# Patient Record
Sex: Male | Born: 1940
Health system: Southern US, Community
[De-identification: ages and names within clinical notes are randomized; demographics above are authoritative.]

## PROBLEM LIST (undated history)

## (undated) DIAGNOSIS — N400 Enlarged prostate without lower urinary tract symptoms: Secondary | ICD-10-CM

## (undated) DIAGNOSIS — I779 Disorder of arteries and arterioles, unspecified: Secondary | ICD-10-CM

## (undated) DIAGNOSIS — K635 Polyp of colon: Secondary | ICD-10-CM

## (undated) DIAGNOSIS — E782 Mixed hyperlipidemia: Secondary | ICD-10-CM

## (undated) DIAGNOSIS — I10 Essential (primary) hypertension: Secondary | ICD-10-CM

## (undated) DIAGNOSIS — I739 Peripheral vascular disease, unspecified: Secondary | ICD-10-CM

## (undated) DIAGNOSIS — K219 Gastro-esophageal reflux disease without esophagitis: Secondary | ICD-10-CM

## (undated) DIAGNOSIS — Z87442 Personal history of urinary calculi: Secondary | ICD-10-CM

## (undated) DIAGNOSIS — N402 Nodular prostate without lower urinary tract symptoms: Secondary | ICD-10-CM

## (undated) DIAGNOSIS — J449 Chronic obstructive pulmonary disease, unspecified: Secondary | ICD-10-CM

## (undated) DIAGNOSIS — E559 Vitamin D deficiency, unspecified: Secondary | ICD-10-CM

## (undated) DIAGNOSIS — M1990 Unspecified osteoarthritis, unspecified site: Secondary | ICD-10-CM

## (undated) DIAGNOSIS — I2510 Atherosclerotic heart disease of native coronary artery without angina pectoris: Secondary | ICD-10-CM

## (undated) DIAGNOSIS — G459 Transient cerebral ischemic attack, unspecified: Secondary | ICD-10-CM

## (undated) HISTORY — DX: Benign prostatic hyperplasia without lower urinary tract symptoms: N40.0

## (undated) HISTORY — DX: Essential (primary) hypertension: I10

## (undated) HISTORY — DX: Atherosclerotic heart disease of native coronary artery without angina pectoris: I25.10

## (undated) HISTORY — DX: Transient cerebral ischemic attack, unspecified: G45.9

## (undated) HISTORY — PX: EYE SURGERY: SHX253

## (undated) HISTORY — DX: Mixed hyperlipidemia: E78.2

## (undated) HISTORY — DX: Peripheral vascular disease, unspecified: I73.9

## (undated) HISTORY — DX: Nodular prostate without lower urinary tract symptoms: N40.2

## (undated) HISTORY — DX: Disorder of arteries and arterioles, unspecified: I77.9

## (undated) HISTORY — DX: Chronic obstructive pulmonary disease, unspecified: J44.9

## (undated) HISTORY — DX: Polyp of colon: K63.5

## (undated) HISTORY — DX: Vitamin D deficiency, unspecified: E55.9

## (undated) HISTORY — DX: Personal history of urinary calculi: Z87.442

---
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
## 1960-08-19 HISTORY — PX: OTHER SURGICAL HISTORY: SHX169

## 1966-08-19 HISTORY — PX: TONSILLECTOMY: SUR1361

## 1993-08-19 HISTORY — PX: ANGIOPLASTY: SHX39

## 2011-03-27 NOTE — Progress Notes (Signed)
[facility] [place]  New Carotid Patient  Referred by: Dr. Leodis Sias  Reason for referral: B carotid stenosis  History of Present Illness  Hector Torres is a 70 y.o. male who presents with chief complaint: B carotid stenosis.  Previous carotid studies demonstrated: RICA 50-69% stenosis, LICA 50-69% stenosis.  Patient has history of TIA 4-5 years ago notable for L hand weakness.  The patient has never  had amaurosis fugax or monocular blindness.  The patient has had transient right upper extremity plegia.  The patient has transient expressive aphasia.   The patient's previous neurologic deficits have resolved.  He denies any further episodes of this one event.  The patient denies any left arm complaints.  Past Medical History  Diagnosis Date  . Carotid stenosis, bilateral   . PVD (peripheral vascular disease)   . TIA (transient ischemic attack)   . Coronary artery disease   . Hypertension   . Hyperlipidemia   . Prostate nodule     Past Surgical History  Procedure Date  . Eye surgery     bilateral cataract  . Appendectomy 1947  . Tonsillectomy 1968  . Traumatic injury 1962    lost tip of index, middle, and ring finger/ right hand  . Balloon angioplasty, artery 1995    left circumflex artery     History   Social History  . Marital Status: Married    Spouse Name: N/A    Number of Children: N/A  . Years of Education: N/A   Occupational History  . Not on file.   Social History Main Topics  . Smoking status: Former Smoker -- 1.5 packs/day for 60 years    Types: Cigarettes    Quit date: 02/26/2011  . Smokeless tobacco: Not on file  . Alcohol Use: No  . Drug Use: Not on file  . Sexually Active: Not on file   Other Topics Concern  . Not on file   Social History Narrative  . No narrative on file    Family History  Problem Relation Age of Onset  . Cancer Mother     gastric  . Cancer Father     prostate  . Other Brother     carotid  artery stenosis  . Cancer Brother     leukemia  . Cancer Brother     hx of colon CA, and prostate Ca    Current outpatient prescriptions:acetaminophen (TYLENOL) 325 MG tablet, Take 650 mg by mouth every 6 (six) hours as needed.  , Disp: , Rfl: ;  aspirin 81 MG tablet, Take 81 mg by mouth daily.  , Disp: , Rfl: ;  carvedilol (COREG) 3.125 MG tablet, Take 3.125 mg by mouth 2 (two) times daily with a meal.  , Disp: , Rfl: ;  Cholecalciferol (VITAMIN D) 1000 UNITS capsule, Take 1,000 Units by mouth. Take 3 capsules daily , Disp: , Rfl:  Multiple Vitamin (MULTIVITAMIN PO), Take 1 tablet by mouth daily.  , Disp: , Rfl: ;  naproxen sodium (ANAPROX) 220 MG tablet, Take 220 mg by mouth. Take 2 tablets daily / as needed , Disp: , Rfl: ;  Omega-3 Fatty Acids (FISH OIL) 1000 MG CAPS, Take 1 capsule by mouth daily.  , Disp: , Rfl: ;  rosuvastatin (CRESTOR) 20 MG tablet, Take 20 mg by mouth daily.  , Disp: , Rfl: ;  vitamin E 400 UNIT capsule, Take 400 Units by mouth daily.  , Disp: , Rfl:  CRESTOR 10 MG  tablet, , Disp: , Rfl:   Allergies as of 03/29/2011  . (No Known Allergies)    Review of Systems (Positive items in bold and italic, otherwise negative)  General: Weight loss, Weight gain, Loss of appetite, Fever  Neurologic: Dizziness, Blackouts, Headaches, Seizure  Ear/Nose/Throat: Change in eyesight, Change in hearing, Nose bleeds, Sore throat  Vascular: Pain in legs with walking, Pain in feet while lying flat, Non-healing ulcer, Stroke, "Mini stroke", Slurred speech, Temporary blindness, Blood clot in vein, Phlebitis  Pulmonary: Home oxygen, Productive cough, Bronchitis, Coughing up blood, Asthma, Wheezing  Musculoskeletal: Arthritis, Joint pain, Muscle pain  Cardiac: Chest pain, Chest tightness/pressure, Shortness of breath when lying flat, Shortness of breath with exertion, Palpitations, Heart murmur, Arrythmia, Atrial fibrillation  Hematologic: Bleeding problems, Clotting disorder,  Anemia  Psychiatric:  Depression, Anxiety, Attention deficit disorder  Gastrointestinal:  Black stool, Blood in stool, Peptic ulcer disease, Reflux, Hiatal hernia, Trouble swallowing, Diarrhea, Constipation  Urinary:  Kidney disease, Burning with urination, Frequent urination, Difficulty urinating  Skin: Ulcers, Rashes  Physical Examination  Filed Vitals:   03/29/11 1212  BP: 179/89  Pulse:   Resp:    General: A&O x 3, WDWN  Head: [place]/AT  Ear/Nose/Throat: Hearing grossly intact, nares w/o erythema or drainage, oropharynx w/o Erythema/Exudate  Eyes: PERRLA, EOMI  Neck: Supple, no nuchal rigidity, no palpable LAD  Pulmonary: Sym exp, good air movt, CTAB, no rales, rhonchi, & wheezing  Cardiac: RRR, Nl S1, S2, no Murmurs, rubs or gallops  Vascular: Vessel Right Left  Radial Palpable Palpable  [place] Palpable Palpable  Brachial Palpable Palpable  Carotid Palpable, without bruit Palpable, without bruit  Aorta Non-palpable N/A  Femoral Palpable Palpable  Popliteal Non-palpable Non-palpable  PT Non-Palpable Non-Palpable  DP Weakly Palpable Weakly Palpable   Gastrointestinal: soft, NTND, -G/R, - HSM, - masses, - CVAT B  Musculoskeletal: M/S 5/5 throughout, Extremities without ischemic   Neurologic: CN 2-12 intact , Pain and light touch intact in extremities , Motor exam as listed above  Psychiatric: Judgment intact, Mood & affect appropriate for pt's clinical situation  Dermatologic: See M/S exam for extremity exam, no rashes otherwise noted  Lymph : No Cervical, Axillary, or Inguinal lymphadenopathy   Non-Invasive Vascular Imaging  CAROTID DUPLEX (Date: 03/29/11):   R ICA stenosis: 1-39%  R VA: patent and antegrade  L ICA stenosis: 40-59%  L VA:  patent and bidirectional  L SCA with 234 c/s velocity  Outside Studies/Documentation 10 pages of outside documents were reviewed including: carotid duplex report.  Medical Decision Making  Hector Torres is a  70 y.o. male who presents with: B ICA stenosis without evidence of L SCA steal.   Pt has not been sx for 4-5 years, so I would treat him as a patient with asx disease.  Neither side reaches the >80% threshold for repair.  I discussed in depth with the patient the nature of atherosclerosis, and emphasized the importance of maximal medical management including strict control of blood pressure, blood glucose, and lipid levels, obtaining regular exercise, and cessation of smoking.  The patient is aware that without maximal medical management the underlying atherosclerotic disease process will progress, limiting the benefit of any interventions.  I recommend annual B carotid duplex.  We will continue to follow this patient for his BICA stenosis.  Thank you for allowing [place] to participate in this patient's care.  Leonides Sake, MD [facility] [place] Office: [phone] Pager: [phone]

## 2011-03-29 ENCOUNTER — Encounter: Payer: Self-pay | Admitting: Vascular Surgery

## 2011-03-29 ENCOUNTER — Ambulatory Visit (INDEPENDENT_AMBULATORY_CARE_PROVIDER_SITE_OTHER): Payer: Medicare PPO | Admitting: Vascular Surgery

## 2011-03-29 ENCOUNTER — Other Ambulatory Visit (INDEPENDENT_AMBULATORY_CARE_PROVIDER_SITE_OTHER): Payer: Medicare PPO

## 2011-03-29 VITALS — BP 179/89 | HR 60 | Resp 20 | Ht 70.0 in | Wt 210.2 lb

## 2011-03-29 DIAGNOSIS — I6529 Occlusion and stenosis of unspecified carotid artery: Secondary | ICD-10-CM

## 2011-03-29 DIAGNOSIS — I6523 Occlusion and stenosis of bilateral carotid arteries: Secondary | ICD-10-CM

## 2011-04-12 NOTE — Procedures (Unsigned)
CAROTID DUPLEX EXAM  INDICATION:  Carotid disease.  HISTORY: Diabetes:  No. Cardiac:  No. Hypertension:  Yes. Smoking:  Previous. Previous Surgery:  No. CV History:  Currently asymptomatic. Amaurosis Fugax No, Paresthesias No, Hemiparesis No                                      RIGHT             LEFT Brachial systolic pressure:         182               162 Brachial Doppler waveforms:         Normal            Abnormal Vertebral direction of flow:        Antegrade         Bidirectional DUPLEX VELOCITIES (cm/sec) CCA peak systolic                   54                57 ECA peak systolic                   83                70 ICA peak systolic                   120               167 ICA end diastolic                   34                50 PLAQUE MORPHOLOGY:                  Heterogeneous     Heterogeneous PLAQUE AMOUNT:                      Mild              Moderate PLAQUE LOCATION:                    ICA / ECA         ICA / ECA / CCA  IMPRESSION: 1. Doppler velocities suggest a 1%-39% stenosis of the right proximal     internal carotid artery and 40%-59% stenosis of the left proximal     internal carotid artery. 2. Bidirectional left vertebral artery flow and velocity of 234 cm/s     of the left proximal subclavian artery are noted.  ___________________________________________ Fransisco Hertz, MD  CH/MEDQ  D:  03/29/2011  T:  03/29/2011  Job:  [id number]

## 2011-04-15 ENCOUNTER — Encounter: Payer: Self-pay | Admitting: Cardiology

## 2011-04-15 ENCOUNTER — Ambulatory Visit (INDEPENDENT_AMBULATORY_CARE_PROVIDER_SITE_OTHER): Payer: Medicare PPO | Admitting: Cardiology

## 2011-04-15 ENCOUNTER — Telehealth: Payer: Self-pay | Admitting: *Deleted

## 2011-04-15 ENCOUNTER — Encounter: Payer: Self-pay | Admitting: *Deleted

## 2011-04-15 VITALS — BP 177/96 | HR 67 | Ht 70.0 in | Wt 212.0 lb

## 2011-04-15 DIAGNOSIS — F17200 Nicotine dependence, unspecified, uncomplicated: Secondary | ICD-10-CM

## 2011-04-15 DIAGNOSIS — I10 Essential (primary) hypertension: Secondary | ICD-10-CM

## 2011-04-15 DIAGNOSIS — R011 Cardiac murmur, unspecified: Secondary | ICD-10-CM

## 2011-04-15 DIAGNOSIS — E782 Mixed hyperlipidemia: Secondary | ICD-10-CM

## 2011-04-15 DIAGNOSIS — I779 Disorder of arteries and arterioles, unspecified: Secondary | ICD-10-CM

## 2011-04-15 DIAGNOSIS — R0602 Shortness of breath: Secondary | ICD-10-CM

## 2011-04-15 DIAGNOSIS — Z720 Tobacco use: Secondary | ICD-10-CM

## 2011-04-15 DIAGNOSIS — I2510 Atherosclerotic heart disease of native coronary artery without angina pectoris: Secondary | ICD-10-CM

## 2011-04-15 NOTE — Assessment & Plan Note (Signed)
Suspect this might be white coat hypertension, although needs to be followed. We discussed sodium restriction, and he will continue on Coreg for now. I asked him to bring in a record of blood pressure at home prior to his next visit, also consider doing the same when he presents for primary care followup.

## 2011-04-15 NOTE — Patient Instructions (Signed)
Your physician wants you to follow-up in: 6 months. You will receive a reminder letter in the mail one-two months in advance. If you don't receive a letter, please call our office to schedule the follow-up appointment. Your physician recommends that you continue on your current medications as directed. Please refer to the Current Medication list given to you today. Your physician has requested that you have an echocardiogram. Echocardiography is a painless test that uses sound waves to create images of your heart. It provides your doctor with information about the size and shape of your heart and how well your heart's chambers and valves are working. This procedure takes approximately one hour. There are no restrictions for this procedure. Your physician has requested that you have en exercise stress cardiolite. For further information please visit [email or web address] Please follow instruction sheet, as given. If the results of your test are normal or stable, you will receive a letter. If they are abnormal, the nurse will contact you by phone.

## 2011-04-15 NOTE — Telephone Encounter (Signed)
Auth # [id number]  Expires 05/14/11

## 2011-04-15 NOTE — Assessment & Plan Note (Signed)
Possibly subclavian bruit, although need to rule out aortic disease as well. Echocardiogram will be obtained.

## 2011-04-15 NOTE — Telephone Encounter (Signed)
Stress Cardiolite set for Friday, April 19, 2011 @ [facility] Checking percert

## 2011-04-15 NOTE — Assessment & Plan Note (Signed)
History of angioplasty to the circumflex at [facility] back in 1995. Baseline ECG is abnormal, nonspecific overall. He does report dyspnea on exertion, no obvious angina. No ischemic testing in greater than 10 years. Plan will be followup exercise Myoview to assess ischemic burden, and help guide future therapy. Regular followup will be arranged.

## 2011-04-15 NOTE — Assessment & Plan Note (Signed)
States he quit smoking in July.

## 2011-04-15 NOTE — Assessment & Plan Note (Signed)
Followed by vascular surgery in [place] at this point.

## 2011-04-15 NOTE — Progress Notes (Signed)
Clinical Summary Hector Torres is a 70 y.o.male referred for cardiology consultation by Dr. Modesto Charon. He reports a history of CAD and remote angioplasty to the circumflex at [facility] back in 1995, in the setting of angina. No reported history of infarct.  He states that he has done quite well over the years, no active angina at present, NYHA class II dyspnea on exertion, sometimes worse with upper limit exertion. He has had no followup ischemic testing within the last 10 years.  Also referred with hypertension, although it may well be white coat hypertension based on description. He reports that he checks it at home regularly, typically systolics running in the 105-120 range. He states that in fact, he was placed on ACE inhibitor previously due to elevated blood pressure in the office, and subsequently developed dizziness with low blood pressure, and had to stop the medication. He seems to be tolerating Coreg. We discussed sodium restriction and diet. Also providing a record of home blood pressures for his next visit, bringing in his monitor for a check.  He reports compliance with medications for hyperlipidemia. Also recently established for followup of his carotid artery disease. Most recent Dopplers are noted below. He does not report any claudication symptoms, neither upper extremity nor lower extremity..   No Known Allergies  Medication list reviewed.  Past Medical History  Diagnosis Date  . Carotid artery disease     Bilateral - Dr. Imogene Burn  . PVD (peripheral vascular disease)   . TIA (transient ischemic attack)   . Coronary atherosclerosis of native coronary artery      PTCA circumflex at [facility]  . Essential hypertension, benign     Possible white coat  . Mixed hyperlipidemia   . Prostate nodule     Past Surgical History  Procedure Date  . Eye surgery     Bilateral cataract  . Appendectomy 1947  . Tonsillectomy 1968  . Traumatic injury 1962    Lost tip of index, middle, and ring finger/  right hand    Family History  Problem Relation Age of Onset  . Cancer Mother     Gastric  . Cancer Father     prostate  . Other Brother     carotid artery stenosis  . Cancer Brother     Leukemia  . Cancer Brother     Colon CA and prostate CA    Social History Mr. Pavlak reports that he quit smoking about 6 weeks ago. His smoking use included Cigarettes. He has a 90 pack-year smoking history. He has never used smokeless tobacco. Mr. Curto reports that he does not drink alcohol.  Review of Systems No palpitations, dizziness, syncope. No reported bleeding problems. Reports stable appetite. No melena or hematochezia. Otherwise negative.  Physical Examination Filed Vitals:   04/15/11 1316  BP: 177/96  Pulse: 67  Overweight male in no acute distress. HEENT: Conjunctiva and lids are normal, oropharynx clear. Neck: Supple, no significant bruits, no thyromegaly. Lungs: Decreased breath sounds, nonlabored. Cardiac: Regular rate and rhythm with 2/6 systolic murmur at the base, also possible bruit in the right subclavian distribution, high-pitched. No S3 gallop or rub. Abdomen: Soft, nontender, bowel sounds present. Skin: Warm and dry. Musculoskeletal: No kyphosis. Extremities: No pitting edema, distal pulses 1+ to 2+. Neuropsychiatric: Alert and oriented x3, affect appropriate.   ECG Reviewed in EMR.  Studies Carotid Dopplers 03/29/2011: 1. Doppler velocities suggest a 1%-39% stenosis of the right proximal  internal carotid artery and 40%-59% stenosis of the  left proximal  internal carotid artery.  2. Bidirectional left vertebral artery flow and velocity of 234 cm/s  of the left proximal subclavian artery are noted.  Problem List and Plan

## 2011-04-15 NOTE — Assessment & Plan Note (Signed)
Recommend aggressive LDL control close to 70. Continue statin therapy.

## 2011-04-18 ENCOUNTER — Other Ambulatory Visit: Payer: Self-pay | Admitting: *Deleted

## 2011-04-18 ENCOUNTER — Other Ambulatory Visit (INDEPENDENT_AMBULATORY_CARE_PROVIDER_SITE_OTHER): Payer: Medicare PPO | Admitting: *Deleted

## 2011-04-18 DIAGNOSIS — R011 Cardiac murmur, unspecified: Secondary | ICD-10-CM

## 2011-04-18 DIAGNOSIS — I2510 Atherosclerotic heart disease of native coronary artery without angina pectoris: Secondary | ICD-10-CM

## 2011-04-19 ENCOUNTER — Encounter: Payer: Self-pay | Admitting: *Deleted

## 2011-04-19 DIAGNOSIS — R0602 Shortness of breath: Secondary | ICD-10-CM

## 2011-04-24 ENCOUNTER — Telehealth: Payer: Self-pay | Admitting: *Deleted

## 2011-04-24 NOTE — Telephone Encounter (Signed)
Pt notified of results and verbalized understanding  

## 2011-04-24 NOTE — Telephone Encounter (Signed)
Message copied by Arlyss Gandy on Wed Apr 24, 2011  2:37 PM ------      Message from: Jonelle Sidle      Created: Tue Apr 23, 2011  2:35 PM       Reviewed report. LVEF is normal overall, inferior wall defect described as consistent with attenuation, although recent echocardiogram showed inferolateral hypokinesis suggesting that this might be scar. No large ischemic territories. In light of clinical stability, will recommend medical therapy and office followup for now.

## 2012-04-02 ENCOUNTER — Encounter: Payer: Self-pay | Admitting: Neurosurgery

## 2012-04-03 ENCOUNTER — Ambulatory Visit (INDEPENDENT_AMBULATORY_CARE_PROVIDER_SITE_OTHER): Payer: Medicare PPO | Admitting: Neurosurgery

## 2012-04-03 ENCOUNTER — Encounter: Payer: Self-pay | Admitting: Neurosurgery

## 2012-04-03 ENCOUNTER — Ambulatory Visit (INDEPENDENT_AMBULATORY_CARE_PROVIDER_SITE_OTHER): Payer: Medicare PPO | Admitting: Vascular Surgery

## 2012-04-03 VITALS — BP 172/94 | HR 56 | Resp 14 | Ht 70.0 in | Wt 207.0 lb

## 2012-04-03 DIAGNOSIS — I6523 Occlusion and stenosis of bilateral carotid arteries: Secondary | ICD-10-CM | POA: Insufficient documentation

## 2012-04-03 DIAGNOSIS — I6529 Occlusion and stenosis of unspecified carotid artery: Secondary | ICD-10-CM

## 2012-04-03 DIAGNOSIS — G458 Other transient cerebral ischemic attacks and related syndromes: Secondary | ICD-10-CM

## 2012-04-03 NOTE — Addendum Note (Signed)
Addended by: Sharee Pimple on: 04/03/2012 03:46 PM   Modules accepted: Orders

## 2012-04-03 NOTE — Progress Notes (Signed)
[facility] [place] Carotid Office Note  CC: Annual carotid surveillance Referring Physician: Imogene Burn  History of Present Illness: 71 year old male patient of Dr. Imogene Burn followed for mild bilateral carotid stenosis. The patient denies any signs or symptoms of CVA, TIA, amaurosis fugax or any neural deficit. The patient denies any new medical diagnoses or recent surgeries.  Past Medical History  Diagnosis Date  . Carotid artery disease     Bilateral - Dr. Imogene Burn  . PVD (peripheral vascular disease)   . TIA (transient ischemic attack)   . Coronary atherosclerosis of native coronary artery      PTCA circumflex at [facility]  . Essential hypertension, benign     Possible white coat  . Mixed hyperlipidemia   . Prostate nodule     ROS: [x]  Positive   [ ]  Denies    General: [ ]  Weight loss, [ ]  Fever, [ ]  chills Neurologic: [ ]  Dizziness, [ ]  Blackouts, [ ]  Seizure [ ]  Stroke, [ ]  "Mini stroke", [ ]  Slurred speech, [ ]  Temporary blindness; [ ]  weakness in arms or legs, [ ]  Hoarseness Cardiac: [ ]  Chest pain/pressure, [ ]  Shortness of breath at rest [ ]  Shortness of breath with exertion, [ ]  Atrial fibrillation or irregular heartbeat Vascular: [ ]  Pain in legs with walking, [ ]  Pain in legs at rest, [ ]  Pain in legs at night,  [ ]  Non-healing ulcer, [ ]  Blood clot in vein/DVT,   Pulmonary: [ ]  Home oxygen, [ ]  Productive cough, [ ]  Coughing up blood, [ ]  Asthma,  [ ]  Wheezing Musculoskeletal:  [ ]  Arthritis, [ ]  Low back pain, [ ]  Joint pain Hematologic: [ ]  Easy Bruising, [ ]  Anemia; [ ]  Hepatitis Gastrointestinal: [ ]  Blood in stool, [ ]  Gastroesophageal Reflux/heartburn, [ ]  Trouble swallowing Urinary: [ ]  chronic Kidney disease, [ ]  on HD - [ ]  MWF or [ ]  TTHS, [ ]  Burning with urination, [ ]  Difficulty urinating Skin: [ ]  Rashes, [ ]  Wounds Psychological: [ ]  Anxiety, [ ]  Depression   Social History History  Substance Use Topics  . Smoking status: Former Smoker  -- 1.5 packs/day for 60 years    Types: Cigarettes    Quit date: 02/27/2011  . Smokeless tobacco: Never Used  . Alcohol Use: No    Family History Family History  Problem Relation Age of Onset  . Cancer Mother     Gastric  . Cancer Father     prostate  . Other Brother     carotid artery stenosis  . Cancer Brother     Leukemia  . Cancer Brother     Colon CA and prostate CA    No Known Allergies  Current Outpatient Prescriptions  Medication Sig Dispense Refill  . acetaminophen (TYLENOL) 325 MG tablet Take 650 mg by mouth every 6 (six) hours as needed.        Marland Kitchen aspirin 81 MG tablet Take 81 mg by mouth daily.        . carvedilol (COREG) 3.125 MG tablet Take 3.125 mg by mouth 2 (two) times daily with a meal.        . Cholecalciferol (VITAMIN D) 1000 UNITS capsule Take 1,000 Units by mouth. Take 3 capsules daily       . Multiple Vitamin (MULTIVITAMIN PO) Take 1 tablet by mouth daily.        . Omega-3 Fatty Acids (FISH OIL) 1000 MG CAPS Take 1 capsule by  mouth daily.        . rosuvastatin (CRESTOR) 20 MG tablet Take 20 mg by mouth daily.        . vitamin E 400 UNIT capsule Take 400 Units by mouth daily.        . naproxen sodium (ANAPROX) 220 MG tablet Take 220 mg by mouth. Take 2 tablets daily / as needed         Physical Examination  Filed Vitals:   04/03/12 1446  BP: 172/94  Pulse: 56  Resp:     Body mass index is 29.70 kg/(m^2).  General:  WDWN in NAD Gait: Normal HEENT: WNL Eyes: Pupils equal Pulmonary: normal non-labored breathing , without Rales, rhonchi,  wheezing Cardiac: RRR, without  Murmurs, rubs or gallops; Abdomen: soft, NT, no masses Skin: no rashes, ulcers noted  Vascular Exam Pulses: 3+ radial pulses bilaterally Carotid bruits: Carotid pulses to auscultation no bruits are heard Extremities without ischemic changes, no Gangrene , no cellulitis; no open wounds;  Musculoskeletal: no muscle wasting or atrophy   Neurologic: A&O X 3; Appropriate Affect  ; SENSATION: normal; MOTOR FUNCTION:  moving all extremities equally. Speech is fluent/normal  Non-Invasive Vascular Imaging CAROTID DUPLEX 04/03/2012  Right ICA 20 - 39 % stenosis Left ICA 40 - 59 % stenosis   ASSESSMENT/PLAN: Asymptomatic patient with mild bilateral carotid stenosis with left greater than right, the patient will followup in one year with repeat carotid duplex, his questions were encouraged and answered, he is in agreement with this plan.  Lauree Chandler ANP   Clinic MD: Imogene Burn

## 2012-08-02 ENCOUNTER — Encounter: Payer: Self-pay | Admitting: Cardiology

## 2012-08-04 ENCOUNTER — Ambulatory Visit: Payer: Medicare PPO | Admitting: Cardiology

## 2012-10-29 ENCOUNTER — Encounter: Payer: Self-pay | Admitting: Cardiology

## 2012-10-29 ENCOUNTER — Ambulatory Visit (INDEPENDENT_AMBULATORY_CARE_PROVIDER_SITE_OTHER): Payer: Medicare PPO | Admitting: Cardiology

## 2012-10-29 VITALS — BP 159/84 | HR 67 | Ht 70.0 in | Wt 207.4 lb

## 2012-10-29 DIAGNOSIS — I2510 Atherosclerotic heart disease of native coronary artery without angina pectoris: Secondary | ICD-10-CM

## 2012-10-29 DIAGNOSIS — I10 Essential (primary) hypertension: Secondary | ICD-10-CM

## 2012-10-29 DIAGNOSIS — E782 Mixed hyperlipidemia: Secondary | ICD-10-CM

## 2012-10-29 NOTE — Assessment & Plan Note (Signed)
Continues on statin therapy, followed by Dr. Modesto Charon. Goal LDL should be close to 70 if possible.

## 2012-10-29 NOTE — Assessment & Plan Note (Signed)
Carotid artery disease followed by Dr. Imogene Burn. Patient also has subclavian bruit on examination.

## 2012-10-29 NOTE — Progress Notes (Signed)
Clinical Summary Mr. Easler is a 72 y.o.male last seen in August 2012. He states that he has done well from a cardiac perspective. Had a few episodes of chest discomfort in the evening several months ago, no recurrence sense, no exertional symptoms now using the treadmill or playing golf.  Echocardiogram in August 2012 demonstrated mild to moderate LVH with LVEF 55-60%, grade 1 diastolic dysfunction, inferolateral wall and apical hypokinesis, mildly thickened aortic leaflets without stenosis. Myoview study also done at that time demonstrated inferior wall defect suggesting either attenuation versus scar, no ischemia, medical therapy was recommended.  Lab work from February 2013 revealed LDL 61, HDL 43, triglycerides 113, cholesterol 127. He remains on statin therapy, follows with Dr. Modesto Charon.  Carotids are followed by vascular surgery in [place].  ECG today shows sinus rhythm with nonspecific inferolateral ST-T wave changes, no significant change compared to previous tracing.  No Known Allergies  Current Outpatient Prescriptions  Medication Sig Dispense Refill  . acetaminophen (TYLENOL) 325 MG tablet Take 650 mg by mouth every 6 (six) hours as needed.        Marland Kitchen aspirin 81 MG tablet Take 81 mg by mouth daily.        . carvedilol (COREG) 3.125 MG tablet Take 3.125 mg by mouth 2 (two) times daily with a meal.        . Cholecalciferol (VITAMIN D) 1000 UNITS capsule Take 5,000 Units by mouth 2 (two) times daily. Take 3 capsules daily      . rosuvastatin (CRESTOR) 20 MG tablet Take 20 mg by mouth daily.        . Sulfamethoxazole-Trimethoprim (SULFAMETHOXAZOLE-TMP DS PO) Take 1 tablet by mouth 2 (two) times daily.      . tamsulosin (FLOMAX) 0.4 MG CAPS Take 0.4 mg by mouth 2 (two) times daily.      . vitamin E 400 UNIT capsule Take 400 Units by mouth daily.        . Multiple Vitamin (MULTIVITAMIN PO) Take 1 tablet by mouth daily.        . naproxen sodium (ANAPROX) 220 MG tablet Take 220 mg by  mouth. Take 2 tablets daily / as needed       . Omega-3 Fatty Acids (FISH OIL) 1000 MG CAPS Take 1 capsule by mouth daily.         No current facility-administered medications for this visit.    Past Medical History  Diagnosis Date  . Carotid artery disease     Bilateral - Dr. Imogene Burn  . PVD (peripheral vascular disease)   . TIA (transient ischemic attack)   . Coronary atherosclerosis of native coronary artery     PTCA circumflex at [facility]  . Essential hypertension, benign     Possible white coat  . Mixed hyperlipidemia   . Prostate nodule     Social History Mr. Kitamura reports that he quit smoking about 20 months ago. His smoking use included Cigarettes. He has a 90 pack-year smoking history. He has never used smokeless tobacco. Mr. Adamczak reports that he does not drink alcohol.  Review of Systems No palpitations, focal motor weakness, speech deficits. No claudication. No bleeding problems. States he has had some prostate problems, also had polyps removed via colonoscopy. Otherwise negative.  Physical Examination Filed Vitals:   10/29/12 1109  BP: 159/84  Pulse: 67   Filed Weights   10/29/12 1109  Weight: 207 lb 6.4 oz (94.076 kg)    Overweight male in no acute distress.  HEENT: Conjunctiva and lids are normal, oropharynx clear.  Neck: Supple, bilateral carotid bruits, no thyromegaly.  Lungs: Decreased breath sounds, nonlabored.  Cardiac: Regular rate and rhythm with 2/6 systolic murmur at the base, also possible bruit in the subclavian distribution, high-pitched. No S3 gallop or rub.  Abdomen: Soft, nontender, bowel sounds present.  Skin: Warm and dry.  Musculoskeletal: No kyphosis.  Extremities: No pitting edema, distal pulses 1+ to 2+.  Neuropsychiatric: Alert and oriented x3, affect appropriate.   Problem List and Plan   Coronary atherosclerosis of native coronary artery No progressive angina symptoms on medical therapy. ECG reviewed. Ischemic evaluation from  August 2012 reviewed. Continue observation.  Mixed hyperlipidemia Continues on statin therapy, followed by Dr. Modesto Charon. Goal LDL should be close to 70 if possible.  Occlusion and stenosis of carotid artery without mention of cerebral infarction Carotid artery disease followed by Dr. Imogene Burn. Patient also has subclavian bruit on examination.  Essential hypertension, benign To continue medical therapy. Also history of whitecoat hypertension.    Jonelle Sidle, M.D., F.A.C.C.

## 2012-10-29 NOTE — Assessment & Plan Note (Signed)
To continue medical therapy. Also history of whitecoat hypertension.

## 2012-10-29 NOTE — Patient Instructions (Addendum)
Continue all current medications. Your physician wants you to follow up in:  1 year.  You will receive a reminder letter in the mail one-two months in advance.  If you don't receive a letter, please call our office to schedule the follow up appointment   

## 2012-10-29 NOTE — Assessment & Plan Note (Signed)
No progressive angina symptoms on medical therapy. ECG reviewed. Ischemic evaluation from August 2012 reviewed. Continue observation.

## 2012-11-04 ENCOUNTER — Other Ambulatory Visit: Payer: Self-pay | Admitting: *Deleted

## 2012-11-05 ENCOUNTER — Other Ambulatory Visit: Payer: Self-pay | Admitting: Family Medicine

## 2012-11-05 MED ORDER — CARVEDILOL 3.125 MG PO TABS: 3.1250 mg | ORAL_TABLET | Freq: Two times a day (BID) | ORAL | Status: DC

## 2012-11-05 NOTE — Telephone Encounter (Signed)
refilled 

## 2012-12-25 ENCOUNTER — Encounter: Payer: Self-pay | Admitting: Family Medicine

## 2012-12-25 ENCOUNTER — Ambulatory Visit (INDEPENDENT_AMBULATORY_CARE_PROVIDER_SITE_OTHER): Payer: Medicare HMO | Admitting: Family Medicine

## 2012-12-25 VITALS — BP 149/86 | HR 66 | Temp 98.1°F | Wt 205.8 lb

## 2012-12-25 DIAGNOSIS — I10 Essential (primary) hypertension: Secondary | ICD-10-CM

## 2012-12-25 DIAGNOSIS — I2510 Atherosclerotic heart disease of native coronary artery without angina pectoris: Secondary | ICD-10-CM

## 2012-12-25 DIAGNOSIS — E785 Hyperlipidemia, unspecified: Secondary | ICD-10-CM

## 2012-12-25 DIAGNOSIS — Z125 Encounter for screening for malignant neoplasm of prostate: Secondary | ICD-10-CM | POA: Insufficient documentation

## 2012-12-25 DIAGNOSIS — I1A0 Resistant hypertension: Secondary | ICD-10-CM | POA: Insufficient documentation

## 2012-12-25 LAB — PSA: PSA: 0.29 ng/mL (ref <=4.00)

## 2012-12-25 MED ORDER — CARVEDILOL 3.125 MG PO TABS: 3.1250 mg | ORAL_TABLET | Freq: Two times a day (BID) | ORAL | Status: DC

## 2012-12-25 MED ORDER — ROSUVASTATIN CALCIUM 20 MG PO TABS: 20.0000 mg | ORAL_TABLET | Freq: Every day | ORAL | Status: DC

## 2012-12-25 MED ORDER — CHLORTHALIDONE 25 MG PO TABS: 12.5000 mg | ORAL_TABLET | Freq: Every day | ORAL | Status: DC

## 2012-12-25 NOTE — Progress Notes (Signed)
Patient ID: Hector Torres, male   DOB: [birth year], 72 y.o.   MRN: [id number] SUBJECTIVE: HPI: Patient is here for follow up of CAD/Hypertension/ hyperlipidemia:  denies Headache;denies Chest Pain;denies weakness;denies Shortness of Breath and orthopnea;denies Visual changes;denies palpitations;denies cough;denies pedal edema;denies symptoms of TIA or stroke;deniesClaudication symptoms. admits to Compliance with medications; denies Problems with medications.  PMH/PSH: reviewed/updated in Epic  SH/FH: reviewed/updated in Epic  Allergies: reviewed/updated in Epic  Medications: reviewed/updated in Epic  Immunizations: reviewed/updated in Epic  ROS: As above in the HPI. All other systems are stable or negative.  OBJECTIVE: APPEARANCE:  Patient in no acute distress.The patient appeared well nourished and normally developed. Acyanotic. Waist: VITAL SIGNS:BP 149/86  Pulse 66  Temp(Src) 98.1 F (36.7 C) (Oral)  Wt 205 lb 12.8 oz (93.35 kg)  BMI 29.53 kg/m2 WM looks well  SKIN: warm and  Dry without overt rashes, tattoos and scars  HEAD and Neck: without JVD, Head and scalp: normal Eyes:No scleral icterus. Fundi normal, eye movements normal. Ears: Auricle normal, canal normal, Tympanic membranes normal, insufflation normal. Nose: normal Throat: normal Neck & thyroid: normal  CHEST & LUNGS: Chest wall: normal Lungs: Clear  CVS: Reveals the PMI to be normally located. Regular rhythm, First and Second Heart sounds are normal,  absence of murmurs, rubs or gallops. Peripheral vasculature: Radial pulses: normal Dorsal pedis pulses: normal Posterior pulses: normal  ABDOMEN:  Appearance: normal Benign,, no organomegaly, no masses, no Abdominal Aortic enlargement. No Guarding , no rebound. No Bruits. Bowel sounds: normal  RECTAL: N/A GU: N/A  EXTREMETIES: nonedematous. Both Femoral and Pedal pulses are normal.  MUSCULOSKELETAL:  Spine: normal Joints:  intact  NEUROLOGIC: oriented to time,place and person; nonfocal. Strength is normal Sensory is normal Reflexes are normal Cranial Nerves are normal.  ASSESSMENT: HTN (hypertension) - Plan: COMPLETE METABOLIC PANEL WITH GFR, chlorthalidone (HYGROTON) 25 MG tablet, carvedilol (COREG) 3.125 MG tablet  HLD (hyperlipidemia) - Plan: COMPLETE METABOLIC PANEL WITH GFR, NMR Lipoprofile with Lipids, rosuvastatin (CRESTOR) 20 MG tablet  CAD (coronary artery disease) - Plan: COMPLETE METABOLIC PANEL WITH GFR, NMR Lipoprofile with Lipids, carvedilol (COREG) 3.125 MG tablet  Screening for prostate cancer - Plan: PSA  PLAN: Orders Placed This Encounter  Procedures  . COMPLETE METABOLIC PANEL WITH GFR  . NMR Lipoprofile with Lipids  . PSA   No results found for this or any previous visit. Meds ordered this encounter  Medications  . chlorthalidone (HYGROTON) 25 MG tablet    Sig: Take 0.5 tablets (12.5 mg total) by mouth daily.    Dispense:  30 tablet    Refill:  5  . carvedilol (COREG) 3.125 MG tablet    Sig: Take 1 tablet (3.125 mg total) by mouth 2 (two) times daily with a meal.    Dispense:  60 tablet    Refill:  5  . rosuvastatin (CRESTOR) 20 MG tablet    Sig: Take 1 tablet (20 mg total) by mouth daily.    Dispense:  30 tablet    Refill:  5   RTC 6 weeks to recheck the BP Added Chlorthalidone 1/2 tab qd of the 25 mg  Malaak Stach P. Modesto Charon, M.D.

## 2012-12-26 LAB — COMPLETE METABOLIC PANEL WITH GFR
ALT: 11 U/L (ref 0–53)
AST: 15 U/L (ref 0–37)
Albumin: 3.8 g/dL (ref 3.5–5.2)
Alkaline Phosphatase: 57 U/L (ref 39–117)
BUN: 13 mg/dL (ref 6–23)
CO2: 30 mEq/L (ref 19–32)
Calcium: 9.4 mg/dL (ref 8.4–10.5)
Chloride: 105 mEq/L (ref 96–112)
Creat: 1.08 mg/dL (ref 0.50–1.35)
GFR, Est African American: 79 mL/min
GFR, Est Non African American: 68 mL/min
Glucose, Bld: 89 mg/dL (ref 70–99)
Potassium: 4.8 mEq/L (ref 3.5–5.3)
Sodium: 139 mEq/L (ref 135–145)
Total Bilirubin: 0.5 mg/dL (ref 0.3–1.2)
Total Protein: 6.2 g/dL (ref 6.0–8.3)

## 2012-12-29 LAB — NMR LIPOPROFILE WITH LIPIDS
Cholesterol, Total: 102 mg/dL (ref <200)
HDL Particle Number: 30.2 umol/L — ABNORMAL LOW (ref >=30.5)
HDL Size: 9.3 nm (ref >=9.2)
HDL-C: 36 mg/dL — ABNORMAL LOW (ref >=40)
LDL (calc): 50 mg/dL (ref <100)
LDL Particle Number: 624 nmol/L (ref <1000)
LDL Size: 20 nm — ABNORMAL LOW (ref >20.5)
LP-IR Score: 44 (ref <=45)
Large HDL-P: 4.9 umol/L (ref >=4.8)
Large VLDL-P: 2.5 nmol/L (ref <=2.7)
Small LDL Particle Number: 443 nmol/L (ref <=527)
Triglycerides: 82 mg/dL (ref <150)
VLDL Size: 45.4 nm (ref <=46.6)

## 2012-12-29 NOTE — Progress Notes (Signed)
Quick Note:  Lab result at goal. No change in Medications for now. No Change in plans and follow up. ______ 

## 2012-12-30 ENCOUNTER — Telehealth: Payer: Self-pay

## 2012-12-30 NOTE — Telephone Encounter (Signed)
FYI Pt called to report the "new BP med" that was added to his regimen he took Saturday and Sunday and on Monday he felt like his was going to pass out. Took last night and he felt fine this am PT ADVISED TO CALL AND LET us KNOW IF THIS OCCURS AGAIN-- HE VERBALIZED UNDERSTANDING.   DR Melodie Bouillon.

## 2013-02-03 ENCOUNTER — Encounter: Payer: Self-pay | Admitting: Family Medicine

## 2013-02-03 ENCOUNTER — Ambulatory Visit (INDEPENDENT_AMBULATORY_CARE_PROVIDER_SITE_OTHER): Payer: Medicare HMO | Admitting: Family Medicine

## 2013-02-03 VITALS — BP 117/72 | HR 71 | Temp 97.1°F | Wt 205.8 lb

## 2013-02-03 DIAGNOSIS — I2510 Atherosclerotic heart disease of native coronary artery without angina pectoris: Secondary | ICD-10-CM

## 2013-02-03 DIAGNOSIS — I10 Essential (primary) hypertension: Secondary | ICD-10-CM

## 2013-02-03 DIAGNOSIS — E785 Hyperlipidemia, unspecified: Secondary | ICD-10-CM

## 2013-02-03 NOTE — Patient Instructions (Signed)
      Dr Jadaya Sommerfield's Recommendations  Diet and Exercise discussed with patient.  For nutrition information, I recommend books:  1).Eat to Live by Dr Joel Fuhrman. 2).Prevent and Reverse Heart Disease by Dr Caldwell Esselstyn. 3) Dr Neal Barnard's Book:  Program to Reverse Diabetes  Exercise recommendations are:  If unable to walk, then the patient can exercise in a chair 3 times a day. By flapping arms like a bird gently and raising legs outwards to the front.  If ambulatory, the patient can go for walks for 30 minutes 3 times a week. Then increase the intensity and duration as tolerated.  Goal is to try to attain exercise frequency to 5 times a week.  If applicable: Best to perform resistance exercises (machines or weights) 2 days a week and cardio type exercises 3 days per week.  

## 2013-02-03 NOTE — Progress Notes (Signed)
Patient ID: Hector Torres, male   DOB: 19-Mar-1941, 72 y.o.   MRN: [id number] SUBJECTIVE: CC: Chief Complaint  Patient presents with  . Follow-up    follow up 6 weeks  no  complaints discuss hycholorthiadone    HPI: Patient is here for follow up of hypertension/CAD/HLD He was seen last month and BP was found to be  Elevated and a diuretic was added  denies Headache;deniesChest Pain;denies weakness;denies Shortness of Breath or Orthopnea;denies Visual changes;denies palpitations;denies cough;denies pedal edema;denies symptoms of TIA or stroke; admits to Compliance with medications. Problems with medications.: BP went below 115 sytolic and he  Was weak and  Dizzy. He stopped the chlorthalidone and he is fine. BP runs 117 systolic. Doing fine.  PMH/PSH: reviewed/updated in Epic  SH/FH: reviewed/updated in Epic  Allergies: reviewed/updated in Epic  Medications: reviewed/updated in Epic  Immunizations: reviewed/updated in Epic  ROS: As above in the HPI. All other systems are stable or negative.  Results for orders placed in visit on 12/25/12  COMPLETE METABOLIC PANEL WITH GFR      Result Value Range   Sodium 139  135 - 145 mEq/L   Potassium 4.8  3.5 - 5.3 mEq/L   Chloride 105  96 - 112 mEq/L   CO2 30  19 - 32 mEq/L   Glucose, Bld 89  70 - 99 mg/dL   BUN 13  6 - 23 mg/dL   Creat 1.91  4.78 - 2.95 mg/dL   Total Bilirubin 0.5  0.3 - 1.2 mg/dL   Alkaline Phosphatase 57  39 - 117 U/L   AST 15  0 - 37 U/L   ALT 11  0 - 53 U/L   Total Protein 6.2  6.0 - 8.3 g/dL   Albumin 3.8  3.5 - 5.2 g/dL   Calcium 9.4  8.4 - 62.1 mg/dL   GFR, Est African American 79     GFR, Est Non African American 68    NMR LIPOPROFILE WITH LIPIDS      Result Value Range   LDL Particle Number 624  <1000 nmol/L   LDL (calc) 50  <100 mg/dL   HDL-C 36 (*) >=30 mg/dL   Triglycerides 82  <865 mg/dL   Cholesterol, Total 784  <200 mg/dL   HDL Particle Number 69.6 (*) >=30.5 umol/L   Large HDL-P 4.9  >=4.8  umol/L   Large VLDL-P 2.5  <=2.7 nmol/L   Small LDL Particle Number 443  <=527 nmol/L   LDL Size 20.0 (*) >20.5 nm   HDL Size 9.3  >=9.2 nm   VLDL Size 45.4  <=46.6 nm   LP-IR Score 44  <=45  PSA      Result Value Range   PSA 0.29  <=4.00 ng/mL    OBJECTIVE: APPEARANCE:  Patient in no acute distress.The patient appeared well nourished and normally developed. Acyanotic. Waist: VITAL SIGNS:BP 117/72  Pulse 71  Temp(Src) 97.1 F (36.2 C) (Oral)  Wt 205 lb 12.8 oz (93.35 kg)  BMI 29.53 kg/m2   SKIN: warm and  Dry without overt rashes, tattoos and scars  HEAD and Neck: without JVD, Head and scalp: normal Eyes:No scleral icterus. Fundi normal, eye movements normal. Ears: Auricle normal, canal normal, Tympanic membranes normal, insufflation normal. Nose: normal Throat: normal Neck & thyroid: normal  CHEST & LUNGS: Chest wall: normal Lungs: Clear  CVS: Reveals the PMI to be normally located. Regular rhythm, First and Second Heart sounds are normal,  absence of murmurs, rubs or gallops.  Peripheral vasculature: Radial pulses: normal Dorsal pedis pulses: normal Posterior pulses: normal  ABDOMEN:  Appearance: normal Benign, no organomegaly, no masses, no Abdominal Aortic enlargement. No Guarding , no rebound. No Bruits. Bowel sounds: normal  RECTAL: N/A GU: N/A  EXTREMETIES: nonedematous. Both Femoral and Pedal pulses are normal.  MUSCULOSKELETAL:  Spine: normal Joints: intact  NEUROLOGIC: oriented to time,place and person; nonfocal. Strength is normal Sensory is normal Reflexes are normal Cranial Nerves are normal.  ASSESSMENT: HTN (hypertension) - stable  HLD (hyperlipidemia)  CAD (coronary artery disease) - stable   PLAN:      Dr Woodroe Mode Recommendations  Diet and Exercise discussed with patient.  For nutrition information, I recommend books:  1).Eat to Live by Dr Monico Hoar. 2).Prevent and Reverse Heart Disease by Dr Suzzette Righter. 3) Dr Katherina Right Book:  Program to Reverse Diabetes  Exercise recommendations are:  If unable to walk, then the patient can exercise in a chair 3 times a day. By flapping arms like a bird gently and raising legs outwards to the front.  If ambulatory, the patient can go for walks for 30 minutes 3 times a week. Then increase the intensity and duration as tolerated.  Goal is to try to attain exercise frequency to 5 times a week.  If applicable: Best to perform resistance exercises (machines or weights) 2 days a week and cardio type exercises 3 days per week.  Medications Discontinued During This Encounter  Medication Reason  . chlorthalidone (HYGROTON) 25 MG tablet Discontinued by provider   Return in about 3 months (around 05/06/2013) for Recheck medical problems.  Autum Benfer P. Modesto Charon, M.D.

## 2013-04-02 ENCOUNTER — Ambulatory Visit: Payer: Medicare PPO | Admitting: Neurosurgery

## 2013-04-02 ENCOUNTER — Other Ambulatory Visit (INDEPENDENT_AMBULATORY_CARE_PROVIDER_SITE_OTHER): Payer: Medicare PPO | Admitting: *Deleted

## 2013-04-02 DIAGNOSIS — I6529 Occlusion and stenosis of unspecified carotid artery: Secondary | ICD-10-CM

## 2013-04-05 ENCOUNTER — Other Ambulatory Visit: Payer: Self-pay | Admitting: *Deleted

## 2013-04-06 ENCOUNTER — Encounter: Payer: Self-pay | Admitting: Vascular Surgery

## 2013-05-08 ENCOUNTER — Other Ambulatory Visit: Payer: Self-pay | Admitting: Family Medicine

## 2013-07-07 ENCOUNTER — Other Ambulatory Visit: Payer: Self-pay | Admitting: Family Medicine

## 2013-08-04 ENCOUNTER — Other Ambulatory Visit: Payer: Self-pay | Admitting: Family Medicine

## 2013-08-05 ENCOUNTER — Encounter: Payer: Self-pay | Admitting: Family Medicine

## 2013-08-05 ENCOUNTER — Ambulatory Visit (INDEPENDENT_AMBULATORY_CARE_PROVIDER_SITE_OTHER): Payer: Medicare HMO | Admitting: Family Medicine

## 2013-08-05 VITALS — BP 187/84 | HR 63 | Temp 97.7°F | Ht 70.0 in | Wt 204.8 lb

## 2013-08-05 DIAGNOSIS — G459 Transient cerebral ischemic attack, unspecified: Secondary | ICD-10-CM | POA: Insufficient documentation

## 2013-08-05 DIAGNOSIS — I779 Disorder of arteries and arterioles, unspecified: Secondary | ICD-10-CM

## 2013-08-05 DIAGNOSIS — I739 Peripheral vascular disease, unspecified: Secondary | ICD-10-CM | POA: Insufficient documentation

## 2013-08-05 DIAGNOSIS — E785 Hyperlipidemia, unspecified: Secondary | ICD-10-CM

## 2013-08-05 DIAGNOSIS — I2510 Atherosclerotic heart disease of native coronary artery without angina pectoris: Secondary | ICD-10-CM

## 2013-08-05 DIAGNOSIS — E559 Vitamin D deficiency, unspecified: Secondary | ICD-10-CM | POA: Insufficient documentation

## 2013-08-05 DIAGNOSIS — I10 Essential (primary) hypertension: Secondary | ICD-10-CM

## 2013-08-05 DIAGNOSIS — N402 Nodular prostate without lower urinary tract symptoms: Secondary | ICD-10-CM | POA: Insufficient documentation

## 2013-08-05 MED ORDER — CARVEDILOL 3.125 MG PO TABS: ORAL_TABLET | ORAL | Status: DC

## 2013-08-05 MED ORDER — ROSUVASTATIN CALCIUM 20 MG PO TABS: ORAL_TABLET | ORAL | Status: DC

## 2013-08-05 NOTE — Patient Instructions (Signed)
      Dr Hanadi Stanly's Recommendations  For nutrition information, I recommend books:  1).Eat to Live by Dr Joel Fuhrman. 2).Prevent and Reverse Heart Disease by Dr Caldwell Esselstyn. 3) Dr Neal Barnard's Book:  Program to Reverse Diabetes  Exercise recommendations are:  If unable to walk, then the patient can exercise in a chair 3 times a day. By flapping arms like a bird gently and raising legs outwards to the front.  If ambulatory, the patient can go for walks for 30 minutes 3 times a week. Then increase the intensity and duration as tolerated.  Goal is to try to attain exercise frequency to 5 times a week.  If applicable: Best to perform resistance exercises (machines or weights) 2 days a week and cardio type exercises 3 days per week.  

## 2013-08-05 NOTE — Progress Notes (Signed)
Patient ID: Hector Torres, male   DOB: 1941-02-25, 72 y.o.   MRN: [id number] SUBJECTIVE: CC: Chief Complaint  Patient presents with  . Medication Refill    labwork    HPI:  Patient is here for follow up of hyperlipidemia/TIA/CAD/HTN: denies Headache;denies Chest Pain;denies weakness;denies Shortness of Breath and orthopnea;denies Visual changes;denies palpitations;denies cough;denies pedal edema;denies symptoms of TIA or stroke;deniesClaudication symptoms. admits to Compliance with medications; denies Problems with medications.  Has been doing well.  Past Medical History  Diagnosis Date  . Carotid artery disease     Bilateral - Dr. Imogene Burn  . PVD (peripheral vascular disease)   . TIA (transient ischemic attack)   . Coronary atherosclerosis of native coronary artery     [facility]CA circumflex at [facility]  . Essential hypertension, benign     Possible white coat  . Mixed hyperlipidemia   . Prostate nodule   . Vitamin D deficiency    Past Surgical History  Procedure Laterality Date  . Eye surgery      Bilateral cataract  . Appendectomy  1947  . Tonsillectomy  1968  . Traumatic injury  1962    Lost tip of index, middle, and ring finger/ right hand   History   Social History  . Marital Status: Married    Spouse Name: N/A    Number of Children: N/A  . Years of Education: N/A   Occupational History  . Not on file.   Social History Main Topics  . Smoking status: Former Smoker -- 1.50 packs/day for 60 years    Types: Cigarettes    Quit date: 02/27/2011  . Smokeless tobacco: Never Used  . Alcohol Use: No  . Drug Use: No  . Sexual Activity: Not on file   Other Topics Concern  . Not on file   Social History Narrative  . No narrative on file   Family History  Problem Relation Age of Onset  . Cancer Mother     Gastric  . Cancer Father     prostate  . Other Brother     carotid artery stenosis  . Cancer Brother     Leukemia  . Cancer Brother     Colon CA and  prostate CA   Current Outpatient Prescriptions on File Prior to Visit  Medication Sig Dispense Refill  . acetaminophen (TYLENOL) 325 MG tablet Take 650 mg by mouth every 6 (six) hours as needed.        Marland Kitchen aspirin 81 MG tablet Take 81 mg by mouth daily.        . Cholecalciferol (VITAMIN D) 1000 UNITS capsule Take 5,000 Units by mouth 2 (two) times daily. Take 3 capsules daily      . Multiple Vitamin (MULTIVITAMIN PO) Take 1 tablet by mouth daily.        . Omega-3 Fatty Acids (FISH OIL) 1000 MG CAPS Take 1 capsule by mouth daily.        . tamsulosin (FLOMAX) 0.4 MG CAPS Take 0.4 mg by mouth 2 (two) times daily.      . vitamin E 400 UNIT capsule Take 400 Units by mouth daily.         No current facility-administered medications on file prior to visit.   No Known Allergies Immunization History  Administered Date(s) Administered  . Influenza Split 06/11/2013  . Pneumococcal Polysaccharide-23 06/27/2008  . Tdap 04/25/2006  . Zoster 08/10/2008   Prior to Admission medications   Medication Sig Start Date End Date Taking? Authorizing  Provider  acetaminophen (TYLENOL) 325 MG tablet Take 650 mg by mouth every 6 (six) hours as needed.      Historical Provider, MD  aspirin 81 MG tablet Take 81 mg by mouth daily.      Historical Provider, MD  carvedilol (COREG) 3.125 MG tablet TAKE 1 TABLET BY MOUTH TWICE A DAY 08/04/13   Ileana Ladd, MD  Cholecalciferol (VITAMIN D) 1000 UNITS capsule Take 5,000 Units by mouth 2 (two) times daily. Take 3 capsules daily    Historical Provider, MD  CRESTOR 20 MG tablet TAKE 1 TABLET BY MOUTH EVERY DAY 05/08/13   Ernestina Penna, MD  Multiple Vitamin (MULTIVITAMIN PO) Take 1 tablet by mouth daily.      Historical Provider, MD  naproxen sodium (ANAPROX) 220 MG tablet Take 220 mg by mouth. Take 2 tablets daily / as needed     Historical Provider, MD  Omega-3 Fatty Acids (FISH OIL) 1000 MG CAPS Take 1 capsule by mouth daily.      Historical Provider, MD   Sulfamethoxazole-Trimethoprim (SULFAMETHOXAZOLE-TMP DS PO) Take 1 tablet by mouth 2 (two) times daily.    Historical Provider, MD  tamsulosin (FLOMAX) 0.4 MG CAPS Take 0.4 mg by mouth 2 (two) times daily.    Historical Provider, MD  vitamin E 400 UNIT capsule Take 400 Units by mouth daily.      Historical Provider, MD     ROS: As above in the HPI. All other systems are stable or negative.  OBJECTIVE: APPEARANCE:  Patient in no acute distress.The patient appeared well nourished and normally developed. Acyanotic. Waist: VITAL SIGNS:BP 187/84  Pulse 63  Temp(Src) 97.7 F (36.5 C) (Oral)  Ht 5\' 10"  (1.778 m)  Wt 204 lb 12.8 oz (92.897 kg)  BMI 29.39 kg/m2  WM BP 140/80 recheck  SKIN: warm and  Dry without overt rashes, tattoos and scars  HEAD and Neck: without JVD, Head and scalp: normal Eyes:No scleral icterus. Fundi normal, eye movements normal. Ears: Auricle normal, canal normal, Tympanic membranes normal, insufflation normal. Nose: normal Throat: normal Neck & thyroid: normal  CHEST & LUNGS: Chest wall: normal Lungs: Clear  CVS: Reveals the PMI to be normally located. Regular rhythm, First and Second Heart sounds are normal,  absence of murmurs, rubs or gallops. Peripheral vasculature: Radial pulses: normal Dorsal pedis pulses: normal Posterior pulses: normal  ABDOMEN:  Appearance: mod central fat distribution Benign, no organomegaly, no masses, no Abdominal Aortic enlargement. No Guarding , no rebound. No Bruits. Bowel sounds: normal  RECTAL: N/A GU: N/A  EXTREMETIES: nonedematous.  MUSCULOSKELETAL:  Spine: normal Joints: intact  NEUROLOGIC: oriented to time,place and person; nonfocal. Strength is normal Sensory is normal Reflexes are normal Cranial Nerves are normal.  Results for orders placed in visit on 12/25/12  COMPLETE METABOLIC PANEL WITH GFR      Result Value Range   Sodium 139  135 - 145 mEq/L   Potassium 4.8  3.5 - 5.3 mEq/L    Chloride 105  96 - 112 mEq/L   CO2 30  19 - 32 mEq/L   Glucose, Bld 89  70 - 99 mg/dL   BUN 13  6 - 23 mg/dL   Creat 4.09  8.11 - 9.14 mg/dL   Total Bilirubin 0.5  0.3 - 1.2 mg/dL   Alkaline Phosphatase 57  39 - 117 U/L   AST 15  0 - 37 U/L   ALT 11  0 - 53 U/L   Total Protein 6.2  6.0 - 8.3 g/dL   Albumin 3.8  3.5 - 5.2 g/dL   Calcium 9.4  8.4 - 29.5 mg/dL   GFR, Est African American 79     GFR, Est Non African American 68    NMR LIPOPROFILE WITH LIPIDS      Result Value Range   LDL Particle Number 624  <1000 nmol/L   LDL (calc) 50  <100 mg/dL   HDL-C 36 (*) >=62 mg/dL   Triglycerides 82  <130 mg/dL   Cholesterol, Total 865  <200 mg/dL   HDL Particle Number 78.4 (*) >=30.5 umol/L   Large HDL-P 4.9  >=4.8 umol/L   Large VLDL-P 2.5  <=2.7 nmol/L   Small LDL Particle Number 443  <=527 nmol/L   LDL Size 20.0 (*) >20.5 nm   HDL Size 9.3  >=9.2 nm   VLDL Size 45.4  <=46.6 nm   LP-IR Score 44  <=45  PSA      Result Value Range   PSA 0.29  <=4.00 ng/mL     ASSESSMENT:  HTN (hypertension) - Plan: CMP14+EGFR, carvedilol (COREG) 3.125 MG tablet  HLD (hyperlipidemia) - Plan: CMP14+EGFR, Lipid panel, rosuvastatin (CRESTOR) 20 MG tablet  Carotid artery disease  CAD (coronary artery disease)  TIA (transient ischemic attack)  Prostate nodule  Vitamin D deficiency - Plan: Vit D  25 hydroxy (rtn osteoporosis monitoring)  PLAN:       Dr Woodroe Mode Recommendations  For nutrition information, I recommend books:  1).Eat to Live by Dr Monico Hoar. 2).Prevent and Reverse Heart Disease by Dr Suzzette Righter. 3) Dr Katherina Right Book:  Program to Reverse Diabetes  Exercise recommendations are:  If unable to walk, then the patient can exercise in a chair 3 times a day. By flapping arms like a bird gently and raising legs outwards to the front.  If ambulatory, the patient can go for walks for 30 minutes 3 times a week. Then increase the intensity and duration as  tolerated.  Goal is to try to attain exercise frequency to 5 times a week.  If applicable: Best to perform resistance exercises (machines or weights) 2 days a week and cardio type exercises 3 days per week.  Orders Placed This Encounter  Procedures  . CMP14+EGFR  . Lipid panel  . Vit D  25 hydroxy (rtn osteoporosis monitoring)   Meds ordered this encounter  Medications  . carvedilol (COREG) 3.125 MG tablet    Sig: TAKE 1 TABLET BY MOUTH TWICE A DAY    Dispense:  60 tablet    Refill:  11  . rosuvastatin (CRESTOR) 20 MG tablet    Sig: TAKE 1 TABLET BY MOUTH EVERY DAY    Dispense:  30 tablet    Refill:  11   Medications Discontinued During This Encounter  Medication Reason  . Sulfamethoxazole-Trimethoprim (SULFAMETHOXAZOLE-TMP DS PO) Completed Course  . naproxen sodium (ANAPROX) 220 MG tablet Completed Course  . carvedilol (COREG) 3.125 MG tablet Reorder  . CRESTOR 20 MG tablet Reorder   Return in about 3 months (around 11/03/2013), or return tomorrow for BP check, for Recheck medical problems.  Juli Odom P. Modesto Charon, M.D.

## 2013-08-06 ENCOUNTER — Ambulatory Visit (INDEPENDENT_AMBULATORY_CARE_PROVIDER_SITE_OTHER): Payer: Medicare HMO | Admitting: *Deleted

## 2013-08-06 DIAGNOSIS — I10 Essential (primary) hypertension: Secondary | ICD-10-CM

## 2013-08-06 LAB — LIPID PANEL
Chol/HDL Ratio: 3 ratio units (ref 0.0–5.0)
Cholesterol, Total: 121 mg/dL (ref 100–199)
HDL: 40 mg/dL (ref >39)
LDL Calculated: 61 mg/dL (ref 0–99)
Triglycerides: 101 mg/dL (ref 0–149)
VLDL Cholesterol Cal: 20 mg/dL (ref 5–40)

## 2013-08-06 LAB — CMP14+EGFR
ALT: 14 IU/L (ref 0–44)
AST: 16 IU/L (ref 0–40)
Albumin/Globulin Ratio: 2.2 (ref 1.1–2.5)
Albumin: 4.3 g/dL (ref 3.5–4.8)
Alkaline Phosphatase: 66 IU/L (ref 39–117)
BUN/Creatinine Ratio: 12 (ref 10–22)
BUN: 13 mg/dL (ref 8–27)
CO2: 29 mmol/L (ref 18–29)
Calcium: 9.2 mg/dL (ref 8.6–10.2)
Chloride: 102 mmol/L (ref 97–108)
Creatinine, Ser: 1.09 mg/dL (ref 0.76–1.27)
GFR calc Af Amer: 77 mL/min/{1.73_m2} (ref >59)
GFR calc non Af Amer: 67 mL/min/{1.73_m2} (ref >59)
Globulin, Total: 2 g/dL (ref 1.5–4.5)
Glucose: 83 mg/dL (ref 65–99)
Potassium: 4.4 mmol/L (ref 3.5–5.2)
Sodium: 145 mmol/L — ABNORMAL HIGH (ref 134–144)
Total Bilirubin: 0.4 mg/dL (ref 0.0–1.2)
Total Protein: 6.3 g/dL (ref 6.0–8.5)

## 2013-08-06 LAB — VITAMIN D 25 HYDROXY (VIT D DEFICIENCY, FRACTURES): Vit D, 25-Hydroxy: 50.9 ng/mL (ref 30.0–100.0)

## 2013-08-06 NOTE — Progress Notes (Signed)
Patient came in for a BP check per Hector Torres: BP: 132/83  P: 66  I let patient know that I would send this over to Dr. Modesto Charon to review.

## 2013-08-08 NOTE — Progress Notes (Signed)
BP satisfactory for now. No changes in follow up.

## 2013-10-07 ENCOUNTER — Encounter: Payer: Self-pay | Admitting: Vascular Surgery

## 2013-10-08 ENCOUNTER — Encounter: Payer: Self-pay | Admitting: Vascular Surgery

## 2013-10-08 ENCOUNTER — Ambulatory Visit (INDEPENDENT_AMBULATORY_CARE_PROVIDER_SITE_OTHER): Payer: Medicare PPO | Admitting: Vascular Surgery

## 2013-10-08 ENCOUNTER — Ambulatory Visit (HOSPITAL_COMMUNITY)
Admission: RE | Admit: 2013-10-08 | Discharge: 2013-10-08 | Disposition: A | Discharge status: Home / Self Care | Payer: Medicare PPO | Source: Ambulatory Visit | Attending: Vascular Surgery | Admitting: Vascular Surgery

## 2013-10-08 VITALS — BP 193/88 | HR 59 | Ht 70.0 in | Wt 211.0 lb

## 2013-10-08 DIAGNOSIS — I6529 Occlusion and stenosis of unspecified carotid artery: Secondary | ICD-10-CM

## 2013-10-08 DIAGNOSIS — I658 Occlusion and stenosis of other precerebral arteries: Secondary | ICD-10-CM | POA: Insufficient documentation

## 2013-10-08 NOTE — Progress Notes (Signed)
    Established Carotid Patient  History of Present Illness  Hector Torres is a 73 y.o. (1940/11/18) male who presents with chief complaint: routine follow-up.  Previous carotid studies demonstrated: RICA 40-98% stenosis, LICA 11-91% stenosis.  Patient has no recent history of TIA or stroke symptom.  The patient has never had amaurosis fugax or monocular blindness.  The patient has previously transient RUE weakness 4-5 years ago.  The patient has previously had transient expressive aphasia 4-5 years ago.  he patient's previous neurologic deficits have resolved.  The patient's PMH, PSH, SH, FamHx, Med, and Allergies are unchanged from 03/29/11.  On ROS today: no CVA or TIA sx, no dizziness, no motor weakness or aphasia  Physical Examination  Filed Vitals:   10/08/13 1109 10/08/13 1110  BP: 213/92 193/88  Pulse: 59   Height: 5\' 10"  (1.778 m)   Weight: 211 lb (95.709 kg)   SpO2: 97%    Body mass index is 30.28 kg/(m^2).  General: A&O x 3, WDWN  Eyes: PERRLA, EOMI  Neck: Supple,   Pulmonary: Sym exp, good air movt, CTAB, no rales, rhonchi, & wheezing  Cardiac: RRR, Nl S1, S2, no Murmurs, rubs or gallops  Vascular: Vessel Right Left  Radial Palpable Palpable  Brachial Palpable Palpable  Carotid Palpable, without bruit Palpable, without bruit  Aorta Not palpable N/A  Femoral Palpable Palpable  Popliteal Not palpable Not palpable  PT Not palpable Faintly Palpable  DP Faintly Palpable Faintly Palpable   Gastrointestinal: soft, NTND, -G/R, - HSM, - masses, - CVAT B  Musculoskeletal: M/S 5/5 throughout , distal amp of R fingers well healed, Extremities without ischemic changes   Neurologic: CN 2-12 intact , Pain and light touch intact in extremities , Motor exam as listed above  Non-Invasive Vascular Imaging  CAROTID DUPLEX (Date: 10/08/2013):   R ICA stenosis: 40-59%  R VA: patent and antegrade  L ICA stenosis: 60-79%  L VA: patent and antegrade (319/88  c/s)  Medical Decision Making  Hector Torres is a 73 y.o. male who presents with: asx B ICA stenosis <80%.   I think the 4-5 years of no sx puts him essentially in the asx category at this point.    There has been significant increase in velocity in L ICA.  It's not clear to be if this is due to just recent change in ultrasound equipment or his disease has truly progressed.  Regardless its below the 80% threshold (EDV > 125 c/s) so I don't think a 2nd study is immediately necessary.  Based on the patient's vascular studies and examination, I have offered the patient: q6 month B carotid duplex.  I discussed in depth with the patient the nature of atherosclerosis, and emphasized the importance of maximal medical management including strict control of blood pressure, blood glucose, and lipid levels, antiplatelet agents, obtaining regular exercise, and cessation of smoking.    The patient is aware that without maximal medical management the underlying atherosclerotic disease process will progress, limiting the benefit of any interventions. The patient is currently on a statin: Crestor. The patient is currently on an anti-platelet: ASA.  Thank you for allowing [place] to participate in this patient's care.  Adele Barthel, MD [facility] Office: [phone] Pager: [phone]  10/08/2013, 12:39 PM

## 2013-10-08 NOTE — Addendum Note (Signed)
Addended by: Dorthula Rue L on: 10/08/2013 04:51 PM   Modules accepted: Orders

## 2013-10-29 ENCOUNTER — Ambulatory Visit (INDEPENDENT_AMBULATORY_CARE_PROVIDER_SITE_OTHER): Payer: Medicare PPO | Admitting: Cardiology

## 2013-10-29 ENCOUNTER — Encounter: Payer: Self-pay | Admitting: *Deleted

## 2013-10-29 ENCOUNTER — Encounter: Payer: Self-pay | Admitting: Cardiology

## 2013-10-29 VITALS — BP 160/95 | HR 74 | Ht 71.0 in | Wt 210.0 lb

## 2013-10-29 DIAGNOSIS — I6529 Occlusion and stenosis of unspecified carotid artery: Secondary | ICD-10-CM

## 2013-10-29 DIAGNOSIS — I10 Essential (primary) hypertension: Secondary | ICD-10-CM

## 2013-10-29 DIAGNOSIS — I2510 Atherosclerotic heart disease of native coronary artery without angina pectoris: Secondary | ICD-10-CM

## 2013-10-29 DIAGNOSIS — E782 Mixed hyperlipidemia: Secondary | ICD-10-CM

## 2013-10-29 MED ORDER — NITROGLYCERIN 0.4 MG SL SUBL: 0.4000 mg | SUBLINGUAL_TABLET | SUBLINGUAL | Status: DC | PRN

## 2013-10-29 NOTE — Assessment & Plan Note (Signed)
He continues on statin therapy, recent LDL at goal.

## 2013-10-29 NOTE — Assessment & Plan Note (Signed)
Recent followup with Dr. Bridgett Larsson noted, progression in LICA stenosis now at 60-79% range.

## 2013-10-29 NOTE — Assessment & Plan Note (Addendum)
The patient reporting increasing angina symptoms. Last ischemic workup was in 2012. ECG is chronically abnormal as outlined above. Plan is to obtain a [employer] on medical therapy and decide whether we need to pursue evaluation for revascularization options, versus further optimizing medical therapy. Prescription for nitroglycerin given today.

## 2013-10-29 NOTE — Assessment & Plan Note (Signed)
We discussed his blood pressure today. He does have probable whitecoat hypertension, but I am concerned that his trend is also increasing at baseline. We discussed his diet, sodium intake, also activity. He previously did not tolerate lisinopril due to symptomatic hypotension. Depending on the results of the stress test, we can discuss different adjustments.

## 2013-10-29 NOTE — Progress Notes (Signed)
Clinical Summary  Hector Torres is a 73 y.o.male last seen in March 2014. He tells me that since that time he has been experiencing more regular exertional angina symptoms. Has not had any nitroglycerin to take. He is reporting symptoms at least once every one to 2 weeks. ECG today shows sinus rhythm with inferolateral ST-T wave abnormalities that are actually fairly stable compared to serial tracings.  Lab work from December 2014 showed BUN 13, creatinine 1.0, potassium 4.4, normal LFTs, cholesterol 121, triglycerides 101, HDL 40, LDL 61.  He continues on statin therapy.  Followup with Dr. Bridgett Larsson noted in February of this year with 40-59% RICA stenosis, and 82-99% LICA stenosis.  Echocardiogram in August 2012 demonstrated mild to moderate LVH with LVEF 37-16%, grade 1 diastolic dysfunction, inferolateral wall and apical hypokinesis, mildly thickened aortic leaflets without stenosis. Myoview study also done at that time demonstrated inferior wall defect suggesting either attenuation versus scar, no ischemia, medical therapy was recommended.   No Known Allergies  Current Outpatient Prescriptions  Medication Sig Dispense Refill  . acetaminophen (TYLENOL) 325 MG tablet Take 650 mg by mouth every 6 (six) hours as needed.        Marland Kitchen aspirin 325 MG EC tablet Take 325 mg by mouth daily.      . carvedilol (COREG) 3.125 MG tablet TAKE 1 TABLET BY MOUTH TWICE A DAY  60 tablet  11  . Cholecalciferol (VITAMIN D) 1000 UNITS capsule Take 5,000 Units by mouth 2 (two) times daily. Take 3 capsules daily      . Multiple Vitamin (MULTIVITAMIN PO) Take 1 tablet by mouth daily.        . rosuvastatin (CRESTOR) 20 MG tablet TAKE 1 TABLET BY MOUTH EVERY DAY  30 tablet  11  . tamsulosin (FLOMAX) 0.4 MG CAPS Take 0.4 mg by mouth 2 (two) times daily.      . vitamin E 400 UNIT capsule Take 400 Units by mouth daily.        . nitroGLYCERIN (NITROSTAT) 0.4 MG SL tablet Place 1 tablet (0.4 mg total) under the tongue every 5  (five) minutes as needed for chest pain.  25 tablet  3   No current facility-administered medications for this visit.    Past Medical History  Diagnosis Date  . Carotid artery disease     Bilateral - Dr. Bridgett Larsson  . PVD (peripheral vascular disease)   . TIA (transient ischemic attack)   . Coronary atherosclerosis of native coronary artery     [facility]flex [facility]  . Essential hypertension, benign     Possible white coat  . Mixed hyperlipidemia   . Prostate nodule   . Vitamin D deficiency   . Stroke     Social History Mr. Hector Torres reports that he quit smoking about 2 years ago. His smoking use included Cigarettes. He has a 90 pack-year smoking history. He has never used smokeless tobacco. Mr. Hector Torres reports that he does not drink alcohol.  Review of Systems No palpitations or syncope. Stable appetite. He has gained weight in the last year, trying to watch his diet more carefully. Otherwise negative.  Physical Examination Filed Vitals:   10/29/13 0816  BP: 160/95  Pulse: 74   Filed Weights   10/29/13 0816  Weight: 210 lb (95.255 kg)    Overweight male in no acute distress.  HEENT: Conjunctiva and lids are normal, oropharynx clear.  Neck: Supple, bilateral carotid bruits, no thyromegaly.  Lungs: Decreased breath sounds,  nonlabored.  Cardiac: Regular rate and rhythm with 2/6 systolic murmur at the base, also possible bruit in the subclavian distribution, high-pitched. No S3 gallop or rub.  Abdomen: Soft, nontender, bowel sounds present.  Skin: Warm and dry.  Musculoskeletal: No kyphosis.  Extremities: No pitting edema, distal pulses 1+ to 2+.  Neuropsychiatric: Alert and oriented x3, affect appropriate.   Problem List and Plan   Coronary atherosclerosis of native coronary artery The patient reporting increasing angina symptoms. Last ischemic workup was in 2012. ECG is chronically abnormal as outlined above. Plan is to obtain a [employer] on medical therapy  and decide whether we need to pursue evaluation for revascularization options, versus further optimizing medical therapy. Prescription for nitroglycerin given today.  Essential hypertension, benign We discussed his blood pressure today. He does have probable whitecoat hypertension, but I am concerned that his trend is also increasing at baseline. We discussed his diet, sodium intake, also activity. He previously did not tolerate lisinopril due to symptomatic hypotension. Depending on the results of the stress test, we can discuss different adjustments.  Mixed hyperlipidemia He continues on statin therapy, recent LDL at goal.  Occlusion and stenosis of carotid artery without mention of cerebral infarction Recent followup with Dr. Bridgett Larsson noted, progression in LICA stenosis now at 60-79% range.    Satira Sark, M.D., F.A.C.C.

## 2013-10-29 NOTE — Patient Instructions (Signed)
   Nitroglycerin as needed for severe chest pain only - sent to pharm Continue all other medications.   Your physician has requested that you have a lexiscan myoview. For further information please visit [email or web address]. Please follow instruction sheet, as given. Office will contact with results via phone or letter.    Your physician wants you to follow up in: 6 months.  You will receive a reminder letter in the mail one-two months in advance.  If you don't receive a letter, please call our office to schedule the follow up appointment

## 2013-11-04 ENCOUNTER — Encounter (HOSPITAL_COMMUNITY)
Admission: RE | Admit: 2013-11-04 | Discharge: 2013-11-04 | Disposition: A | Discharge status: Home / Self Care | Payer: Medicare PPO | Source: Ambulatory Visit | Attending: Cardiology | Admitting: Cardiology

## 2013-11-04 ENCOUNTER — Encounter (HOSPITAL_COMMUNITY): Payer: Self-pay

## 2013-11-04 DIAGNOSIS — I2510 Atherosclerotic heart disease of native coronary artery without angina pectoris: Secondary | ICD-10-CM

## 2013-11-04 DIAGNOSIS — I209 Angina pectoris, unspecified: Secondary | ICD-10-CM | POA: Insufficient documentation

## 2013-11-04 DIAGNOSIS — I10 Essential (primary) hypertension: Secondary | ICD-10-CM | POA: Insufficient documentation

## 2013-11-04 DIAGNOSIS — I6529 Occlusion and stenosis of unspecified carotid artery: Secondary | ICD-10-CM | POA: Insufficient documentation

## 2013-11-04 DIAGNOSIS — E785 Hyperlipidemia, unspecified: Secondary | ICD-10-CM | POA: Insufficient documentation

## 2013-11-04 DIAGNOSIS — I2589 Other forms of chronic ischemic heart disease: Secondary | ICD-10-CM | POA: Insufficient documentation

## 2013-11-04 DIAGNOSIS — I4949 Other premature depolarization: Secondary | ICD-10-CM | POA: Insufficient documentation

## 2013-11-04 MED ORDER — TECHNETIUM TC 99M SESTAMIBI GENERIC - CARDIOLITE
10.0000 | Freq: Once | INTRAVENOUS | Status: AC | PRN
  Administered 2013-11-04: 10 via INTRAVENOUS

## 2013-11-04 MED ORDER — REGADENOSON 0.4 MG/5ML IV SOLN
INTRAVENOUS | Status: AC
  Administered 2013-11-04: 0.4 mg via INTRAVENOUS
  Filled 2013-11-04: qty 5

## 2013-11-04 MED ORDER — SODIUM CHLORIDE 0.9 % IJ SOLN
INTRAMUSCULAR | Status: AC
  Administered 2013-11-04: 10 mL via INTRAVENOUS
  Filled 2013-11-04: qty 10

## 2013-11-04 MED ORDER — TECHNETIUM TC 99M SESTAMIBI - CARDIOLITE
30.0000 | Freq: Once | INTRAVENOUS | Status: AC | PRN
  Administered 2013-11-04: 11:00:00 30 via INTRAVENOUS

## 2013-11-04 NOTE — Progress Notes (Signed)
Stress Lab Nurses Notes - Jahni Nazar 11/04/2013 Reason for doing test: CAD and Chest Pain Type of test: Wille Glaser Nurse performing test: Gerrit Halls, RN Nuclear Medicine Tech: Melburn Hake Echo Tech: Not Applicable MD performing test: S. McDowell/ K.Purcell Nails NP Family MD: Dr. Jacelyn Grip Test explained and consent signed: yes IV started: 22g jelco, Saline lock flushed, No redness or edema and Saline lock started in radiology Symptoms: SOB & stomach discomfort Treatment/Intervention: None Reason test stopped: protocol completed After recovery IV was: Discontinued via X-ray tech and No redness or edema Patient to return to [facility]. Med at : 11:45 Patient discharged: Home Patient's Condition upon discharge was: stable Comments: During test BP 108/72 & HR 80.  Recovery BP 162/85 & HR 73.  Symptoms resolved in recovery. Geanie Cooley T

## 2013-11-05 ENCOUNTER — Encounter: Payer: Self-pay | Admitting: Family Medicine

## 2013-11-05 ENCOUNTER — Ambulatory Visit (INDEPENDENT_AMBULATORY_CARE_PROVIDER_SITE_OTHER): Payer: Medicare HMO | Admitting: Family Medicine

## 2013-11-05 VITALS — BP 129/77 | HR 90 | Temp 97.8°F | Ht 71.0 in | Wt 209.8 lb

## 2013-11-05 DIAGNOSIS — I739 Peripheral vascular disease, unspecified: Secondary | ICD-10-CM

## 2013-11-05 DIAGNOSIS — E559 Vitamin D deficiency, unspecified: Secondary | ICD-10-CM

## 2013-11-05 DIAGNOSIS — I779 Disorder of arteries and arterioles, unspecified: Secondary | ICD-10-CM

## 2013-11-05 DIAGNOSIS — N402 Nodular prostate without lower urinary tract symptoms: Secondary | ICD-10-CM

## 2013-11-05 DIAGNOSIS — I10 Essential (primary) hypertension: Secondary | ICD-10-CM

## 2013-11-05 DIAGNOSIS — I6529 Occlusion and stenosis of unspecified carotid artery: Secondary | ICD-10-CM

## 2013-11-05 DIAGNOSIS — E785 Hyperlipidemia, unspecified: Secondary | ICD-10-CM

## 2013-11-05 DIAGNOSIS — N400 Enlarged prostate without lower urinary tract symptoms: Secondary | ICD-10-CM

## 2013-11-05 DIAGNOSIS — I2510 Atherosclerotic heart disease of native coronary artery without angina pectoris: Secondary | ICD-10-CM

## 2013-11-05 DIAGNOSIS — G459 Transient cerebral ischemic attack, unspecified: Secondary | ICD-10-CM

## 2013-11-05 MED ORDER — TAMSULOSIN HCL 0.4 MG PO CAPS: 0.4000 mg | ORAL_CAPSULE | Freq: Two times a day (BID) | ORAL | Status: DC

## 2013-11-05 NOTE — Patient Instructions (Addendum)
      Dr Paula Libra Recommendations  For nutrition information, I recommend books:  1).Eat to Live by Dr Excell Seltzer. 2).Prevent and Reverse Heart Disease by Dr Karl Luke. 3) Dr Janene Harvey Book:  Program to Reverse Diabetes  Exercise recommendations are:  If unable to walk, then the patient can exercise in a chair 3 times a day. By flapping arms like a bird gently and raising legs outwards to the front.  If ambulatory, the patient can go for walks for 30 minutes 3 times a week. Then increase the intensity and duration as tolerated.  Goal is to try to attain exercise frequency to 5 times a week.  If applicable: Best to perform resistance exercises (machines or weights) 2 days a week and cardio type exercises 3 days per week.   See your Urologist for  Further refills.

## 2013-11-05 NOTE — Progress Notes (Signed)
Patient ID: Hector Torres, male   DOB: [birth year], 73 y.o.   MRN: [id number] SUBJECTIVE: CC: Chief Complaint  Patient presents with  . Follow-up    3 MONTH FOLLOW UP CHRONIC PROBLEMS    HPI:  Patient is here for follow up of hyperlipidemia/HTN/Vit D Def/Cotid artery disease: denies Headache;denies Chest Pain;denies weakness;denies Shortness of Breath and orthopnea;denies Visual changes;denies palpitations;denies cough;denies pedal edema;denies symptoms of TIA or stroke;deniesClaudication symptoms. admits to Compliance with medications; denies Problems with medications.  Breakfast:cereal, 1/2 cup oartmeal, 1/2 banana Snack: pineapple or some fruit Lunch: none normally Supper: piece chicken breast/fish, sweet potato , corn, green beans. Tries to stick to 1500 calories  Not much exercise. Due to winter.  Past Medical History  Diagnosis Date  . Carotid artery disease     Bilateral - Dr. Bridgett Larsson  . PVD (peripheral vascular disease)   . TIA (transient ischemic attack)   . Coronary atherosclerosis of native coronary artery     [facility]CA circumflex at [facility]  . Essential hypertension, benign     Possible white coat  . Mixed hyperlipidemia   . Prostate nodule   . Vitamin D deficiency   . Stroke    Past Surgical History  Procedure Laterality Date  . Eye surgery      Bilateral cataract  . Appendectomy  1947  . Tonsillectomy  1968  . Traumatic injury  1962    Lost tip of index, middle, and ring finger/ right hand   History   Social History  . Marital Status: Married    Spouse Name: N/A    Number of Children: N/A  . Years of Education: N/A   Occupational History  . Not on file.   Social History Main Topics  . Smoking status: Former Smoker -- 1.50 packs/day for 60 years    Types: Cigarettes    Quit date: 02/27/2011  . Smokeless tobacco: Never Used  . Alcohol Use: No  . Drug Use: No  . Sexual Activity: Not on file   Other Topics Concern  . Not on file   Social History  Narrative  . No narrative on file   Family History  Problem Relation Age of Onset  . Cancer Mother     Gastric  . Varicose Veins Mother   . Cancer Father     prostate  . Other Brother     carotid artery stenosis  . Diabetes Brother   . Cancer Brother     Leukemia  . Hyperlipidemia Brother   . Cancer Brother     Colon CA and prostate CA  . Hyperlipidemia Brother   . Hypertension Brother   . Peripheral vascular disease Brother   . Hypertension Sister   . Diabetes Daughter   . Diabetes Son    Current Outpatient Prescriptions on File Prior to Visit  Medication Sig Dispense Refill  . acetaminophen (TYLENOL) 325 MG tablet Take 650 mg by mouth every 6 (six) hours as needed.        Marland Kitchen aspirin 325 MG EC tablet Take 325 mg by mouth daily.      . carvedilol (COREG) 3.125 MG tablet TAKE 1 TABLET BY MOUTH TWICE A DAY  60 tablet  11  . Cholecalciferol (VITAMIN D) 1000 UNITS capsule Take 5,000 Units by mouth 2 (two) times daily. Take 3 capsules daily      . Multiple Vitamin (MULTIVITAMIN PO) Take 1 tablet by mouth daily.        . nitroGLYCERIN (NITROSTAT) 0.4  MG SL tablet Place 1 tablet (0.4 mg total) under the tongue every 5 (five) minutes as needed for chest pain.  25 tablet  3  . rosuvastatin (CRESTOR) 20 MG tablet TAKE 1 TABLET BY MOUTH EVERY DAY  30 tablet  11  . vitamin E 400 UNIT capsule Take 400 Units by mouth daily.         No current facility-administered medications on file prior to visit.   No Known Allergies Immunization History  Administered Date(s) Administered  . Influenza Split 06/11/2013  . Pneumococcal Polysaccharide-23 06/27/2008  . Tdap 04/25/2006  . Zoster 08/10/2008   Prior to Admission medications   Medication Sig Start Date End Date Taking? Authorizing Provider  acetaminophen (TYLENOL) 325 MG tablet Take 650 mg by mouth every 6 (six) hours as needed.     Yes Historical Provider, MD  aspirin 325 MG EC tablet Take 325 mg by mouth daily.   Yes Historical  Provider, MD  carvedilol (COREG) 3.125 MG tablet TAKE 1 TABLET BY MOUTH TWICE A DAY 08/05/13  Yes Vernie Shanks, MD  Cholecalciferol (VITAMIN D) 1000 UNITS capsule Take 5,000 Units by mouth 2 (two) times daily. Take 3 capsules daily   Yes Historical Provider, MD  Multiple Vitamin (MULTIVITAMIN PO) Take 1 tablet by mouth daily.     Yes Historical Provider, MD  nitroGLYCERIN (NITROSTAT) 0.4 MG SL tablet Place 1 tablet (0.4 mg total) under the tongue every 5 (five) minutes as needed for chest pain. 10/29/13  Yes Satira Sark, MD  rosuvastatin (CRESTOR) 20 MG tablet TAKE 1 TABLET BY MOUTH EVERY DAY 08/05/13  Yes Vernie Shanks, MD  tamsulosin (FLOMAX) 0.4 MG CAPS Take 0.4 mg by mouth 2 (two) times daily.   Yes Historical Provider, MD  vitamin E 400 UNIT capsule Take 400 Units by mouth daily.     Yes Historical Provider, MD     ROS: As above in the HPI. All other systems are stable or negative.  OBJECTIVE: APPEARANCE:  Patient in no acute distress.The patient appeared well nourished and normally developed. Acyanotic. Waist: VITAL SIGNS:BP 129/77  Pulse 90  Temp(Src) 97.8 F (36.6 C) (Oral)  Ht _0  (1.803 m)  Wt 209 lb 12.8 oz (95.165 kg)  BMI 29.27 kg/m2 WM overweight  SKIN: warm and  Dry without overt rashes, tattoos and scars  HEAD and Neck: without JVD, Head and scalp: normal Eyes:No scleral icterus. Fundi normal, eye movements normal. Ears: Auricle normal, canal normal, Tympanic membranes normal, insufflation normal. Nose: normal Throat: normal Neck & thyroid: normal  CHEST & LUNGS: Chest wall: normal Lungs: Clear  CVS: Reveals the PMI to be normally located. Regular rhythm, First and Second Heart sounds are normal,  absence of murmurs, rubs or gallops. Peripheral vasculature: Radial pulses: normal Dorsal pedis pulses: normal Posterior pulses: normal  ABDOMEN:  Appearance: normal Benign, no organomegaly, no masses, no Abdominal Aortic enlargement. No  Guarding , no rebound. No Bruits. Bowel sounds: normal  RECTAL: N/A GU: N/A  EXTREMETIES: nonedematous.  MUSCULOSKELETAL:  Spine: normal Joints: intact  NEUROLOGIC: oriented to time,place and person; nonfocal. Strength is normal Sensory is normal Reflexes are normal Cranial Nerves are normal.  Results for orders placed in visit on 08/05/13  CMP14+EGFR      Result Value Ref Range   Glucose 83  65 - 99 mg/dL   BUN 13  8 - 27 mg/dL   Creatinine, Ser 1.09  0.76 - 1.27 mg/dL   GFR calc non  Af Amer 67  >59 mL/min/1.73   GFR calc Af Amer 77  >59 mL/min/1.73   BUN/Creatinine Ratio 12  10 - 22   Sodium 145 (*) 134 - 144 mmol/L   Potassium 4.4  3.5 - 5.2 mmol/L   Chloride 102  97 - 108 mmol/L   CO2 29  18 - 29 mmol/L   Calcium 9.2  8.6 - 10.2 mg/dL   Total Protein 6.3  6.0 - 8.5 g/dL   Albumin 4.3  3.5 - 4.8 g/dL   Globulin, Total 2.0  1.5 - 4.5 g/dL   Albumin/Globulin Ratio 2.2  1.1 - 2.5   Total Bilirubin 0.4  0.0 - 1.2 mg/dL   Alkaline Phosphatase 66  39 - 117 IU/L   AST 16  0 - 40 IU/L   ALT 14  0 - 44 IU/L  LIPID PANEL      Result Value Ref Range   Cholesterol, Total 121  100 - 199 mg/dL   Triglycerides 101  0 - 149 mg/dL   HDL 40  >39 mg/dL   VLDL Cholesterol Cal 20  5 - 40 mg/dL   LDL Calculated 61  0 - 99 mg/dL   Chol/HDL Ratio 3.0  0.0 - 5.0 ratio units  VITAMIN D 25 HYDROXY      Result Value Ref Range   Vit D, 25-Hydroxy 50.9  30.0 - 100.0 ng/mL    ASSESSMENT: TIA (transient ischemic attack) - Plan: Lipid panel  HTN (hypertension) - Plan: CMP14+EGFR  HLD (hyperlipidemia) - Plan: CMP14+EGFR, Lipid panel  Carotid artery disease  CAD (coronary artery disease)  Prostate nodule  Vitamin D deficiency - Plan: Vit D  25 hydroxy (rtn osteoporosis monitoring)  Occlusion and stenosis of carotid artery without mention of cerebral infarction  Coronary atherosclerosis of native coronary artery  BPH (benign prostatic hyperplasia) - Plan: tamsulosin (FLOMAX)  0.4 MG CAPS capsule, CMP14+EGFR He needed a refill on his tamsulosin. His urologist fills it and increased it to twice a day. He will make an appointment to see his urologist soon.   PLAN:        Dr Paula Libra Recommendations  For nutrition information, I recommend books:  1).Eat to Live by Dr Excell Seltzer. 2).Prevent and Reverse Heart Disease by Dr Karl Luke. 3) Dr Janene Harvey Book:  Program to Reverse Diabetes  Exercise recommendations are:  If unable to walk, then the patient can exercise in a chair 3 times a day. By flapping arms like a bird gently and raising legs outwards to the front.  If ambulatory, the patient can go for walks for 30 minutes 3 times a week. Then increase the intensity and duration as tolerated.  Goal is to try to attain exercise frequency to 5 times a week.  If applicable: Best to perform resistance exercises (machines or weights) 2 days a week and cardio type exercises 3 days per week.  Orders Placed This Encounter  Procedures  . CMP14+EGFR  . Lipid panel  . Vit D  25 hydroxy (rtn osteoporosis monitoring)   Meds ordered this encounter  Medications  . tamsulosin (FLOMAX) 0.4 MG CAPS capsule    Sig: Take 1 capsule (0.4 mg total) by mouth 2 (two) times daily.    Dispense:  60 capsule    Refill:  0   Medications Discontinued During This Encounter  Medication Reason  . tamsulosin (FLOMAX) 0.4 MG CAPS Reorder   Return in about 3 months (around 02/05/2014) for Recheck medical problems.  Tatanisha Cuthbert P. Jacelyn Grip, M.D.

## 2013-11-06 LAB — LIPID PANEL
Chol/HDL Ratio: 2.8 ratio units (ref 0.0–5.0)
Cholesterol, Total: 105 mg/dL (ref 100–199)
HDL: 37 mg/dL — ABNORMAL LOW (ref >39)
LDL Calculated: 52 mg/dL (ref 0–99)
Triglycerides: 78 mg/dL (ref 0–149)
VLDL Cholesterol Cal: 16 mg/dL (ref 5–40)

## 2013-11-06 LAB — CMP14+EGFR
ALT: 13 IU/L (ref 0–44)
AST: 17 IU/L (ref 0–40)
Albumin/Globulin Ratio: 2.1 (ref 1.1–2.5)
Albumin: 4 g/dL (ref 3.5–4.8)
Alkaline Phosphatase: 57 IU/L (ref 39–117)
BUN/Creatinine Ratio: 16 (ref 10–22)
BUN: 18 mg/dL (ref 8–27)
CO2: 24 mmol/L (ref 18–29)
Calcium: 8.9 mg/dL (ref 8.6–10.2)
Chloride: 105 mmol/L (ref 97–108)
Creatinine, Ser: 1.1 mg/dL (ref 0.76–1.27)
GFR calc Af Amer: 77 mL/min/{1.73_m2} (ref >59)
GFR calc non Af Amer: 66 mL/min/{1.73_m2} (ref >59)
Globulin, Total: 1.9 g/dL (ref 1.5–4.5)
Glucose: 84 mg/dL (ref 65–99)
Potassium: 4.1 mmol/L (ref 3.5–5.2)
Sodium: 144 mmol/L (ref 134–144)
Total Bilirubin: 0.4 mg/dL (ref 0.0–1.2)
Total Protein: 5.9 g/dL — ABNORMAL LOW (ref 6.0–8.5)

## 2013-11-06 LAB — VITAMIN D 25 HYDROXY (VIT D DEFICIENCY, FRACTURES): Vit D, 25-Hydroxy: 55 ng/mL (ref 30.0–100.0)

## 2013-11-08 ENCOUNTER — Telehealth: Payer: Self-pay | Admitting: Cardiology

## 2013-11-08 MED ORDER — LOSARTAN POTASSIUM 25 MG PO TABS: 25.0000 mg | ORAL_TABLET | Freq: Every day | ORAL | Status: DC

## 2013-11-08 NOTE — Telephone Encounter (Signed)
Had message on My Chart from Dr. Domenic Polite that he was to start taking Cozaar 25mg  daily.  Was this called to [facility] in [place]?   Please let the patient know.

## 2013-11-08 NOTE — Telephone Encounter (Signed)
Notes Recorded by Satira Sark, MD on 11/04/2013 at 2:34 PM Reviewed. Study is consistent with CAD, area of mild ischemia noted with LVEF 51%, overall a low risk study. He probably has had some progression in known CAD since remote PTCA of circumflex 1995. We can try to further optimize medications first and see how he does. Let's start Cozaar 25 mg daily. Continue other medications. Keep an eye on blood pressure. We gave him NTG at the recent visit. Would followup in three months, sooner if angina worsens.  Patient informed.

## 2014-02-03 ENCOUNTER — Encounter: Payer: Self-pay | Admitting: Cardiology

## 2014-02-03 NOTE — Progress Notes (Signed)
Clinical Summary Hector Torres is a 73 y.o.male last seen in March of this year. He was referred for followup cardiac testing at that time.  Lexiscan Cardiolite from March of this year showed no diagnostic ST segment abnormalities with rare PVCs. Mild anteroseptal ischemia noted at the mid to apical level with some component of scar, LVEF 51%. Medical therapy was recommended initially. Cozaar was added for better blood pressure control.  Lab work from March showed BUN 18, creatinine 1.1, potassium 4.1.  Followup with Dr. Bridgett Larsson noted in February of this year with 40-59% RICA stenosis, and 99-83% LICA stenosis.  He presents today telling me that he has had no further angina symptoms. He has been playing golf and walking. At this point he is most comfortable with observation on medical therapy, although would certainly consider a heart catheterization if his symptoms progress.   No Known Allergies  Current Outpatient Prescriptions  Medication Sig Dispense Refill  . acetaminophen (TYLENOL) 325 MG tablet Take 650 mg by mouth every 6 (six) hours as needed.        Marland Kitchen aspirin 325 MG EC tablet Take 325 mg by mouth daily.      . carvedilol (COREG) 3.125 MG tablet TAKE 1 TABLET BY MOUTH TWICE A DAY  60 tablet  11  . Cholecalciferol (VITAMIN D) 1000 UNITS capsule Take 5,000 Units by mouth 2 (two) times daily. Take 3 capsules daily      . finasteride (PROSCAR) 5 MG tablet Take 1 tablet by mouth daily.      Marland Kitchen losartan (COZAAR) 25 MG tablet Take 1 tablet (25 mg total) by mouth daily.  90 tablet  3  . Multiple Vitamin (MULTIVITAMIN PO) Take 1 tablet by mouth daily.        . nitroGLYCERIN (NITROSTAT) 0.4 MG SL tablet Place 1 tablet (0.4 mg total) under the tongue every 5 (five) minutes as needed for chest pain.  25 tablet  3  . rosuvastatin (CRESTOR) 20 MG tablet TAKE 1 TABLET BY MOUTH EVERY DAY  30 tablet  11  . tamsulosin (FLOMAX) 0.4 MG CAPS capsule Take 0.4 mg by mouth daily after supper.      . vitamin  E 400 UNIT capsule Take 400 Units by mouth daily.         No current facility-administered medications for this visit.    Past Medical History  Diagnosis Date  . Carotid artery disease     Bilateral - Dr. Bridgett Larsson  . PVD (peripheral vascular disease)   . TIA (transient ischemic attack)   . Coronary atherosclerosis of native coronary artery     PTCA circumflex at [facility]  . Essential hypertension, benign     Possible white coat  . Mixed hyperlipidemia   . Prostate nodule   . Vitamin D deficiency   . Stroke     Past Surgical History  Procedure Laterality Date  . Eye surgery      Bilateral cataract  . Appendectomy  1947  . Tonsillectomy  1968  . Traumatic injury  1962    Lost tip of index, middle, and ring finger/ right hand    Family History  Problem Relation Age of Onset  . Gastric cancer Mother   . Varicose Veins Mother   . Prostate cancer Father   . Other Brother     carotid artery stenosis  . Diabetes Brother   . Leukemia Brother   . Hyperlipidemia Brother   . Colon cancer Brother   .  Hyperlipidemia Brother   . Hypertension Brother   . Peripheral vascular disease Brother   . Hypertension Sister   . Diabetes Daughter   . Diabetes Son   . Prostate cancer Brother     Social History [name] reports that he quit smoking about 2 years ago. His smoking use included Cigarettes. He has a 90 pack-year smoking history. He has never used smokeless tobacco. [name] reports that he does not drink alcohol.  Review of Systems No palpitations or syncope. Other systems reviewed and negative.  Physical Examination Filed Vitals:   02/04/14 1049  BP: 180/89  Pulse: 65   Filed Weights   02/04/14 1049  Weight: 212 lb (96.163 kg)    Overweight male in no acute distress.  HEENT: Conjunctiva and lids are normal, oropharynx clear.  Neck: Supple, bilateral carotid bruits, no thyromegaly.  Lungs: Decreased breath sounds, nonlabored.  Cardiac: Regular rate and rhythm  with 2/6 systolic murmur at the base, also possible bruit in the subclavian distribution, high-pitched. No S3 gallop or rub. Skin: Warm and dry.  Extremities: No pitting edema, distal pulses 1+ to 2+.    Problem List and Plan   Coronary atherosclerosis of native coronary artery CAD, likely progressive over the years following angioplasty to the circumflex at [facility] back in 1995. Recent Cardiolite indicates mild anteroseptal ischemia at the mid to apical level, LVEF 51%. He has good symptom control on medical therapy and will continue observation. If symptoms progress however, cardiac catheterization can be discussed.  HTN (hypertension) Continue current regimen, can further uptitrate Cozaar if needed for blood pressure control. Keep followup with Dr. Jacelyn Grip.  HLD (hyperlipidemia) Continues on statin therapy.    Satira Sark, M.D., F.A.C.C.

## 2014-02-04 ENCOUNTER — Ambulatory Visit (INDEPENDENT_AMBULATORY_CARE_PROVIDER_SITE_OTHER): Payer: Medicare PPO | Admitting: Cardiology

## 2014-02-04 ENCOUNTER — Encounter: Payer: Self-pay | Admitting: Cardiology

## 2014-02-04 VITALS — BP 180/89 | HR 65 | Ht 71.0 in | Wt 212.0 lb

## 2014-02-04 DIAGNOSIS — I25119 Atherosclerotic heart disease of native coronary artery with unspecified angina pectoris: Secondary | ICD-10-CM

## 2014-02-04 DIAGNOSIS — I10 Essential (primary) hypertension: Secondary | ICD-10-CM

## 2014-02-04 DIAGNOSIS — I209 Angina pectoris, unspecified: Secondary | ICD-10-CM

## 2014-02-04 DIAGNOSIS — I2510 Atherosclerotic heart disease of native coronary artery without angina pectoris: Secondary | ICD-10-CM

## 2014-02-04 DIAGNOSIS — E785 Hyperlipidemia, unspecified: Secondary | ICD-10-CM

## 2014-02-04 NOTE — Assessment & Plan Note (Signed)
CAD, likely progressive over the years following angioplasty to the circumflex at [facility] back in 1995. Recent Cardiolite indicates mild anteroseptal ischemia at the mid to apical level, LVEF 51%. He has good symptom control on medical therapy and will continue observation. If symptoms progress however, cardiac catheterization can be discussed.

## 2014-02-04 NOTE — Assessment & Plan Note (Signed)
Continues on statin therapy. 

## 2014-02-04 NOTE — Assessment & Plan Note (Signed)
Continue current regimen, can further uptitrate Cozaar if needed for blood pressure control. Keep followup with Dr. Jacelyn Grip.

## 2014-02-04 NOTE — Patient Instructions (Signed)
Continue all current medications. Your physician wants you to follow up in: 6 months.  You will receive a reminder letter in the mail one-two months in advance.  If you don't receive a letter, please call our office to schedule the follow up appointment   

## 2014-04-07 ENCOUNTER — Encounter: Payer: Self-pay | Admitting: Vascular Surgery

## 2014-04-08 ENCOUNTER — Encounter: Payer: Self-pay | Admitting: Vascular Surgery

## 2014-04-08 ENCOUNTER — Ambulatory Visit (HOSPITAL_COMMUNITY)
Admission: RE | Admit: 2014-04-08 | Discharge: 2014-04-08 | Disposition: A | Discharge status: Home / Self Care | Payer: Medicare PPO | Source: Ambulatory Visit | Attending: Vascular Surgery | Admitting: Vascular Surgery

## 2014-04-08 ENCOUNTER — Ambulatory Visit (INDEPENDENT_AMBULATORY_CARE_PROVIDER_SITE_OTHER): Payer: Medicare PPO | Admitting: Vascular Surgery

## 2014-04-08 VITALS — BP 161/70 | HR 94 | Resp 18 | Ht 71.0 in | Wt 216.0 lb

## 2014-04-08 DIAGNOSIS — I6529 Occlusion and stenosis of unspecified carotid artery: Secondary | ICD-10-CM | POA: Diagnosis not present

## 2014-04-08 NOTE — Addendum Note (Signed)
Addended by: Mena Goes on: 04/08/2014 05:25 PM   Modules accepted: Orders

## 2014-04-08 NOTE — Progress Notes (Signed)
    Established Carotid Patient  History of Present Illness  Hector Torres is a 73 y.o. (1941/06/24) male who presents with chief complaint: routine maintainence.  Previous carotid studies demonstrated: RICA 75-17% stenosis, LICA 00-17% stenosis.  Patient has distant history of TIA symptom: 4-5 years ago notable for L hand weakness.  The patient has never had amaurosis fugax or monocular blindness. The patient has had transient right upper extremity plegia. The patient has had transient expressive aphasia.  The patient's previous neurologic deficits have resolved. This patient has also had prior studies concerning for L subclavian artery steal.  The patient denies any left arm complaints.  He has never had claudication equivalent sx.  The patient's PMH, PSH, SH, FamHx, Med, and Allergies are unchanged from 10/08/13.  On ROS today: no stroke or TIA, no left arm pain or weakness  Physical Examination  Filed Vitals:   04/08/14 1141 04/08/14 1142  BP: 150/76 161/70  Pulse: 95 94  Resp: 18   Height: 5\' 11"  (1.803 m)   Weight: 216 lb (97.977 kg)    Body mass index is 30.14 kg/(m^2).  General: A&O x 3, WD, Obese,   Eyes: PERRLA, EOMI  Neck: Supple, no nuchal rigidity, no palpable LAD  Pulmonary: Sym exp, good air movt, CTAB, no rales, rhonchi, & wheezing  Cardiac: RRR, Nl S1, S2, no Murmurs, rubs or gallops  Vascular: Vessel Right Left  Radial Palpable Palpable  Brachial Palpable Palpable  Carotid Palpable, without bruit Palpable, without bruit  Aorta Not palpable N/A  Femoral Palpable Palpable  Popliteal Not palpable Not palpable  PT Palpable Palpable  DP Palpable Palpable   Gastrointestinal: soft, NTND, -G/R, - HSM, - masses, - CVAT B  Musculoskeletal: M/S 5/5 throughout , Extremities without ischemic changes   Neurologic: CN 2-12 intact , Pain and light touch intact in extremities , Motor exam as listed above  Non-Invasive Vascular Imaging  CAROTID DUPLEX (Date:  04/08/2014):   R ICA stenosis: 40-59%  R VA: patent and antegrade  L ICA stenosis: 60-79%  L VA: patent and antegrade  Partial left subclavian steal  Medical Decision Making  [name] is a 73 y.o. male who presents with: essentially asx BICA stenosis <80%.   Based on the patient's vascular studies and examination, I have offered the patient: q6 month B carotid duplex.  I discussed in depth with the patient the nature of atherosclerosis, and emphasized the importance of maximal medical management including strict control of blood pressure, blood glucose, and lipid levels, antiplatelet agents, obtaining regular exercise, and cessation of smoking.    The patient is aware that without maximal medical management the underlying atherosclerotic disease process will progress, limiting the benefit of any interventions. The patient is currently on a statin: Crestor.   The patient is currently on an anti-platelet: ASA.  Thank you for allowing [place] to participate in this patient's care.  Adele Barthel, MD [facility] [place] Office: [phone] Pager: [phone]  04/08/2014, 1:19 PM

## 2014-07-21 ENCOUNTER — Encounter: Payer: Self-pay | Admitting: Family Medicine

## 2014-07-21 ENCOUNTER — Ambulatory Visit (INDEPENDENT_AMBULATORY_CARE_PROVIDER_SITE_OTHER): Payer: Medicare HMO | Admitting: Family Medicine

## 2014-07-21 DIAGNOSIS — E785 Hyperlipidemia, unspecified: Secondary | ICD-10-CM

## 2014-07-21 DIAGNOSIS — N400 Enlarged prostate without lower urinary tract symptoms: Secondary | ICD-10-CM

## 2014-07-21 DIAGNOSIS — I10 Essential (primary) hypertension: Secondary | ICD-10-CM

## 2014-07-21 MED ORDER — HYDROCHLOROTHIAZIDE 25 MG PO TABS: 25.0000 mg | ORAL_TABLET | Freq: Every day | ORAL | Status: DC

## 2014-07-21 NOTE — Progress Notes (Signed)
   Subjective:    Patient ID: Hector Torres, male    DOB: 1940/11/13, 73 y.o.   MRN: [id number]  HPI 73 year old gentleman here to follow-up hyperlipidemia, coronary artery disease, hypertension, he has some concerns about recent weight gain and bloating and has tried to go on a low carbohydrate diet but has not seen any reduction in weight doing this. He also mentions some chest pain that is relieved by Alka-Seltzer and rest. He mentioned this to his cardiologist who did not seem to be concerned but he does have a history of angioplasty in the past. He is concerned that finasteride may be causing some of his weight gain and other symptoms since he read online that it might have the side effect.    Review of Systems  Constitutional: Positive for unexpected weight change.  Gastrointestinal: Positive for abdominal pain and abdominal distention.  Musculoskeletal: Positive for arthralgias.       Objective:   Physical Exam  Constitutional: He is oriented to person, place, and time. He appears well-developed and well-nourished.  HENT:  Head: Normocephalic.  Right Ear: External ear normal.  Left Ear: External ear normal.  Nose: Nose normal.  Mouth/Throat: Oropharynx is clear and moist.  Eyes: Conjunctivae and EOM are normal. Pupils are equal, round, and reactive to light.  Neck: Normal range of motion. Neck supple.  Cardiovascular: Normal rate, regular rhythm, normal heart sounds and intact distal pulses.   Pulmonary/Chest: Effort normal and breath sounds normal.  Abdominal: Soft. Bowel sounds are normal.  Musculoskeletal: Normal range of motion.  Neurological: He is alert and oriented to person, place, and time.  Skin: Skin is warm and dry.  Psychiatric: He has a normal mood and affect. His behavior is normal. Judgment and thought content normal.    There were no vitals taken for this visit.      Assessment & Plan:  1. Essential hypertension Add diuretic for edema  2. HLD  (hyperlipidemia)  - Lipid panel - CMP14+EGFR  3. BPH (benign prostatic hyperplasia) -PSA  Wardell Honour MD

## 2014-07-21 NOTE — Addendum Note (Signed)
Addended by: Selmer Dominion on: 07/21/2014 02:10 PM   Modules accepted: Orders

## 2014-07-22 LAB — CMP14+EGFR
ALT: 15 IU/L (ref 0–44)
AST: 24 IU/L (ref 0–40)
Albumin/Globulin Ratio: 1.8 (ref 1.1–2.5)
Albumin: 3.9 g/dL (ref 3.5–4.8)
Alkaline Phosphatase: 68 IU/L (ref 39–117)
BUN/Creatinine Ratio: 11 (ref 10–22)
BUN: 14 mg/dL (ref 8–27)
CALCIUM: 9.4 mg/dL (ref 8.6–10.2)
CO2: 30 mmol/L — AB (ref 18–29)
CREATININE: 1.29 mg/dL — AB (ref 0.76–1.27)
Chloride: 103 mmol/L (ref 97–108)
GFR calc Af Amer: 63 mL/min/{1.73_m2} (ref >59)
GFR calc non Af Amer: 54 mL/min/{1.73_m2} — ABNORMAL LOW (ref >59)
GLOBULIN, TOTAL: 2.2 g/dL (ref 1.5–4.5)
GLUCOSE: 96 mg/dL (ref 65–99)
Potassium: 4.2 mmol/L (ref 3.5–5.2)
Sodium: 144 mmol/L (ref 134–144)
TOTAL PROTEIN: 6.1 g/dL (ref 6.0–8.5)
Total Bilirubin: 0.4 mg/dL (ref 0.0–1.2)

## 2014-07-22 LAB — LIPID PANEL
CHOL/HDL RATIO: 2.9 ratio (ref 0.0–5.0)
Cholesterol, Total: 112 mg/dL (ref 100–199)
HDL: 38 mg/dL — ABNORMAL LOW (ref >39)
LDL Calculated: 55 mg/dL (ref 0–99)
Triglycerides: 94 mg/dL (ref 0–149)
VLDL Cholesterol Cal: 19 mg/dL (ref 5–40)

## 2014-07-25 ENCOUNTER — Telehealth: Payer: Self-pay | Admitting: Family Medicine

## 2014-07-25 DIAGNOSIS — E785 Hyperlipidemia, unspecified: Secondary | ICD-10-CM

## 2014-07-25 MED ORDER — ROSUVASTATIN CALCIUM 20 MG PO TABS: ORAL_TABLET | ORAL | Status: DC

## 2014-07-25 MED ORDER — CARVEDILOL 3.125 MG PO TABS: ORAL_TABLET | ORAL | Status: DC

## 2014-07-25 NOTE — Telephone Encounter (Signed)
Aware , refills sent to pharmacy.

## 2014-08-29 DIAGNOSIS — Z86010 Personal history of colonic polyps: Secondary | ICD-10-CM | POA: Diagnosis not present

## 2014-09-02 DIAGNOSIS — N400 Enlarged prostate without lower urinary tract symptoms: Secondary | ICD-10-CM | POA: Diagnosis not present

## 2014-09-02 DIAGNOSIS — I10 Essential (primary) hypertension: Secondary | ICD-10-CM | POA: Diagnosis not present

## 2014-09-02 DIAGNOSIS — K5730 Diverticulosis of large intestine without perforation or abscess without bleeding: Secondary | ICD-10-CM | POA: Diagnosis not present

## 2014-09-02 DIAGNOSIS — Z79899 Other long term (current) drug therapy: Secondary | ICD-10-CM | POA: Diagnosis not present

## 2014-09-02 DIAGNOSIS — Z1211 Encounter for screening for malignant neoplasm of colon: Secondary | ICD-10-CM | POA: Diagnosis not present

## 2014-09-02 DIAGNOSIS — K648 Other hemorrhoids: Secondary | ICD-10-CM | POA: Diagnosis not present

## 2014-09-02 DIAGNOSIS — Z86010 Personal history of colonic polyps: Secondary | ICD-10-CM | POA: Diagnosis not present

## 2014-09-02 DIAGNOSIS — D123 Benign neoplasm of transverse colon: Secondary | ICD-10-CM | POA: Diagnosis not present

## 2014-09-02 DIAGNOSIS — E785 Hyperlipidemia, unspecified: Secondary | ICD-10-CM | POA: Diagnosis not present

## 2014-09-07 DIAGNOSIS — K635 Polyp of colon: Secondary | ICD-10-CM | POA: Diagnosis not present

## 2014-09-15 DIAGNOSIS — N200 Calculus of kidney: Secondary | ICD-10-CM | POA: Diagnosis not present

## 2014-09-15 DIAGNOSIS — R1013 Epigastric pain: Secondary | ICD-10-CM | POA: Diagnosis not present

## 2014-10-13 ENCOUNTER — Encounter: Payer: Self-pay | Admitting: Family

## 2014-10-14 ENCOUNTER — Ambulatory Visit (HOSPITAL_COMMUNITY)
Admission: RE | Admit: 2014-10-14 | Discharge: 2014-10-14 | Disposition: A | Discharge status: Home / Self Care | Payer: Commercial Managed Care - HMO | Source: Ambulatory Visit | Attending: Family | Admitting: Family

## 2014-10-14 ENCOUNTER — Ambulatory Visit (INDEPENDENT_AMBULATORY_CARE_PROVIDER_SITE_OTHER): Payer: Commercial Managed Care - HMO | Admitting: Family

## 2014-10-14 ENCOUNTER — Encounter: Payer: Self-pay | Admitting: Family

## 2014-10-14 VITALS — BP 162/90 | HR 65 | Resp 16 | Ht 71.0 in | Wt 220.0 lb

## 2014-10-14 DIAGNOSIS — I6523 Occlusion and stenosis of bilateral carotid arteries: Secondary | ICD-10-CM

## 2014-10-14 DIAGNOSIS — Z87891 Personal history of nicotine dependence: Secondary | ICD-10-CM | POA: Diagnosis not present

## 2014-10-14 DIAGNOSIS — R0789 Other chest pain: Secondary | ICD-10-CM | POA: Insufficient documentation

## 2014-10-14 NOTE — Patient Instructions (Signed)
Stroke Prevention Some medical conditions and behaviors are associated with an increased chance of having a stroke. You may prevent a stroke by making healthy choices and managing medical conditions. HOW CAN I REDUCE MY RISK OF HAVING A STROKE?   Stay physically active. Get at least 30 minutes of activity on most or all days.  Do not smoke. It may also be helpful to avoid exposure to secondhand smoke.  Limit alcohol use. Moderate alcohol use is considered to be:  No more than 2 drinks per day for men.  No more than 1 drink per day for nonpregnant women.  Eat healthy foods. This involves:  Eating 5 or more servings of fruits and vegetables a day.  Making dietary changes that address high blood pressure (hypertension), high cholesterol, diabetes, or obesity.  Manage your cholesterol levels.  Making food choices that are high in fiber and low in saturated fat, trans fat, and cholesterol may control cholesterol levels.  Take any prescribed medicines to control cholesterol as directed by your health care provider.  Manage your diabetes.  Controlling your carbohydrate and sugar intake is recommended to manage diabetes.  Take any prescribed medicines to control diabetes as directed by your health care provider.  Control your hypertension.  Making food choices that are low in salt (sodium), saturated fat, trans fat, and cholesterol is recommended to manage hypertension.  Take any prescribed medicines to control hypertension as directed by your health care provider.  Maintain a healthy weight.  Reducing calorie intake and making food choices that are low in sodium, saturated fat, trans fat, and cholesterol are recommended to manage weight.  Stop drug abuse.  Avoid taking birth control pills.  Talk to your health care provider about the risks of taking birth control pills if you are over 35 years old, smoke, get migraines, or have ever had a blood clot.  Get evaluated for sleep  disorders (sleep apnea).  Talk to your health care provider about getting a sleep evaluation if you snore a lot or have excessive sleepiness.  Take medicines only as directed by your health care provider.  For some people, aspirin or blood thinners (anticoagulants) are helpful in reducing the risk of forming abnormal blood clots that can lead to stroke. If you have the irregular heart rhythm of atrial fibrillation, you should be on a blood thinner unless there is a good reason you cannot take them.  Understand all your medicine instructions.  Make sure that other conditions (such as anemia or atherosclerosis) are addressed. SEEK IMMEDIATE MEDICAL CARE IF:   You have sudden weakness or numbness of the face, arm, or leg, especially on one side of the body.  Your face or eyelid droops to one side.  You have sudden confusion.  You have trouble speaking (aphasia) or understanding.  You have sudden trouble seeing in one or both eyes.  You have sudden trouble walking.  You have dizziness.  You have a loss of balance or coordination.  You have a sudden, severe headache with no known cause.  You have new chest pain or an irregular heartbeat. Any of these symptoms may represent a serious problem that is an emergency. Do not wait to see if the symptoms will go away. Get medical help at once. Call your local emergency services (911 in [place] Do not drive yourself to the hospital. Document Released: 09/12/2004 Document Revised: 12/20/2013 Document Reviewed: 02/05/2013 Exit[employer] Patient Information 2015 [employer]. This information is not intended to replace advice given   to you by your health care provider. Make sure you discuss any questions you have with your health care provider.  

## 2014-10-14 NOTE — Progress Notes (Signed)
Established Carotid Patient   History of Present Illness  Hector Torres is a 74 y.o. male patient of Dr. Bridgett Larsson who presents with chief complaint: routine follow up for carotid artery stenosis. Previous carotid studies demonstrated: RICA 48-18% stenosis, LICA 56-31% stenosis. Patient has distant history of TIA symptom: years ago notable for L hand weakness. The patient has never had amaurosis fugax or monocular blindness. The patient has had transient right upper extremity plegia. The patient has had transient expressive aphasia. The patient's previous neurologic deficits have resolved. This patient has also had prior studies concerning for L subclavian artery steal. The patient denies any left arm complaints. He has never had claudication equivalent sx.  Pt denies tingling, numbness, pain, or weakness in either hand/arm, denies dizziness.   Pt states his blood pressure normally is about 120/70, his blood pressure gets high in a medical office. The patient reports chest pain and pressure, states his PCP is aware, had a GI work up. Also had cardiac work up that was negative. Zantac has mostly relieved the chest pressure. He gets pain in his hips with walking, has occasional back pain, denies non healing wounds.   Pt Diabetic: No Pt smoker: former smoker, quit in 2014  Pt meds include: Statin : Yes ASA: Yes Other anticoagulants/antiplatelets: no   Past Medical History  Diagnosis Date  . Carotid artery disease     Bilateral - Dr. Bridgett Larsson  . PVD (peripheral vascular disease)   . TIA (transient ischemic attack)   . Coronary atherosclerosis of native coronary artery     PTCA circumflex at [facility]  . Essential hypertension, benign     Possible white coat  . Mixed hyperlipidemia   . Prostate nodule   . Vitamin D deficiency   . Stroke     Social History History  Substance Use Topics  . Smoking status: Former Smoker -- 1.50 packs/day for 60 years    Types: Cigarettes    Quit date:  02/27/2011  . Smokeless tobacco: Never Used  . Alcohol Use: No    Family History Family History  Problem Relation Age of Onset  . Gastric cancer Mother   . Varicose Veins Mother   . Cancer Mother     Stomach  . Prostate cancer Father   . Cancer Father     Prostate  . Other Brother     carotid artery stenosis  . Diabetes Brother   . Leukemia Brother   . Hyperlipidemia Brother   . Hypertension Brother   . Colon cancer Brother   . Hyperlipidemia Brother   . Peripheral vascular disease Brother   . Cancer Brother     Colon and Prostate  . Hypertension Sister   . Hyperlipidemia Sister   . Diabetes Daughter   . Diabetes Son   . Prostate cancer Brother     Surgical History Past Surgical History  Procedure Laterality Date  . Eye surgery      Bilateral cataract  . Appendectomy  1947  . Tonsillectomy  1968  . Traumatic injury  1962    Lost tip of index, middle, and ring finger/ right hand  . Angioplasty Left 1995    Cir. Artery    No Known Allergies  Current Outpatient Prescriptions  Medication Sig Dispense Refill  . acetaminophen (TYLENOL) 325 MG tablet Take 650 mg by mouth every 6 (six) hours as needed.      . ALPRAZolam (XANAX XR) 3 MG 24 hr tablet Take 3 mg by mouth  2 (two) times daily at 8 am and 10 pm.    . aspirin 81 MG tablet Take 81 mg by mouth daily.    . carvedilol (COREG) 3.125 MG tablet TAKE 1 TABLET BY MOUTH TWICE A DAY 60 tablet 11  . Cholecalciferol (VITAMIN D) 1000 UNITS capsule Take 5,000 Units by mouth 2 (two) times daily. Take 3 capsules daily    . finasteride (PROSCAR) 5 MG tablet Take 1 tablet by mouth daily.    . hydrochlorothiazide (HYDRODIURIL) 25 MG tablet Take 1 tablet (25 mg total) by mouth daily. (Patient taking differently: Take 25 mg by mouth as needed. ) 30 tablet 2  . losartan (COZAAR) 25 MG tablet Take 1 tablet (25 mg total) by mouth daily. 90 tablet 3  . Multiple Vitamin (MULTIVITAMIN PO) Take 1 tablet by mouth daily.      .  nitroGLYCERIN (NITROSTAT) 0.4 MG SL tablet Place 1 tablet (0.4 mg total) under the tongue every 5 (five) minutes as needed for chest pain. (Patient taking differently: Place 0.4 mg under the tongue as needed for chest pain. ) 25 tablet 3  . rosuvastatin (CRESTOR) 20 MG tablet TAKE 1 TABLET BY MOUTH EVERY DAY 30 tablet 11  . tamsulosin (FLOMAX) 0.4 MG CAPS capsule Take 0.4 mg by mouth daily after supper.    . vitamin E 400 UNIT capsule Take 400 Units by mouth daily.      Marland Kitchen aspirin 325 MG EC tablet Take 325 mg by mouth daily.     No current facility-administered medications for this visit.    Review of Systems : See HPI for pertinent positives and negatives.  Physical Examination  Filed Vitals:   10/14/14 1221 10/14/14 1225  BP: 193/103 162/90  Pulse: 66 65  Resp:  16  Height:  5\' 11"  (1.803 m)  Weight:  220 lb (99.791 kg)  SpO2:  98%   Body mass index is 30.7 kg/(m^2).  General: A&O x 3, WD, Obese male   Eyes: PERRLA  Pulmonary: Sym exp, good air movt, CTAB, no rales, rhonchi, & wheezing  Cardiac: RRR, Nl S1, S2, + murmur  Vascular: Vessel Right Left  Radial Palpable Palpable  Carotid Transmitted cardiac murmur Transmitted cardiac murmur  Aorta Not palpable N/A  Popliteal Not palpable Not palpable  PT Palpable Palpable  DP Palpable Palpable   Gastrointestinal: soft, NTND, -G/R, - HSM, - palpable masses, - CVAT B  Musculoskeletal: M/S 5/5 throughout , Extremities without ischemic changes   Neurologic: CN 2-12 intact , Pain and light touch intact in extremities , Motor exam as listed above         Non-Invasive Vascular Imaging CAROTID DUPLEX 10/14/2014   CEREBROVASCULAR DUPLEX EVALUATION    INDICATION: Carotid artery disease     PREVIOUS INTERVENTION(S):     DUPLEX EXAM:     RIGHT  LEFT  Peak Systolic Velocities (cm/s) End Diastolic Velocities (cm/s) Plaque LOCATION Peak Systolic Velocities (cm/s) End Diastolic Velocities (cm/s) Plaque   46 6  CCA PROXIMAL 53 14   48 7  CCA MID 53 14 HT  43 8 HT CCA DISTAL 55 11 HT  113 11 HT ECA 81 11   105 39 HT ICA PROXIMAL 154 44 HT  123 31  ICA MID 220 65   97 20  ICA DISTAL 172 46     2.56 ICA / CCA Ratio (PSV) 4.15  Antegrade  Vertebral Flow Bidirectional  161 Brachial Systolic Pressure (mmHg) 096  Triphasic  Brachial  Artery Waveforms Triphasic     Plaque Morphology:  HM = Homogeneous, HT = Heterogeneous, CP = Calcific Plaque, SP = Smooth Plaque, IP = Irregular Plaque     ADDITIONAL FINDINGS: Greater than 63mm/Mg difference in brachial pressures. Left subclavian artery stenosis.    IMPRESSION: Right internal carotid artery velocities suggest a <40% stenosis. Left internal carotid artery velocities suggest a 60-79% stenosis.      Compared to the previous exam:  No significant change in comparison to the last exam on 04/08/2014.     Assessment: Hector Torres is a 74 y.o. male who has a remote history of TIA. Today's carotid Duplex reveals  <40% right ICA stenosis and 60-79% left ICA stenosis. No significant change in comparison to the last exam on 04/08/2014.  Plan: Follow-up in 6 months with Carotid Duplex.   I discussed in depth with the patient the nature of atherosclerosis, and emphasized the importance of maximal medical management including strict control of blood pressure, blood glucose, and lipid levels, obtaining regular exercise, and continued cessation of smoking.  The patient is aware that without maximal medical management the underlying atherosclerotic disease process will progress, limiting the benefit of any interventions. The patient was given information about stroke prevention and what symptoms should prompt the patient to seek immediate medical care. Thank you for allowing [place] to participate in this patient's care.  Clemon Chambers, RN, MSN, FNP-C [facility] Office: [phone]  Clinic Physician: Bridgett Larsson  10/14/2014  12:32 PM

## 2014-10-17 ENCOUNTER — Other Ambulatory Visit: Payer: Self-pay | Admitting: *Deleted

## 2014-10-17 MED ORDER — HYDROCHLOROTHIAZIDE 25 MG PO TABS: 25.0000 mg | ORAL_TABLET | Freq: Every day | ORAL | Status: DC

## 2014-10-24 ENCOUNTER — Other Ambulatory Visit: Payer: Self-pay | Admitting: Cardiology

## 2014-11-06 IMAGING — NM NM MYOCAR SINGLE W/SPECT W/WALL MOTION & EF
1 series · 6 of 6 positions shown · non-contrast
Comparison: none

CLINICAL DATA: 72-year-old male with a history of CAD status post
carotid artery disease. He is reporting angina symptoms, and this
study is requested to evaluate for the presence and extent of
ischemia.

EXAM:
MYOCARDIAL IMAGING WITH SPECT (REST AND PHARMACOLOGIC-STRESS)
GATED LEFT VENTRICULAR WALL MOTION STUDY
LEFT VENTRICULAR EJECTION FRACTION
TECHNIQUE: Standard myocardial SPECT imaging was performed after resting
intravenous injection of 10 mCi Yc-HHm sestamibi. Subsequently,
intravenous infusion of Lexiscan was performed under the supervision
of the Cardiology staff. At peak effect of the drug, 30 mCi Yc-HHm
sestamibi was injected intravenously and standard myocardial SPECT
imaging was performed. Quantitative gated imaging was also performed
to evaluate left ventricular wall motion, and estimate left
ventricular ejection fraction.

[Series 1: cs cardiac tc hi dose · 6.41mm/px · 6 of 512 frames shown]
[frame 43/512]
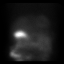
[frame 128/512]
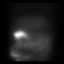
[frame 214/512]
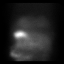
[frame 299/512]
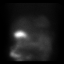
[frame 384/512]
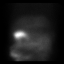
[frame 470/512]
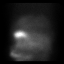

[6 of 6 positions shown; findings below may reference images not displayed]

FINDINGS: Baseline tracing shows sinus rhythm with inferolateral ST segment
depression and nonspecific T-wave inversion, poor R wave progression
anteriorly. Lexiscan bolus was given in standard fashion. Heart rate
increased from 63 beats per min up to 80 beats per min, and blood
pressure increased from 150/91 up to 162/85. No chest pain was
reported. There was accentuation in resting ST segment
abnormalities, however not of diagnostic degree over baseline.
Occasional PVCs noted.

Analysis of the raw perfusion data finds adequate radiotracer
uptake.

Tomographic views were obtained using the short axis, vertical long
axis, and horizontal long axis planes. There is a small, mild
intensity, anteroseptal defect noted that is partially reversible in
the mid to apical segment. Suggest mild ischemia and potentially
even some component of scar.

Gated imaging reveals an EDV of 114, ESV of 56, TID ratio of 1.11,
and LVEF of 51% with possible inferoseptal hypokinesis.
IMPRESSION: Abnormal, but overall low risk Lexiscan Cardiolite in the setting of
known ischemic heart disease. There are baseline ST segment
abnormalities without diagnostic accentuation with Lexiscan. Rare
PVCs noted. Perfusion imaging suggests possible mild anteroseptal
ischemia at the mid to apical level with some component of scar.
There are no large ischemic territories. LVEF is calculated at 51%
with possible inferoseptal hypokinesis and normal volumes.

## 2015-01-19 DIAGNOSIS — N401 Enlarged prostate with lower urinary tract symptoms: Secondary | ICD-10-CM | POA: Diagnosis not present

## 2015-01-19 DIAGNOSIS — R351 Nocturia: Secondary | ICD-10-CM | POA: Diagnosis not present

## 2015-01-23 ENCOUNTER — Other Ambulatory Visit: Payer: Self-pay | Admitting: *Deleted

## 2015-01-23 MED ORDER — HYDROCHLOROTHIAZIDE 25 MG PO TABS: 25.0000 mg | ORAL_TABLET | Freq: Every day | ORAL | Status: DC

## 2015-01-26 ENCOUNTER — Encounter: Payer: Self-pay | Admitting: Family Medicine

## 2015-01-26 ENCOUNTER — Ambulatory Visit (INDEPENDENT_AMBULATORY_CARE_PROVIDER_SITE_OTHER): Payer: Commercial Managed Care - HMO | Admitting: Family Medicine

## 2015-01-26 ENCOUNTER — Ambulatory Visit (INDEPENDENT_AMBULATORY_CARE_PROVIDER_SITE_OTHER): Payer: Commercial Managed Care - HMO

## 2015-01-26 VITALS — BP 128/84 | HR 72 | Temp 97.3°F | Ht 71.0 in | Wt 209.0 lb

## 2015-01-26 DIAGNOSIS — I10 Essential (primary) hypertension: Secondary | ICD-10-CM | POA: Diagnosis not present

## 2015-01-26 DIAGNOSIS — N400 Enlarged prostate without lower urinary tract symptoms: Secondary | ICD-10-CM | POA: Insufficient documentation

## 2015-01-26 DIAGNOSIS — R0789 Other chest pain: Secondary | ICD-10-CM

## 2015-01-26 DIAGNOSIS — Z87442 Personal history of urinary calculi: Secondary | ICD-10-CM | POA: Insufficient documentation

## 2015-01-26 DIAGNOSIS — E785 Hyperlipidemia, unspecified: Secondary | ICD-10-CM

## 2015-01-26 MED ORDER — HYDROCHLOROTHIAZIDE 25 MG PO TABS: 25.0000 mg | ORAL_TABLET | Freq: Every day | ORAL | Status: DC

## 2015-01-26 NOTE — Progress Notes (Signed)
Subjective:    Patient ID: Hector Torres, male    DOB: 1941-01-18, 74 y.o.   MRN: [id number]  HPI 74 year old gentleman here to follow-up hypertension, hyperlipidemia, and CAD. He was seen recently by vascular surgeon who did carotid Doppler ultrasound. No specific recommendations came from that visit except to control blood pressure and lipids which are in good control today.  He does complain of some substernal chest pain. Does not seem to be related to exercise or activity but it is associated with weakness. It is not relieved by nitroglycerin but there is some question as to the efficacy of nitroglycerin since it does not cause any symptoms or side effects when he uses it. He has a past history of angioplasty and had a stress test about one year ago. The fact that this symptom is worsening is concerning and I would like to get him back to see cardiologist  Patient Active Problem List   Diagnosis Date Noted  . Kidney stone   . BPH (benign prostatic hypertrophy)   . Other chest pain 10/14/2014  . Chest pressure 10/14/2014  . TIA (transient ischemic attack)   . Prostate nodule   . PVD (peripheral vascular disease)   . Vitamin D deficiency   . HTN (hypertension) 12/25/2012  . HLD (hyperlipidemia) 12/25/2012  . Screening for prostate cancer 12/25/2012  . Occlusion and stenosis of carotid artery without mention of cerebral infarction 04/03/2012  . Coronary atherosclerosis of native coronary artery 04/15/2011  . Mixed hyperlipidemia 04/15/2011   Outpatient Encounter Prescriptions as of 01/26/2015  Medication Sig  . acetaminophen (TYLENOL) 325 MG tablet Take 650 mg by mouth every 6 (six) hours as needed.    Marland Kitchen aspirin 81 MG tablet Take 81 mg by mouth daily.  . carvedilol (COREG) 3.125 MG tablet TAKE 1 TABLET BY MOUTH TWICE A DAY  . Cholecalciferol (VITAMIN D) 1000 UNITS capsule Take 5,000 Units by mouth 2 (two) times daily. Take 3 capsules daily  . finasteride (PROSCAR) 5 MG tablet Take 1  tablet by mouth daily.  . hydrochlorothiazide (HYDRODIURIL) 25 MG tablet Take 1 tablet (25 mg total) by mouth daily.  Marland Kitchen losartan (COZAAR) 25 MG tablet TAKE 1 TABLET BY MOUTH DAILY  . Multiple Vitamin (MULTIVITAMIN PO) Take 1 tablet by mouth daily.    . nitroGLYCERIN (NITROSTAT) 0.4 MG SL tablet Place 1 tablet (0.4 mg total) under the tongue every 5 (five) minutes as needed for chest pain. (Patient taking differently: Place 0.4 mg under the tongue as needed for chest pain. )  . ranitidine (ZANTAC) 150 MG capsule Take 150 mg by mouth 2 (two) times daily.  . rosuvastatin (CRESTOR) 20 MG tablet TAKE 1 TABLET BY MOUTH EVERY DAY  . tamsulosin (FLOMAX) 0.4 MG CAPS capsule Take 0.4 mg by mouth daily after supper.  . vitamin E 400 UNIT capsule Take 400 Units by mouth daily.    . [DISCONTINUED] aspirin 325 MG EC tablet Take 325 mg by mouth daily.   No facility-administered encounter medications on file as of 01/26/2015.      Review of Systems  Constitutional: Positive for fatigue.  HENT: Negative.   Respiratory: Negative.   Cardiovascular: Positive for chest pain.  Neurological: Negative.   Psychiatric/Behavioral: Negative.        Objective:   Physical Exam  Constitutional: He is oriented to person, place, and time. He appears well-developed and well-nourished.  HENT:  Head: Normocephalic.  Cardiovascular: Normal rate and regular rhythm.   Pulmonary/Chest: Effort normal  and breath sounds normal.  Musculoskeletal: Normal range of motion.  Neurological: He is alert and oriented to person, place, and time.  Psychiatric: He has a normal mood and affect. His behavior is normal.     BP 128/84 mmHg  Pulse 72  Temp(Src) 97.3 F (36.3 C) (Oral)  Ht 5\' 11"  (1.803 m)  Wt 209 lb (94.802 kg)  BMI 29.16 kg/m2      Assessment & Plan:  1. HLD (hyperlipidemia) Lipids are optimally managed with LDL 55  2. Essential hypertension Blood pressure doing much better with addition of  hydrochlorothiazide  3. BPH (benign prostatic hypertrophy) We'll check PSA at the request of urology - PSA Total+%Free (Serial)  4. Other chest pain Chest pain as he describes it is worrisome especially with history of CAD will seek cardiology consultation - DG Chest 2 View; Future - Ambulatory referral to Cardiology

## 2015-01-27 LAB — PSA TOTAL+% FREE (SERIAL)
PSA, Free Pct: 90 %
PSA, Free: 0.09 ng/mL
Prostate Specific Ag, Serum: 0.1 ng/mL (ref 0.0–4.0)

## 2015-01-30 ENCOUNTER — Telehealth: Payer: Self-pay | Admitting: Cardiology

## 2015-01-30 ENCOUNTER — Encounter: Payer: Self-pay | Admitting: Cardiology

## 2015-01-30 ENCOUNTER — Ambulatory Visit (INDEPENDENT_AMBULATORY_CARE_PROVIDER_SITE_OTHER): Payer: Commercial Managed Care - HMO | Admitting: Cardiology

## 2015-01-30 ENCOUNTER — Encounter: Payer: Self-pay | Admitting: *Deleted

## 2015-01-30 ENCOUNTER — Other Ambulatory Visit: Payer: Self-pay | Admitting: Cardiology

## 2015-01-30 VITALS — BP 128/80 | HR 75 | Ht 70.0 in | Wt 209.0 lb

## 2015-01-30 DIAGNOSIS — I25119 Atherosclerotic heart disease of native coronary artery with unspecified angina pectoris: Secondary | ICD-10-CM | POA: Diagnosis not present

## 2015-01-30 DIAGNOSIS — I200 Unstable angina: Secondary | ICD-10-CM | POA: Diagnosis not present

## 2015-01-30 DIAGNOSIS — E782 Mixed hyperlipidemia: Secondary | ICD-10-CM

## 2015-01-30 DIAGNOSIS — I10 Essential (primary) hypertension: Secondary | ICD-10-CM | POA: Diagnosis not present

## 2015-01-30 DIAGNOSIS — I6523 Occlusion and stenosis of bilateral carotid arteries: Secondary | ICD-10-CM | POA: Diagnosis not present

## 2015-01-30 NOTE — Patient Instructions (Signed)
Your physician recommends that you continue on your current medications as directed. Please refer to the Current Medication list given to you today. Your physician has requested that you have a cardiac catheterization. Cardiac catheterization is used to diagnose and/or treat various heart conditions. Doctors may recommend this procedure for a number of different reasons. The most common reason is to evaluate chest pain. Chest pain can be a symptom of coronary artery disease (CAD), and cardiac catheterization can show whether plaque is narrowing or blocking your heart's arteries. This procedure is also used to evaluate the valves, as well as measure the blood flow and oxygen levels in different parts of your heart. For further information please visit www.[email or web address]. Please follow instruction sheet, as given.   

## 2015-01-30 NOTE — Progress Notes (Signed)
Cardiology Office Note  Date: 01/30/2015   ID: Hector Torres, DOB 1940/11/24, MRN [id number]  PCP: Redge Gainer, MD  Primary Cardiologist: Rozann Lesches, MD   Chief Complaint  Patient presents with  . Coronary Artery Disease  . Hyperlipidemia    History of Present Illness: Hector Torres is a 74 y.o. male last seen in June 2015. He presents for a routine follow-up visit. Since I last saw him, he reports having increasing episodes of angina. Most recent ischemic testing from last year is outlined below. He had an area of mild anteroseptal ischemia in the mid to apical level with associated scar, LVEF 51%. At that point he was managed medically reporting adequate symptom control.  Follow-up ECG today shows sinus rhythm with anteroseptal Q waves and inferolateral ST-T wave abnormalities. These are fairly similar to his tracing from last year.  He continues to follow with Dr. Bridgett Larsson for carotid artery disease, recent carotid Dopplers showed less than 40% RICA stenosis and 03-54% LICA stenosis.  Today we discussed his cardiac medications, also possibility of proceeding to a diagnostic heart catheterization to more clearly evaluate his coronary anatomy for potential revascularization options. In light of his progressing symptoms, he is in agreement to proceed.   Past Medical History  Diagnosis Date  . Carotid artery disease     Bilateral - Dr. Bridgett Larsson  . PVD (peripheral vascular disease)   . TIA (transient ischemic attack)   . Coronary atherosclerosis of native coronary artery     PTCA circumflex at [facility]  . Essential hypertension, benign     Possible white coat  . Mixed hyperlipidemia   . Prostate nodule   . Vitamin D deficiency   . Stroke   . Colon polyp   . Kidney stone   . BPH (benign prostatic hypertrophy)     Past Surgical History  Procedure Laterality Date  . Eye surgery      Bilateral cataract  . Appendectomy  1947  . Tonsillectomy  1968  . Traumatic injury   1962    Lost tip of index, middle, and ring finger/ right hand  . Angioplasty Left 1995    Cir. Artery    Current Outpatient Prescriptions  Medication Sig Dispense Refill  . acetaminophen (TYLENOL) 325 MG tablet Take 650 mg by mouth every 6 (six) hours as needed.      Marland Kitchen aspirin 81 MG tablet Take 81 mg by mouth daily.    . carvedilol (COREG) 3.125 MG tablet TAKE 1 TABLET BY MOUTH TWICE A DAY 60 tablet 11  . Cholecalciferol (VITAMIN D) 1000 UNITS capsule Take 5,000 Units by mouth 2 (two) times daily. Take 3 capsules daily    . finasteride (PROSCAR) 5 MG tablet Take 1 tablet by mouth daily.    . hydrochlorothiazide (HYDRODIURIL) 25 MG tablet Take 1 tablet (25 mg total) by mouth daily. 30 tablet 5  . losartan (COZAAR) 25 MG tablet TAKE 1 TABLET BY MOUTH DAILY 90 tablet 3  . Multiple Vitamin (MULTIVITAMIN PO) Take 1 tablet by mouth daily.      . nitroGLYCERIN (NITROSTAT) 0.4 MG SL tablet Place 1 tablet (0.4 mg total) under the tongue every 5 (five) minutes as needed for chest pain. (Patient taking differently: Place 0.4 mg under the tongue as needed for chest pain. ) 25 tablet 3  . ranitidine (ZANTAC) 150 MG capsule Take 150 mg by mouth 2 (two) times daily.    . rosuvastatin (CRESTOR) 20 MG tablet TAKE 1 TABLET  BY MOUTH EVERY DAY 30 tablet 11  . tamsulosin (FLOMAX) 0.4 MG CAPS capsule Take 0.4 mg by mouth daily after supper.    . vitamin E 400 UNIT capsule Take 400 Units by mouth daily.       No current facility-administered medications for this visit.    Allergies:  Review of patient's allergies indicates no known allergies.   Social History: The patient  reports that he quit smoking about 3 years ago. His smoking use included Cigarettes. He has a 90 pack-year smoking history. He has never used smokeless tobacco. He reports that he does not drink alcohol or use illicit drugs.   ROS:  Please see the history of present illness. Otherwise, complete review of systems is positive for none.  All  other systems are reviewed and negative.   Physical Exam: VS:  BP 128/80 mmHg  Pulse 75  Ht 5\' 10"  (1.778 m)  Wt 209 lb (94.802 kg)  BMI 29.99 kg/m2  SpO2 98%, BMI Body mass index is 29.99 kg/(m^2).  Wt Readings from Last 3 Encounters:  01/30/15 209 lb (94.802 kg)  01/26/15 209 lb (94.802 kg)  10/14/14 220 lb (99.791 kg)     General: Patient appears comfortable at rest. HEENT: Conjunctiva and lids normal, oropharynx clear with moist mucosa. Neck: Supple, no elevated JVP or carotid bruits, no thyromegaly. Lungs: Clear to auscultation, nonlabored breathing at rest. Cardiac: Regular rate and rhythm, no S3 or significant systolic murmur, no pericardial rub. Abdomen: Soft, nontender, no hepatomegaly, bowel sounds present, no guarding or rebound. Extremities: No pitting edema, distal pulses 2+. Skin: Warm and dry. Musculoskeletal: No kyphosis. Neuropsychiatric: Alert and oriented x3, affect grossly appropriate.   ECG: ECG is ordered today.   Recent Labwork: 07/21/2014: ALT 15; AST 24; BUN 14; Creatinine, Ser 1.29*; Potassium 4.2; Sodium 144     Component Value Date/Time   CHOL 112 07/21/2014 1044   CHOL 102 12/25/2012 1251   TRIG 94 07/21/2014 1044   TRIG 82 12/25/2012 1251   HDL 38* 07/21/2014 1044   HDL 36* 12/25/2012 1251   CHOLHDL 2.9 07/21/2014 1044   LDLCALC 55 07/21/2014 1044   LDLCALC 50 12/25/2012 1251    Other Studies Reviewed Today:  Lexiscan Cardiolite 11/04/2013: FINDINGS: Baseline tracing shows sinus rhythm with inferolateral ST segment depression and nonspecific T-wave inversion, poor R wave progression anteriorly. Lexiscan bolus was given in standard fashion. Heart rate increased from 63 beats per min up to 80 beats per min, and blood pressure increased from 150/91 up to 162/85. No chest pain was reported. There was accentuation in resting ST segment abnormalities, however not of diagnostic degree over baseline. Occasional PVCs noted.  Analysis of  the raw perfusion data finds adequate radiotracer uptake.  Tomographic views were obtained using the short axis, vertical long axis, and horizontal long axis planes. There is a small, mild intensity, anteroseptal defect noted that is partially reversible in the mid to apical segment. Suggest mild ischemia and potentially even some component of scar.  Gated imaging reveals an EDV of 114, ESV of 56, TID ratio of 1.11, and LVEF of 51% with possible inferoseptal hypokinesis.  IMPRESSION: Abnormal, but overall low risk Lexiscan Cardiolite in the setting of known ischemic heart disease. There are baseline ST segment abnormalities without diagnostic accentuation with Lexiscan. Rare PVCs noted. Perfusion imaging suggests possible mild anteroseptal ischemia at the mid to apical level with some component of scar. There are no large ischemic territories. LVEF is calculated at 51% with  possible inferoseptal hypokinesis and normal volumes.   Assessment and Plan:  1. CAD status post PTCA to the circumflex at [facility] in 1995. He is reporting accelerating angina symptoms on medical therapy, ischemic testing from last year is outlined above indicating region of mild anteroseptal ischemia in the mid to apical zone with some degree of scar. He almost certainly has had progression in CAD over the years, and in light of his progressing symptoms we have discussed proceeding to a diagnostic cardiac catheterization. He is in agreement to proceed.  2. Carotid artery disease as outlined above. He continues on aspirin and statin therapy.  3. Hyperlipidemia, on Crestor. LDL 55 in December 2015.  4. History of hypertension, blood pressure control is good today.  Current medicines were reviewed with the patient today.   Orders Placed This Encounter  Procedures  . EKG 12-Lead    Disposition: FU with me after cardiac catheterization.   Signed, Satira Sark, MD, [facility] 01/30/2015 10:47 AM    [facility] [place], [place], [place] [address] Phone: [phone]; Fax: [phone]

## 2015-01-30 NOTE — Telephone Encounter (Signed)
In progress

## 2015-01-30 NOTE — Telephone Encounter (Signed)
Left heart cath on medications at [facility] on 02/03/15 @10 :30 am with Dr. Burt Knack dx: CAD, accelerating angina & abnormal cardiolite

## 2015-02-01 ENCOUNTER — Telehealth: Payer: Self-pay

## 2015-02-01 NOTE — Telephone Encounter (Signed)
Received notification that they needed a [facility] referral for Dr Rozann Lesches DOS 01/30/15  Got approval # today but can't back date a referral  [facility]  #[id number]

## 2015-02-03 ENCOUNTER — Ambulatory Visit (HOSPITAL_COMMUNITY)
Admission: RE | Admit: 2015-02-03 | Discharge: 2015-02-03 | Disposition: A | Discharge status: Home / Self Care | Payer: Commercial Managed Care - HMO | Source: Ambulatory Visit | Attending: Cardiovascular Disease | Admitting: Cardiovascular Disease

## 2015-02-03 ENCOUNTER — Encounter (HOSPITAL_COMMUNITY)
Admission: RE | Disposition: A | Discharge status: Home / Self Care | Payer: Commercial Managed Care - HMO | Source: Ambulatory Visit | Attending: Cardiovascular Disease

## 2015-02-03 DIAGNOSIS — I200 Unstable angina: Secondary | ICD-10-CM | POA: Insufficient documentation

## 2015-02-03 DIAGNOSIS — N400 Enlarged prostate without lower urinary tract symptoms: Secondary | ICD-10-CM | POA: Insufficient documentation

## 2015-02-03 DIAGNOSIS — I4891 Unspecified atrial fibrillation: Secondary | ICD-10-CM | POA: Insufficient documentation

## 2015-02-03 DIAGNOSIS — E782 Mixed hyperlipidemia: Secondary | ICD-10-CM | POA: Diagnosis not present

## 2015-02-03 DIAGNOSIS — Z8673 Personal history of transient ischemic attack (TIA), and cerebral infarction without residual deficits: Secondary | ICD-10-CM | POA: Diagnosis not present

## 2015-02-03 DIAGNOSIS — I209 Angina pectoris, unspecified: Secondary | ICD-10-CM

## 2015-02-03 DIAGNOSIS — Z9861 Coronary angioplasty status: Secondary | ICD-10-CM | POA: Diagnosis not present

## 2015-02-03 DIAGNOSIS — I2510 Atherosclerotic heart disease of native coronary artery without angina pectoris: Secondary | ICD-10-CM | POA: Diagnosis not present

## 2015-02-03 HISTORY — PX: CARDIAC CATHETERIZATION: SHX172

## 2015-02-03 LAB — CBC
HCT: 46.4 % (ref 39.0–52.0)
HEMOGLOBIN: 15.8 g/dL (ref 13.0–17.0)
MCH: 33 pg (ref 26.0–34.0)
MCHC: 34.1 g/dL (ref 30.0–36.0)
MCV: 96.9 fL (ref 78.0–100.0)
PLATELETS: 161 10*3/uL (ref 150–400)
RBC: 4.79 MIL/uL (ref 4.22–5.81)
RDW: 12.5 % (ref 11.5–15.5)
WBC: 5.2 10*3/uL (ref 4.0–10.5)

## 2015-02-03 LAB — BASIC METABOLIC PANEL
Anion gap: 5 (ref 5–15)
BUN: 13 mg/dL (ref 6–20)
CHLORIDE: 108 mmol/L (ref 101–111)
CO2: 29 mmol/L (ref 22–32)
CREATININE: 1.1 mg/dL (ref 0.61–1.24)
Calcium: 8.7 mg/dL — ABNORMAL LOW (ref 8.9–10.3)
GFR calc non Af Amer: >60 mL/min (ref >60)
Glucose, Bld: 93 mg/dL (ref 65–99)
Potassium: 4 mmol/L (ref 3.5–5.1)
SODIUM: 142 mmol/L (ref 135–145)

## 2015-02-03 LAB — PROTIME-INR
INR: 1 (ref 0.00–1.49)
PROTHROMBIN TIME: 13.4 s (ref 11.6–15.2)

## 2015-02-03 SURGERY — LEFT HEART CATH AND CORS/GRAFTS ANGIOGRAPHY

## 2015-02-03 MED ORDER — SODIUM CHLORIDE 0.9 % IV SOLN: 250.0000 mL | INTRAVENOUS | Status: DC | PRN

## 2015-02-03 MED ORDER — NITROGLYCERIN 1 MG/10 ML FOR IR/CATH LAB
INTRA_ARTERIAL | Status: AC
  Filled 2015-02-03: qty 10

## 2015-02-03 MED ORDER — SODIUM CHLORIDE 0.9 % IV SOLN
INTRAVENOUS | Status: DC
  Administered 2015-02-03: 10:00:00 via INTRAVENOUS

## 2015-02-03 MED ORDER — SODIUM CHLORIDE 0.9 % IV SOLN: INTRAVENOUS | Status: DC

## 2015-02-03 MED ORDER — IOHEXOL 350 MG/ML SOLN
INTRAVENOUS | Status: DC | PRN
  Administered 2015-02-03: 90 mL via INTRA_ARTERIAL

## 2015-02-03 MED ORDER — VERAPAMIL HCL 2.5 MG/ML IV SOLN
INTRAVENOUS | Status: DC | PRN
  Administered 2015-02-03: 13:00:00 via INTRA_ARTERIAL

## 2015-02-03 MED ORDER — FENTANYL CITRATE (PF) 100 MCG/2ML IJ SOLN
INTRAMUSCULAR | Status: DC | PRN
  Administered 2015-02-03: 25 ug via INTRAVENOUS

## 2015-02-03 MED ORDER — SODIUM CHLORIDE 0.9 % IJ SOLN: 3.0000 mL | Freq: Two times a day (BID) | INTRAMUSCULAR | Status: DC

## 2015-02-03 MED ORDER — SODIUM CHLORIDE 0.9 % IJ SOLN: 3.0000 mL | INTRAMUSCULAR | Status: DC | PRN

## 2015-02-03 MED ORDER — HEPARIN (PORCINE) IN NACL 2-0.9 UNIT/ML-% IJ SOLN
INTRAMUSCULAR | Status: AC
  Filled 2015-02-03: qty 1000

## 2015-02-03 MED ORDER — ACETAMINOPHEN 325 MG PO TABS
650.0000 mg | ORAL_TABLET | ORAL | Status: DC | PRN
Start: 2015-02-03 — End: 2015-02-03

## 2015-02-03 MED ORDER — LIDOCAINE HCL (PF) 1 % IJ SOLN
INTRAMUSCULAR | Status: AC
  Filled 2015-02-03: qty 30

## 2015-02-03 MED ORDER — MIDAZOLAM HCL 2 MG/2ML IJ SOLN
INTRAMUSCULAR | Status: DC | PRN
  Administered 2015-02-03: 2 mg via INTRAVENOUS

## 2015-02-03 MED ORDER — MIDAZOLAM HCL 2 MG/2ML IJ SOLN
INTRAMUSCULAR | Status: AC
  Filled 2015-02-03: qty 2

## 2015-02-03 MED ORDER — HEPARIN SODIUM (PORCINE) 1000 UNIT/ML IJ SOLN
INTRAMUSCULAR | Status: DC | PRN
  Administered 2015-02-03: 5000 [IU] via INTRAVENOUS

## 2015-02-03 MED ORDER — FENTANYL CITRATE (PF) 100 MCG/2ML IJ SOLN
INTRAMUSCULAR | Status: AC
  Filled 2015-02-03: qty 2

## 2015-02-03 MED ORDER — METOPROLOL TARTRATE 1 MG/ML IV SOLN
INTRAVENOUS | Status: DC | PRN
  Administered 2015-02-03: 5 mg via INTRAVENOUS

## 2015-02-03 MED ORDER — VERAPAMIL HCL 2.5 MG/ML IV SOLN
INTRAVENOUS | Status: AC
  Filled 2015-02-03: qty 2

## 2015-02-03 MED ORDER — HEPARIN SODIUM (PORCINE) 1000 UNIT/ML IJ SOLN
INTRAMUSCULAR | Status: AC
  Filled 2015-02-03: qty 1

## 2015-02-03 MED ORDER — METOPROLOL TARTRATE 1 MG/ML IV SOLN
INTRAVENOUS | Status: AC
  Filled 2015-02-03: qty 5

## 2015-02-03 MED ORDER — ONDANSETRON HCL 4 MG/2ML IJ SOLN: 4.0000 mg | Freq: Four times a day (QID) | INTRAMUSCULAR | Status: DC | PRN

## 2015-02-03 MED ORDER — ASPIRIN 81 MG PO CHEW: 81.0000 mg | CHEWABLE_TABLET | ORAL | Status: DC

## 2015-02-03 SURGICAL SUPPLY — 15 items
CATH INFINITI 5 FR JL3.5 (CATHETERS) ×3 IMPLANT
CATH INFINITI 5FR ANG PIGTAIL (CATHETERS) ×3 IMPLANT
CATH INFINITI 5FR JL4 (CATHETERS) ×3 IMPLANT
CATH INFINITI 5FR MULTPACK ANG (CATHETERS) IMPLANT
CATH INFINITI JR4 5F (CATHETERS) ×3 IMPLANT
DEVICE RAD COMP TR BAND LRG (VASCULAR PRODUCTS) ×3 IMPLANT
GLIDESHEATH SLEND SS 6F .021 (SHEATH) ×3 IMPLANT
KIT HEART LEFT (KITS) ×3 IMPLANT
PACK CARDIAC CATHETERIZATION (CUSTOM PROCEDURE TRAY) ×3 IMPLANT
SHEATH PINNACLE 5F 10CM (SHEATH) IMPLANT
SYR MEDRAD MARK V 150ML (SYRINGE) ×3 IMPLANT
TRANSDUCER W/STOPCOCK (MISCELLANEOUS) ×3 IMPLANT
TUBING CIL FLEX 10 FLL-RA (TUBING) ×3 IMPLANT
WIRE EMERALD 3MM-J .035X150CM (WIRE) IMPLANT
WIRE SAFE-T 1.5MM-J .035X260CM (WIRE) ×3 IMPLANT

## 2015-02-03 NOTE — Progress Notes (Signed)
Called to see pt re: tingling in thumb and edema R hand with TR band in place.  2 cc had been let out, symptoms have improved. Capillary refill is 3 seconds. 1 additional cc let out, no bleeding at cath site. Continue with current plan of care.  Rosaria Ferries, Hershal Coria 02/03/2015 2:24 PM Beeper [phone]

## 2015-02-03 NOTE — Progress Notes (Signed)
Client with swelling noted proximal to right wrist tr band and pressure held and Har[place],RN in and placed ace wrap on right arm proximal to tr band

## 2015-02-03 NOTE — Discharge Instructions (Signed)
Radial Site Care °Refer to this sheet in the next few weeks. These instructions provide you with information on caring for yourself after your procedure. Your caregiver may also give you more specific instructions. Your treatment has been planned according to current medical practices, but problems sometimes occur. Call your caregiver if you have any problems or questions after your procedure. °HOME CARE INSTRUCTIONS °· You may shower the day after the procedure. Remove the bandage (dressing) and gently wash the site with plain soap and water. Gently pat the site dry. °· Do not apply powder or lotion to the site. °· Do not submerge the affected site in water for 3 to 5 days. °· Inspect the site at least twice daily. °· Do not flex or bend the affected arm for 24 hours. °· No lifting over 5 pounds (2.3 kg) for 5 days after your procedure. °· Do not drive home if you are discharged the same day of the procedure. Have someone else drive you. °· You may drive 24 hours after the procedure unless otherwise instructed by your caregiver. °· Do not operate machinery or power tools for 24 hours. °· A responsible adult should be with you for the first 24 hours after you arrive home. °What to expect: °· Any bruising will usually fade within 1 to 2 weeks. °· Blood that collects in the tissue (hematoma) may be painful to the touch. It should usually decrease in size and tenderness within 1 to 2 weeks. °SEEK IMMEDIATE MEDICAL CARE IF: °· You have unusual pain at the radial site. °· You have redness, warmth, swelling, or pain at the radial site. °· You have drainage (other than a small amount of blood on the dressing). °· You have chills. °· You have a fever or persistent symptoms for more than 72 hours. °· You have a fever and your symptoms suddenly get worse. °· Your arm becomes pale, cool, tingly, or numb. °· You have heavy bleeding from the site. Hold pressure on the site. °Document Released: 09/07/2010 Document Revised:  10/28/2011 Document Reviewed: 09/07/2010 °[employer]® Patient Information ©2015 [employer]. This information is not intended to replace advice given to you by your health care provider. Make sure you discuss any questions you have with your health care provider. ° °

## 2015-02-03 NOTE — H&P (View-Only) (Signed)
Cardiology Office Note  Date: 01/30/2015   ID: Hector Torres, DOB [birth year], MRN [id number]  PCP: Redge Gainer, MD  Primary Cardiologist: Rozann Lesches, MD   Chief Complaint  Patient presents with  . Coronary Artery Disease  . Hyperlipidemia    History of Present Illness: Hector Torres is a 74 y.o. male last seen in June 2015. He presents for a routine follow-up visit. Since I last saw him, he reports having increasing episodes of angina. Most recent ischemic testing from last year is outlined below. He had an area of mild anteroseptal ischemia in the mid to apical level with associated scar, LVEF 51%. At that point he was managed medically reporting adequate symptom control.  Follow-up ECG today shows sinus rhythm with anteroseptal Q waves and inferolateral ST-T wave abnormalities. These are fairly similar to his tracing from last year.  He continues to follow with Dr. Bridgett Larsson for carotid artery disease, recent carotid Dopplers showed less than 40% RICA stenosis and 90-24% LICA stenosis.  Today we discussed his cardiac medications, also possibility of proceeding to a diagnostic heart catheterization to more clearly evaluate his coronary anatomy for potential revascularization options. In light of his progressing symptoms, he is in agreement to proceed.   Past Medical History  Diagnosis Date  . Carotid artery disease     Bilateral - Dr. Bridgett Larsson  . PVD (peripheral vascular disease)   . TIA (transient ischemic attack)   . Coronary atherosclerosis of native coronary artery     PTCA circumflex at [facility]  . Essential hypertension, benign     Possible white coat  . Mixed hyperlipidemia   . Prostate nodule   . Vitamin D deficiency   . Stroke   . Colon polyp   . Kidney stone   . BPH (benign prostatic hypertrophy)     Past Surgical History  Procedure Laterality Date  . Eye surgery      Bilateral cataract  . Appendectomy  1947  . Tonsillectomy  1968  . Traumatic injury   1962    Lost tip of index, middle, and ring finger/ right hand  . Angioplasty Left 1995    Cir. Artery    Current Outpatient Prescriptions  Medication Sig Dispense Refill  . acetaminophen (TYLENOL) 325 MG tablet Take 650 mg by mouth every 6 (six) hours as needed.      Marland Kitchen aspirin 81 MG tablet Take 81 mg by mouth daily.    . carvedilol (COREG) 3.125 MG tablet TAKE 1 TABLET BY MOUTH TWICE A DAY 60 tablet 11  . Cholecalciferol (VITAMIN D) 1000 UNITS capsule Take 5,000 Units by mouth 2 (two) times daily. Take 3 capsules daily    . finasteride (PROSCAR) 5 MG tablet Take 1 tablet by mouth daily.    . hydrochlorothiazide (HYDRODIURIL) 25 MG tablet Take 1 tablet (25 mg total) by mouth daily. 30 tablet 5  . losartan (COZAAR) 25 MG tablet TAKE 1 TABLET BY MOUTH DAILY 90 tablet 3  . Multiple Vitamin (MULTIVITAMIN PO) Take 1 tablet by mouth daily.      . nitroGLYCERIN (NITROSTAT) 0.4 MG SL tablet Place 1 tablet (0.4 mg total) under the tongue every 5 (five) minutes as needed for chest pain. (Patient taking differently: Place 0.4 mg under the tongue as needed for chest pain. ) 25 tablet 3  . ranitidine (ZANTAC) 150 MG capsule Take 150 mg by mouth 2 (two) times daily.    . rosuvastatin (CRESTOR) 20 MG tablet TAKE 1 TABLET  BY MOUTH EVERY DAY 30 tablet 11  . tamsulosin (FLOMAX) 0.4 MG CAPS capsule Take 0.4 mg by mouth daily after supper.    . vitamin E 400 UNIT capsule Take 400 Units by mouth daily.       No current facility-administered medications for this visit.    Allergies:  Review of patient's allergies indicates no known allergies.   Social History: The patient  reports that he quit smoking about 3 years ago. His smoking use included Cigarettes. He has a 90 pack-year smoking history. He has never used smokeless tobacco. He reports that he does not drink alcohol or use illicit drugs.   ROS:  Please see the history of present illness. Otherwise, complete review of systems is positive for none.  All  other systems are reviewed and negative.   Physical Exam: VS:  BP 128/80 mmHg  Pulse 75  Ht 5\' 10"  (1.778 m)  Wt 209 lb (94.802 kg)  BMI 29.99 kg/m2  SpO2 98%, BMI Body mass index is 29.99 kg/(m^2).  Wt Readings from Last 3 Encounters:  01/30/15 209 lb (94.802 kg)  01/26/15 209 lb (94.802 kg)  10/14/14 220 lb (99.791 kg)     General: Patient appears comfortable at rest. HEENT: Conjunctiva and lids normal, oropharynx clear with moist mucosa. Neck: Supple, no elevated JVP or carotid bruits, no thyromegaly. Lungs: Clear to auscultation, nonlabored breathing at rest. Cardiac: Regular rate and rhythm, no S3 or significant systolic murmur, no pericardial rub. Abdomen: Soft, nontender, no hepatomegaly, bowel sounds present, no guarding or rebound. Extremities: No pitting edema, distal pulses 2+. Skin: Warm and dry. Musculoskeletal: No kyphosis. Neuropsychiatric: Alert and oriented x3, affect grossly appropriate.   ECG: ECG is ordered today.   Recent Labwork: 07/21/2014: ALT 15; AST 24; BUN 14; Creatinine, Ser 1.29*; Potassium 4.2; Sodium 144     Component Value Date/Time   CHOL 112 07/21/2014 1044   CHOL 102 12/25/2012 1251   TRIG 94 07/21/2014 1044   TRIG 82 12/25/2012 1251   HDL 38* 07/21/2014 1044   HDL 36* 12/25/2012 1251   CHOLHDL 2.9 07/21/2014 1044   LDLCALC 55 07/21/2014 1044   LDLCALC 50 12/25/2012 1251    Other Studies Reviewed Today:  Lexiscan Cardiolite 11/04/2013: FINDINGS: Baseline tracing shows sinus rhythm with inferolateral ST segment depression and nonspecific T-wave inversion, poor R wave progression anteriorly. Lexiscan bolus was given in standard fashion. Heart rate increased from 63 beats per min up to 80 beats per min, and blood pressure increased from 150/91 up to 162/85. No chest pain was reported. There was accentuation in resting ST segment abnormalities, however not of diagnostic degree over baseline. Occasional PVCs noted.  Analysis of  the raw perfusion data finds adequate radiotracer uptake.  Tomographic views were obtained using the short axis, vertical long axis, and horizontal long axis planes. There is a small, mild intensity, anteroseptal defect noted that is partially reversible in the mid to apical segment. Suggest mild ischemia and potentially even some component of scar.  Gated imaging reveals an EDV of 114, ESV of 56, TID ratio of 1.11, and LVEF of 51% with possible inferoseptal hypokinesis.  IMPRESSION: Abnormal, but overall low risk Lexiscan Cardiolite in the setting of known ischemic heart disease. There are baseline ST segment abnormalities without diagnostic accentuation with Lexiscan. Rare PVCs noted. Perfusion imaging suggests possible mild anteroseptal ischemia at the mid to apical level with some component of scar. There are no large ischemic territories. LVEF is calculated at 51% with  possible inferoseptal hypokinesis and normal volumes.   Assessment and Plan:  1. CAD status post PTCA to the circumflex at [facility] in 1995. He is reporting accelerating angina symptoms on medical therapy, ischemic testing from last year is outlined above indicating region of mild anteroseptal ischemia in the mid to apical zone with some degree of scar. He almost certainly has had progression in CAD over the years, and in light of his progressing symptoms we have discussed proceeding to a diagnostic cardiac catheterization. He is in agreement to proceed.  2. Carotid artery disease as outlined above. He continues on aspirin and statin therapy.  3. Hyperlipidemia, on Crestor. LDL 55 in December 2015.  4. History of hypertension, blood pressure control is good today.  Current medicines were reviewed with the patient today.   Orders Placed This Encounter  Procedures  . EKG 12-Lead    Disposition: FU with me after cardiac catheterization.   Signed, Satira Sark, MD, [facility] 01/30/2015 10:47 AM    [facility] [place], [place], [place] [address] Phone: [phone]; Fax: [phone]

## 2015-02-03 NOTE — Interval H&P Note (Signed)
History and Physical Interval Note:  02/03/2015 11:24 AM  Hector Torres  has presented today for surgery, with the diagnosis of cad  The various methods of treatment have been discussed with the patient and family. After consideration of risks, benefits and other options for treatment, the patient has consented to  Procedure(s): Left Heart Cath and Cors/Grafts Angiography (N/A) as a surgical intervention .  The patient's history has been reviewed, patient examined, no change in status, stable for surgery.  I have reviewed the patient's chart and labs.  Questions were answered to the patient's satisfaction.    Cath Lab Visit (complete for each Cath Lab visit)  Clinical Evaluation Leading to the Procedure:   ACS: No.  Non-ACS:    Anginal Classification: CCS III  Anti-ischemic medical therapy: Minimal Therapy (1 class of medications)  Non-Invasive Test Results: No non-invasive testing performed  Prior CABG: No previous CABG       Sherren Mocha

## 2015-02-06 ENCOUNTER — Encounter (HOSPITAL_COMMUNITY): Payer: Self-pay | Admitting: Cardiovascular Disease

## 2015-02-06 MED FILL — Nitroglycerin IV Soln 100 MCG/ML in D5W: INTRA_ARTERIAL | Qty: 10 | Status: AC

## 2015-02-06 MED FILL — Lidocaine HCl Local Preservative Free (PF) Inj 1%: INTRAMUSCULAR | Qty: 30 | Status: AC

## 2015-02-06 MED FILL — Heparin Sodium (Porcine) 2 Unit/ML in Sodium Chloride 0.9%: INTRAMUSCULAR | Qty: 1000 | Status: AC

## 2015-02-07 NOTE — Telephone Encounter (Signed)
Approved see chart for auth#

## 2015-02-14 DIAGNOSIS — H3531 Nonexudative age-related macular degeneration: Secondary | ICD-10-CM | POA: Diagnosis not present

## 2015-02-14 DIAGNOSIS — H35371 Puckering of macula, right eye: Secondary | ICD-10-CM | POA: Diagnosis not present

## 2015-02-14 DIAGNOSIS — H359 Unspecified retinal disorder: Secondary | ICD-10-CM | POA: Diagnosis not present

## 2015-02-14 DIAGNOSIS — H35033 Hypertensive retinopathy, bilateral: Secondary | ICD-10-CM | POA: Diagnosis not present

## 2015-03-13 DIAGNOSIS — R69 Illness, unspecified: Secondary | ICD-10-CM | POA: Diagnosis not present

## 2015-03-27 ENCOUNTER — Telehealth: Payer: Self-pay | Admitting: Family Medicine

## 2015-04-13 ENCOUNTER — Encounter: Payer: Self-pay | Admitting: Family

## 2015-04-14 ENCOUNTER — Ambulatory Visit (HOSPITAL_COMMUNITY)
Admission: RE | Admit: 2015-04-14 | Discharge: 2015-04-14 | Disposition: A | Discharge status: Home / Self Care | Payer: Commercial Managed Care - HMO | Source: Ambulatory Visit | Attending: Family | Admitting: Family

## 2015-04-14 ENCOUNTER — Ambulatory Visit (INDEPENDENT_AMBULATORY_CARE_PROVIDER_SITE_OTHER): Payer: Commercial Managed Care - HMO | Admitting: Family

## 2015-04-14 ENCOUNTER — Other Ambulatory Visit: Payer: Self-pay | Admitting: Family

## 2015-04-14 ENCOUNTER — Encounter: Payer: Self-pay | Admitting: Family

## 2015-04-14 DIAGNOSIS — Z87891 Personal history of nicotine dependence: Secondary | ICD-10-CM

## 2015-04-14 DIAGNOSIS — I6523 Occlusion and stenosis of bilateral carotid arteries: Secondary | ICD-10-CM

## 2015-04-14 DIAGNOSIS — R42 Dizziness and giddiness: Secondary | ICD-10-CM | POA: Insufficient documentation

## 2015-04-14 DIAGNOSIS — H53139 Sudden visual loss, unspecified eye: Secondary | ICD-10-CM | POA: Insufficient documentation

## 2015-04-14 NOTE — Progress Notes (Signed)
Filed Vitals:   04/14/15 1141 04/14/15 1145 04/14/15 1153  BP: 152/86 125/76 137/72  Pulse: 62 60 58  Temp:  97.8 F (36.6 C)   TempSrc:  Oral   Resp:  16   Height:  5\' 10"  (1.778 m)   Weight:  211 lb (95.709 kg)   SpO2:  94%

## 2015-04-14 NOTE — Patient Instructions (Signed)
Stroke Prevention Some medical conditions and behaviors are associated with an increased chance of having a stroke. You may prevent a stroke by making healthy choices and managing medical conditions. HOW CAN I REDUCE MY RISK OF HAVING A STROKE?   Stay physically active. Get at least 30 minutes of activity on most or all days.  Do not smoke. It may also be helpful to avoid exposure to secondhand smoke.  Limit alcohol use. Moderate alcohol use is considered to be:  No more than 2 drinks per day for men.  No more than 1 drink per day for nonpregnant women.  Eat healthy foods. This involves:  Eating 5 or more servings of fruits and vegetables a day.  Making dietary changes that address high blood pressure (hypertension), high cholesterol, diabetes, or obesity.  Manage your cholesterol levels.  Making food choices that are high in fiber and low in saturated fat, trans fat, and cholesterol may control cholesterol levels.  Take any prescribed medicines to control cholesterol as directed by your health care provider.  Manage your diabetes.  Controlling your carbohydrate and sugar intake is recommended to manage diabetes.  Take any prescribed medicines to control diabetes as directed by your health care provider.  Control your hypertension.  Making food choices that are low in salt (sodium), saturated fat, trans fat, and cholesterol is recommended to manage hypertension.  Take any prescribed medicines to control hypertension as directed by your health care provider.  Maintain a healthy weight.  Reducing calorie intake and making food choices that are low in sodium, saturated fat, trans fat, and cholesterol are recommended to manage weight.  Stop drug abuse.  Avoid taking birth control pills.  Talk to your health care provider about the risks of taking birth control pills if you are over 35 years old, smoke, get migraines, or have ever had a blood clot.  Get evaluated for sleep  disorders (sleep apnea).  Talk to your health care provider about getting a sleep evaluation if you snore a lot or have excessive sleepiness.  Take medicines only as directed by your health care provider.  For some people, aspirin or blood thinners (anticoagulants) are helpful in reducing the risk of forming abnormal blood clots that can lead to stroke. If you have the irregular heart rhythm of atrial fibrillation, you should be on a blood thinner unless there is a good reason you cannot take them.  Understand all your medicine instructions.  Make sure that other conditions (such as anemia or atherosclerosis) are addressed. SEEK IMMEDIATE MEDICAL CARE IF:   You have sudden weakness or numbness of the face, arm, or leg, especially on one side of the body.  Your face or eyelid droops to one side.  You have sudden confusion.  You have trouble speaking (aphasia) or understanding.  You have sudden trouble seeing in one or both eyes.  You have sudden trouble walking.  You have dizziness.  You have a loss of balance or coordination.  You have a sudden, severe headache with no known cause.  You have new chest pain or an irregular heartbeat. Any of these symptoms may represent a serious problem that is an emergency. Do not wait to see if the symptoms will go away. Get medical help at once. Call your local emergency services (911 in [place] Do not drive yourself to the hospital. Document Released: 09/12/2004 Document Revised: 12/20/2013 Document Reviewed: 02/05/2013 Exit[employer] Patient Information 2015 [employer]. This information is not intended to replace advice given   to you by your health care provider. Make sure you discuss any questions you have with your health care provider.  

## 2015-04-14 NOTE — Progress Notes (Signed)
Established Carotid Patient   History of Present Illness  Hector Torres is a 74 y.o. male patient of Dr. Bridgett Larsson who presents with chief complaint: routine follow up for carotid artery stenosis. Previous carotid studies demonstrated: RICA 29-51% stenosis, LICA 88-41% stenosis. Patient has distant history of TIA symptom (about 2006): years ago notable for L hand weakness. The patient has never had amaurosis fugax or monocular blindness. The patient has had transient right upper extremity plegia. The patient has had transient expressive aphasia. The patient's previous neurologic deficits have resolved. This patient has also had prior studies concerning for L subclavian artery steal. The patient denies any left arm complaints. He has never had claudication equivalent sx. Yesterday he had an episode of blurred vision and feeling light headed for about 10-15 seconds; states this happened in hot weather while playing golf. This has also happened about 5 other times this Summer, all while playing golf in the heat. He states he had an episode of low blood pressure before, states this felt like that.  Pt denies tingling, numbness, pain, or weakness in either hand/arm, he denies monocular vision change, denies speech difficulties.  He has a history of coronary angioplasty. He had a cardiac cath June 2016 which pt states was negative. His cardiologist is Dr. Domenic Polite.   Pt states his blood pressure normally is about 120/70, his blood pressure gets high in a medical office. The patient reports chest pain and pressure, states his PCP is aware, had a GI work up. Also had cardiac work up that was negative. Zantac has mostly relieved the chest pressure. He gets pain in his hips with walking, has occasional back pain, denies non healing wounds.   Pt Diabetic: No Pt smoker: former smoker, quit in 2014  Pt meds include: Statin : Yes ASA: Yes Other anticoagulants/antiplatelets: no   Past Medical History   Diagnosis Date  . Carotid artery disease     Bilateral - Dr. Bridgett Larsson  . PVD (peripheral vascular disease)   . TIA (transient ischemic attack)   . Coronary atherosclerosis of native coronary artery     PTCA circumflex at [facility]  . Essential hypertension, benign     Possible white coat  . Mixed hyperlipidemia   . Prostate nodule   . Vitamin D deficiency   . Colon polyp   . Kidney stone   . BPH (benign prostatic hypertrophy)   . COPD (chronic obstructive pulmonary disease)   . Stroke 2010    Mini stroke    Social History Social History  Substance Use Topics  . Smoking status: Former Smoker -- 1.50 packs/day for 60 years    Types: Cigarettes    Quit date: 02/27/2011  . Smokeless tobacco: Never Used  . Alcohol Use: No    Family History Family History  Problem Relation Age of Onset  . Gastric cancer Mother   . Varicose Veins Mother   . Cancer Mother     Stomach  . Prostate cancer Father   . Cancer Father     Prostate  . Other Brother     carotid artery stenosis  . Diabetes Brother   . Leukemia Brother   . Hyperlipidemia Brother   . Hypertension Brother   . Colon cancer Brother   . Hyperlipidemia Brother   . Peripheral vascular disease Brother   . Cancer Brother     Colon and Prostate  . Hypertension Sister   . Hyperlipidemia Sister   . Diabetes Daughter   . Diabetes  Son   . Prostate cancer Brother     Surgical History Past Surgical History  Procedure Laterality Date  . Eye surgery      Bilateral cataract  . Appendectomy  1947  . Tonsillectomy  1968  . Traumatic injury  1962    Lost tip of index, middle, and ring finger/ right hand  . Angioplasty Left 1995    Cir. Artery  . Cardiac catheterization N/A 02/03/2015    Procedure: Left Heart Cath and Cors/Grafts Angiography;  Surgeon: Sherren Mocha, MD;  Location: [facility];  Service: Cardiovascular;  Laterality: N/A;    No Known Allergies  Current Outpatient Prescriptions  Medication Sig  Dispense Refill  . acetaminophen (TYLENOL) 325 MG tablet Take 325 mg by mouth every 6 (six) hours as needed for mild pain or moderate pain.     Marland Kitchen aspirin 81 MG tablet Take 81 mg by mouth daily.    . carvedilol (COREG) 3.125 MG tablet TAKE 1 TABLET BY MOUTH TWICE A DAY 60 tablet 11  . Cholecalciferol (VITAMIN D) 1000 UNITS capsule Take 5,000 Units by mouth 2 (two) times daily.     . finasteride (PROSCAR) 5 MG tablet Take 1 tablet by mouth daily.    . hydrochlorothiazide (HYDRODIURIL) 25 MG tablet Take 1 tablet (25 mg total) by mouth daily. (Patient taking differently: Take 25 mg by mouth daily as needed (swelling, fluid). ) 30 tablet 5  . losartan (COZAAR) 25 MG tablet TAKE 1 TABLET BY MOUTH DAILY 90 tablet 3  . Multiple Vitamin (MULTIVITAMIN PO) Take 1 tablet by mouth daily.      . nitroGLYCERIN (NITROSTAT) 0.4 MG SL tablet Place 1 tablet (0.4 mg total) under the tongue every 5 (five) minutes as needed for chest pain. (Patient taking differently: Place 0.4 mg under the tongue as needed for chest pain. ) 25 tablet 3  . rosuvastatin (CRESTOR) 20 MG tablet TAKE 1 TABLET BY MOUTH EVERY DAY (Patient taking differently: Take 20 mg by mouth at bedtime. ) 30 tablet 11  . tamsulosin (FLOMAX) 0.4 MG CAPS capsule Take 0.4 mg by mouth daily after supper.    . vitamin E 400 UNIT capsule Take 400 Units by mouth daily.      . ranitidine (ZANTAC) 150 MG capsule Take 150 mg by mouth daily as needed for heartburn.      No current facility-administered medications for this visit.    Review of Systems : See HPI for pertinent positives and negatives.  Physical Examination  Filed Vitals:   04/14/15 1141 04/14/15 1145 04/14/15 1153  BP: 152/86 125/76 137/72  Pulse: 62 60 58  Temp:  97.8 F (36.6 C)   TempSrc:  Oral   Resp:  16   Height:  5\' 10"  (1.778 m)   Weight:  211 lb (95.709 kg)   SpO2:  94%    Body mass index is 30.28 kg/(m^2).  General: A&O x 3, WD, Obese male   Eyes: PERRLA  Pulmonary: Sym  exp, good air movt, CTAB, no rales, rhonchi, & wheezing  Cardiac: RRR, Nl S1, S2, + murmur  Vascular: Vessel Right Left  Radial 2+Palpable 2+Palpable  Carotid Transmitted cardiac murmur Transmitted cardiac murmur  Aorta Not palpable N/A  Popliteal Not palpable Not palpable  PT Palpable Palpable  DP Palpable Palpable   Gastrointestinal: soft, NTND, -G/R, - HSM, - palpable masses, - CVAT B  Musculoskeletal: M/S 5/5 throughout , Extremities without ischemic changes   Neurologic: CN 2-12 intact ,  Pain and light touch intact in extremities , Motor exam as listed above               Non-Invasive Vascular Imaging CAROTID DUPLEX 04/14/2015   CEREBROVASCULAR DUPLEX EVALUATION    INDICATION: Carotid stenosis    PREVIOUS INTERVENTION(S): NA    DUPLEX EXAM:     RIGHT  LEFT  Peak Systolic Velocities (cm/s) End Diastolic Velocities (cm/s) Plaque LOCATION Peak Systolic Velocities (cm/s) End Diastolic Velocities (cm/s) Plaque  68 11  CCA PROXIMAL 72 19   54 10  CCA MID 80 16   51 10 HT CCA DISTAL 71 18 HT  128 11 HT ECA 69 11 HT  132 38 HT ICA PROXIMAL 244 69 HT  127 29  ICA MID 108 25   136 40 tortuous ICA DISTAL 93 29     2.44 ICA / CCA Ratio (PSV) 3.05  Antegrade Vertebral Flow To-Fro  557 Brachial Systolic Pressure (mmHg) 322  Triphasic Brachial Artery Waveforms Triphasic    Plaque Morphology:  HM = Homogeneous, HT = Heterogeneous, CP = Calcific Plaque, SP = Smooth Plaque, IP = Irregular Plaque     ADDITIONAL FINDINGS: Right subclavian artery PSV109cm/sec; Left subclavian artery PSV87cm/sec    IMPRESSION: Right internal carotid artery stenosis present in the less than 40% range. The right distal internal carotid artery is tortuous and velocity is most likely overestimated. Left internal carotid artery stenosis present in the 60%-79% range. Blood pressure gradient present with bidirectional left vertebral artery which may be  suggestive of subclavian steal syndrome.    Compared to the previous exam:  Essentially unchanged since previous study on 10/14/2014.      Assessment: Hector Torres is a 74 y.o. male who had a TIA in 2006, none subsequently. Today's carotid Duplex suggests minimal right ICA stenosis and 60-79% left ICA stenosis. Discussed with Dr. Bridgett Larsson pt six episodes of heat related light headedness and blurred vision as not likely to be TIA's as he had no lateralizing symptoms.   Plan: Follow-up in 6 months with Carotid Duplex.    I discussed in depth with the patient the nature of atherosclerosis, and emphasized the importance of maximal medical management including strict control of blood pressure, blood glucose, and lipid levels, obtaining regular exercise, and continued cessation of smoking.  The patient is aware that without maximal medical management the underlying atherosclerotic disease process will progress, limiting the benefit of any interventions. The patient was given information about stroke prevention and what symptoms should prompt the patient to seek immediate medical care. Thank you for allowing [place] to participate in this patient's care.  Clemon Chambers, RN, MSN, FNP-C [facility] Office: [phone]  Clinic Physician: Bridgett Larsson   04/14/2015 12:01 PM

## 2015-05-18 ENCOUNTER — Emergency Department (HOSPITAL_COMMUNITY): Payer: Commercial Managed Care - HMO

## 2015-05-18 ENCOUNTER — Inpatient Hospital Stay (HOSPITAL_COMMUNITY)
Admission: EM | Admit: 2015-05-18 | Discharge: 2015-05-20 | DRG: 281 | Disposition: A | Discharge status: Home / Self Care | Payer: Commercial Managed Care - HMO | Attending: Cardiology | Admitting: Cardiology

## 2015-05-18 ENCOUNTER — Encounter (HOSPITAL_COMMUNITY): Payer: Self-pay | Admitting: Emergency Medicine

## 2015-05-18 DIAGNOSIS — R0789 Other chest pain: Secondary | ICD-10-CM

## 2015-05-18 DIAGNOSIS — Z7982 Long term (current) use of aspirin: Secondary | ICD-10-CM

## 2015-05-18 DIAGNOSIS — Z87891 Personal history of nicotine dependence: Secondary | ICD-10-CM | POA: Diagnosis not present

## 2015-05-18 DIAGNOSIS — Z8249 Family history of ischemic heart disease and other diseases of the circulatory system: Secondary | ICD-10-CM

## 2015-05-18 DIAGNOSIS — I739 Peripheral vascular disease, unspecified: Secondary | ICD-10-CM | POA: Diagnosis present

## 2015-05-18 DIAGNOSIS — Z9842 Cataract extraction status, left eye: Secondary | ICD-10-CM

## 2015-05-18 DIAGNOSIS — I2510 Atherosclerotic heart disease of native coronary artery without angina pectoris: Principal | ICD-10-CM | POA: Diagnosis present

## 2015-05-18 DIAGNOSIS — J449 Chronic obstructive pulmonary disease, unspecified: Secondary | ICD-10-CM | POA: Diagnosis present

## 2015-05-18 DIAGNOSIS — Z9861 Coronary angioplasty status: Secondary | ICD-10-CM | POA: Diagnosis not present

## 2015-05-18 DIAGNOSIS — I1310 Hypertensive heart and chronic kidney disease without heart failure, with stage 1 through stage 4 chronic kidney disease, or unspecified chronic kidney disease: Secondary | ICD-10-CM | POA: Diagnosis present

## 2015-05-18 DIAGNOSIS — E782 Mixed hyperlipidemia: Secondary | ICD-10-CM | POA: Diagnosis present

## 2015-05-18 DIAGNOSIS — I10 Essential (primary) hypertension: Secondary | ICD-10-CM | POA: Diagnosis not present

## 2015-05-18 DIAGNOSIS — Z79899 Other long term (current) drug therapy: Secondary | ICD-10-CM | POA: Diagnosis not present

## 2015-05-18 DIAGNOSIS — I471 Supraventricular tachycardia: Secondary | ICD-10-CM | POA: Diagnosis present

## 2015-05-18 DIAGNOSIS — I25119 Atherosclerotic heart disease of native coronary artery with unspecified angina pectoris: Secondary | ICD-10-CM | POA: Diagnosis not present

## 2015-05-18 DIAGNOSIS — N183 Chronic kidney disease, stage 3 (moderate): Secondary | ICD-10-CM | POA: Diagnosis present

## 2015-05-18 DIAGNOSIS — E785 Hyperlipidemia, unspecified: Secondary | ICD-10-CM

## 2015-05-18 DIAGNOSIS — Z23 Encounter for immunization: Secondary | ICD-10-CM

## 2015-05-18 DIAGNOSIS — E559 Vitamin D deficiency, unspecified: Secondary | ICD-10-CM | POA: Diagnosis present

## 2015-05-18 DIAGNOSIS — R079 Chest pain, unspecified: Secondary | ICD-10-CM | POA: Diagnosis present

## 2015-05-18 DIAGNOSIS — I209 Angina pectoris, unspecified: Secondary | ICD-10-CM | POA: Diagnosis not present

## 2015-05-18 DIAGNOSIS — Z8673 Personal history of transient ischemic attack (TIA), and cerebral infarction without residual deficits: Secondary | ICD-10-CM

## 2015-05-18 DIAGNOSIS — Z9841 Cataract extraction status, right eye: Secondary | ICD-10-CM | POA: Diagnosis not present

## 2015-05-18 DIAGNOSIS — N400 Enlarged prostate without lower urinary tract symptoms: Secondary | ICD-10-CM | POA: Diagnosis present

## 2015-05-18 DIAGNOSIS — I214 Non-ST elevation (NSTEMI) myocardial infarction: Secondary | ICD-10-CM | POA: Diagnosis present

## 2015-05-18 DIAGNOSIS — I1A0 Resistant hypertension: Secondary | ICD-10-CM | POA: Diagnosis present

## 2015-05-18 DIAGNOSIS — I252 Old myocardial infarction: Secondary | ICD-10-CM | POA: Insufficient documentation

## 2015-05-18 DIAGNOSIS — R000 Tachycardia, unspecified: Secondary | ICD-10-CM | POA: Diagnosis not present

## 2015-05-18 HISTORY — DX: Gastro-esophageal reflux disease without esophagitis: K21.9

## 2015-05-18 HISTORY — DX: Unspecified osteoarthritis, unspecified site: M19.90

## 2015-05-18 LAB — TROPONIN I
TROPONIN I: 0.39 ng/mL — AB (ref <0.031)
TROPONIN I: 0.17 ng/mL — AB (ref <0.031)
TROPONIN I: 0.57 ng/mL — AB (ref <0.031)

## 2015-05-18 LAB — I-STAT CHEM 8, ED
BUN: 25 mg/dL — ABNORMAL HIGH (ref 6–20)
CALCIUM ION: 1.15 mmol/L (ref 1.13–1.30)
CHLORIDE: 101 mmol/L (ref 101–111)
CREATININE: 1.3 mg/dL — AB (ref 0.61–1.24)
GLUCOSE: 116 mg/dL — AB (ref 65–99)
HCT: 53 % — ABNORMAL HIGH (ref 39.0–52.0)
Hemoglobin: 18 g/dL — ABNORMAL HIGH (ref 13.0–17.0)
POTASSIUM: 3.7 mmol/L (ref 3.5–5.1)
Sodium: 142 mmol/L (ref 135–145)
TCO2: 28 mmol/L (ref 0–100)

## 2015-05-18 LAB — CBC
HCT: 50.2 % (ref 39.0–52.0)
HEMOGLOBIN: 17.3 g/dL — AB (ref 13.0–17.0)
MCH: 33.8 pg (ref 26.0–34.0)
MCHC: 34.5 g/dL (ref 30.0–36.0)
MCV: 98 fL (ref 78.0–100.0)
PLATELETS: 172 10*3/uL (ref 150–400)
RBC: 5.12 MIL/uL (ref 4.22–5.81)
RDW: 12.4 % (ref 11.5–15.5)
WBC: 8 10*3/uL (ref 4.0–10.5)

## 2015-05-18 LAB — I-STAT TROPONIN, ED: Troponin i, poc: 0.02 ng/mL (ref 0.00–0.08)

## 2015-05-18 LAB — BASIC METABOLIC PANEL
Anion gap: 9 (ref 5–15)
BUN: 25 mg/dL — ABNORMAL HIGH (ref 6–20)
CHLORIDE: 102 mmol/L (ref 101–111)
CO2: 31 mmol/L (ref 22–32)
CREATININE: 1.28 mg/dL — AB (ref 0.61–1.24)
Calcium: 8.9 mg/dL (ref 8.9–10.3)
GFR calc non Af Amer: 53 mL/min — ABNORMAL LOW (ref >60)
GLUCOSE: 118 mg/dL — AB (ref 65–99)
Potassium: 3.7 mmol/L (ref 3.5–5.1)
Sodium: 142 mmol/L (ref 135–145)

## 2015-05-18 LAB — TSH: TSH: 1.269 u[IU]/mL (ref 0.350–4.500)

## 2015-05-18 LAB — D-DIMER, QUANTITATIVE: D-Dimer, Quant: 0.73 ug/mL-FEU — ABNORMAL HIGH (ref 0.00–0.48)

## 2015-05-18 LAB — MAGNESIUM: Magnesium: 1.8 mg/dL (ref 1.7–2.4)

## 2015-05-18 MED ORDER — PNEUMOCOCCAL VAC POLYVALENT 25 MCG/0.5ML IJ INJ
0.5000 mL | INJECTION | INTRAMUSCULAR | Status: AC
  Administered 2015-05-19: 0.5 mL via INTRAMUSCULAR
  Filled 2015-05-18: qty 0.5

## 2015-05-18 MED ORDER — INFLUENZA VAC SPLIT QUAD 0.5 ML IM SUSY
0.5000 mL | PREFILLED_SYRINGE | INTRAMUSCULAR | Status: AC
  Administered 2015-05-19: 0.5 mL via INTRAMUSCULAR
  Filled 2015-05-18: qty 0.5

## 2015-05-18 MED ORDER — HEPARIN SODIUM (PORCINE) 5000 UNIT/ML IJ SOLN
5000.0000 [IU] | Freq: Three times a day (TID) | INTRAMUSCULAR | Status: DC
  Filled 2015-05-18: qty 1

## 2015-05-18 MED ORDER — ONDANSETRON HCL 4 MG/2ML IJ SOLN: 4.0000 mg | Freq: Four times a day (QID) | INTRAMUSCULAR | Status: DC | PRN

## 2015-05-18 MED ORDER — ASPIRIN 81 MG PO CHEW
81.0000 mg | CHEWABLE_TABLET | Freq: Every day | ORAL | Status: DC
  Administered 2015-05-18 – 2015-05-20 (×3): 81 mg via ORAL
  Filled 2015-05-18 (×6): qty 1

## 2015-05-18 MED ORDER — SODIUM CHLORIDE 0.9 % IV SOLN
Freq: Once | INTRAVENOUS | Status: AC
  Administered 2015-05-18: 14:00:00 via INTRAVENOUS

## 2015-05-18 MED ORDER — NITROGLYCERIN 0.4 MG SL SUBL
0.4000 mg | SUBLINGUAL_TABLET | SUBLINGUAL | Status: DC | PRN
  Administered 2015-05-18 (×2): 0.4 mg via SUBLINGUAL
  Filled 2015-05-18: qty 1

## 2015-05-18 MED ORDER — OXYCODONE HCL 5 MG PO TABS: 5.0000 mg | ORAL_TABLET | ORAL | Status: DC | PRN

## 2015-05-18 MED ORDER — ACETAMINOPHEN 325 MG PO TABS: 650.0000 mg | ORAL_TABLET | Freq: Four times a day (QID) | ORAL | Status: DC | PRN

## 2015-05-18 MED ORDER — PANTOPRAZOLE SODIUM 40 MG PO TBEC
40.0000 mg | DELAYED_RELEASE_TABLET | Freq: Two times a day (BID) | ORAL | Status: DC
  Administered 2015-05-18 – 2015-05-20 (×4): 40 mg via ORAL
  Filled 2015-05-18 (×4): qty 1

## 2015-05-18 MED ORDER — ROSUVASTATIN CALCIUM 10 MG PO TABS
20.0000 mg | ORAL_TABLET | Freq: Every day | ORAL | Status: DC
  Administered 2015-05-19: 20 mg via ORAL
  Filled 2015-05-18: qty 2

## 2015-05-18 MED ORDER — ACETAMINOPHEN 650 MG RE SUPP: 650.0000 mg | Freq: Four times a day (QID) | RECTAL | Status: DC | PRN

## 2015-05-18 MED ORDER — SODIUM CHLORIDE 0.9 % IJ SOLN
3.0000 mL | Freq: Two times a day (BID) | INTRAMUSCULAR | Status: DC
  Administered 2015-05-20: 3 mL via INTRAVENOUS

## 2015-05-18 MED ORDER — FINASTERIDE 5 MG PO TABS
5.0000 mg | ORAL_TABLET | Freq: Every day | ORAL | Status: DC
  Administered 2015-05-19: 5 mg via ORAL
  Filled 2015-05-18 (×5): qty 1

## 2015-05-18 MED ORDER — TAMSULOSIN HCL 0.4 MG PO CAPS
0.4000 mg | ORAL_CAPSULE | Freq: Every day | ORAL | Status: DC
  Administered 2015-05-18 – 2015-05-19 (×2): 0.4 mg via ORAL
  Filled 2015-05-18 (×2): qty 1

## 2015-05-18 MED ORDER — LOSARTAN POTASSIUM 25 MG PO TABS
25.0000 mg | ORAL_TABLET | Freq: Every day | ORAL | Status: DC
  Administered 2015-05-19 – 2015-05-20 (×2): 25 mg via ORAL
  Filled 2015-05-18 (×5): qty 1

## 2015-05-18 MED ORDER — SODIUM CHLORIDE 0.9 % IV SOLN
Freq: Once | INTRAVENOUS | Status: AC
  Administered 2015-05-18: 12:00:00 via INTRAVENOUS

## 2015-05-18 MED ORDER — IOHEXOL 350 MG/ML SOLN
100.0000 mL | Freq: Once | INTRAVENOUS | Status: AC | PRN
  Administered 2015-05-18: 100 mL via INTRAVENOUS

## 2015-05-18 MED ORDER — SODIUM CHLORIDE 0.9 % IJ SOLN: 3.0000 mL | INTRAMUSCULAR | Status: DC | PRN

## 2015-05-18 MED ORDER — ACETAMINOPHEN 325 MG PO TABS: 650.0000 mg | ORAL_TABLET | ORAL | Status: DC | PRN

## 2015-05-18 MED ORDER — ONDANSETRON HCL 4 MG PO TABS: 4.0000 mg | ORAL_TABLET | Freq: Four times a day (QID) | ORAL | Status: DC | PRN

## 2015-05-18 MED ORDER — SODIUM CHLORIDE 0.9 % IV SOLN: 250.0000 mL | INTRAVENOUS | Status: DC | PRN

## 2015-05-18 MED ORDER — ASPIRIN 81 MG PO CHEW
324.0000 mg | CHEWABLE_TABLET | Freq: Once | ORAL | Status: AC
  Administered 2015-05-18: 324 mg via ORAL
  Filled 2015-05-18: qty 4

## 2015-05-18 MED ORDER — GI COCKTAIL ~~LOC~~: 30.0000 mL | Freq: Four times a day (QID) | ORAL | Status: DC | PRN

## 2015-05-18 MED ORDER — FENTANYL CITRATE (PF) 100 MCG/2ML IJ SOLN
100.0000 ug | Freq: Once | INTRAMUSCULAR | Status: AC
  Administered 2015-05-18: 100 ug via INTRAVENOUS
  Filled 2015-05-18: qty 2

## 2015-05-18 MED ORDER — HYDROCHLOROTHIAZIDE 25 MG PO TABS
25.0000 mg | ORAL_TABLET | Freq: Every day | ORAL | Status: DC
  Administered 2015-05-19 – 2015-05-20 (×2): 25 mg via ORAL
  Filled 2015-05-18 (×2): qty 1

## 2015-05-18 MED ORDER — CARVEDILOL 6.25 MG PO TABS
6.2500 mg | ORAL_TABLET | Freq: Two times a day (BID) | ORAL | Status: DC
  Administered 2015-05-18 – 2015-05-20 (×4): 6.25 mg via ORAL
  Filled 2015-05-18 (×4): qty 1

## 2015-05-18 MED ORDER — SODIUM CHLORIDE 0.9 % IJ SOLN: 3.0000 mL | Freq: Two times a day (BID) | INTRAMUSCULAR | Status: DC

## 2015-05-18 NOTE — Consult Note (Signed)
Primary cardiologist: Dr Rozann Lesches MD Consulting cardiologist: Dr Carlyle Dolly MD  Clinical Summary Mr. Hector Torres is a 74 y.o.male history of carotid artery disease, PAD, CAD with prior PTCA to LCX in 1995 at [place], HTN, HL admitted with chest pain. From Dr Chuck Hint clinic note 01/2015 he has had a long history of chest pain. He had an MPI in 10/2013 that showed mild anteroseptal ischemia, recent cath 01/2015 showed nonobstructive disease, worst lesion was 75% stenosis of a very small OM1.   Reports episode of pain starting around 10AM while playing golf. Symptoms similar to what he has been experiencing over the last several months. Reports midchest/epigastric pressure initialyl 2/10 but increased to 10/10. No SOB or palpitaitons, did feel lightheaded with some blurry vision. Not positional, no relation to food. Symptoms lasted approx 4 hours continously.  01/2015 cath: diffuse nonobstructive CAD 10/2013 MPI mild anteroseptal ischemia 03/2011 echo: LVEF 24-09%, grade I diastolic dysfunction, hypokinesis inferior, inferolateral, and apical walls.  EKG junctional tachycardia, diffuse ST depressions which are chronic D-dimer 0.73, Hgb 17.3, Plt 172, Cr 1.28, BUN 25,  CT PE negative   No Known Allergies  Medications Scheduled Medications:     Infusions:     PRN Medications:  nitroGLYCERIN   Past Medical History  Diagnosis Date  . Carotid artery disease     Bilateral - Dr. Bridgett Larsson  . PVD (peripheral vascular disease)   . TIA (transient ischemic attack)   . Coronary atherosclerosis of native coronary artery     PTCA circumflex at [facility]  . Essential hypertension, benign     Possible white coat  . Mixed hyperlipidemia   . Prostate nodule   . Vitamin D deficiency   . Colon polyp   . Kidney stone   . BPH (benign prostatic hypertrophy)   . COPD (chronic obstructive pulmonary disease)   . Stroke 2010    Mini stroke    Past Surgical History  Procedure Laterality Date    . Eye surgery      Bilateral cataract  . Appendectomy  1947  . Tonsillectomy  1968  . Traumatic injury  1962    Lost tip of index, middle, and ring finger/ right hand  . Angioplasty Left 1995    Cir. Artery  . Cardiac catheterization N/A 02/03/2015    Procedure: Left Heart Cath and Cors/Grafts Angiography;  Surgeon: Sherren Mocha, MD;  Location: [facility];  Service: Cardiovascular;  Laterality: N/A;    Family History  Problem Relation Age of Onset  . Gastric cancer Mother   . Varicose Veins Mother   . Cancer Mother     Stomach  . Prostate cancer Father   . Cancer Father     Prostate  . Other Brother     carotid artery stenosis  . Diabetes Brother   . Leukemia Brother   . Hyperlipidemia Brother   . Hypertension Brother   . Colon cancer Brother   . Hyperlipidemia Brother   . Peripheral vascular disease Brother   . Cancer Brother     Colon and Prostate  . Hypertension Sister   . Hyperlipidemia Sister   . Diabetes Daughter   . Diabetes Son   . Prostate cancer Brother     Social History Mr. Reust reports that he quit smoking about 4 years ago. His smoking use included Cigarettes. He has a 90 pack-year smoking history. He has never used smokeless tobacco. Mr. Frith reports that he does not drink alcohol.  Review of Systems CONSTITUTIONAL: No weight loss, fever, chills, weakness or fatigue.  HEENT: Eyes: No visual loss, blurred vision, double vision or yellow sclerae. No hearing loss, sneezing, congestion, runny nose or sore throat.  SKIN: No rash or itching.  CARDIOVASCULAR: per HPI RESPIRATORY: No shortness of breath, cough or sputum.  GASTROINTESTINAL: No anorexia, nausea, vomiting or diarrhea. No abdominal pain or blood.  GENITOURINARY: no polyuria, no dysuria NEUROLOGICAL: No headache, dizziness, syncope, paralysis, ataxia, numbness or tingling in the extremities. No change in bowel or bladder control.  MUSCULOSKELETAL: No muscle, back pain, joint pain or  stiffness.  HEMATOLOGIC: No anemia, bleeding or bruising.  LYMPHATICS: No enlarged nodes. No history of splenectomy.  PSYCHIATRIC: No history of depression or anxiety.      Physical Examination Blood pressure 92/76, pulse 123, temperature 98 F (36.7 C), temperature source Oral, resp. rate 18, height 5\' 10"  (1.778 m), weight 205 lb (92.987 kg), SpO2 98 %. No intake or output data in the 24 hours ending 05/18/15 1232  HEENT: sclera clear  Cardiovascular: RRR, no m/r/g, no jVD  Respiratory: CTAB  GI: abdomen soft, NT, ND  MSK: no LE edema  Neuro: no focal deficits    Lab Results  Basic Metabolic Panel: No results for input(s): NA, K, CL, CO2, GLUCOSE, BUN, CREATININE, CALCIUM, MG, PHOS in the last 168 hours.  Liver Function Tests: No results for input(s): AST, ALT, ALKPHOS, BILITOT, PROT, ALBUMIN in the last 168 hours.  CBC: No results for input(s): WBC, NEUTROABS, HGB, HCT, MCV, PLT in the last 168 hours.  Cardiac Enzymes: No results for input(s): CKTOTAL, CKMB, CKMBINDEX, TROPONINI in the last 168 hours.  BNP: Invalid input(s): POCBNP     Impression/Recommendations  1. Chest pain - long history of chest pain, cath 01/2015 without significant obstructive disease. Did have a small OM1 with 75% but small vessel and overall low risk, no intervention was indicated at that time - chronic ST/T changes on EKG, trop neg x 1 - would ask medicine to admit for observation overnight for rule out. If no objective evidence of ischemia would not repeat ischemic testing given recent evaluation. Look to intensify medical management of CAD. Continue ASA, coreg, losartan, crestor. Would increase to 6.25mg  bid as additional antianginal and in the setting of his junctional tachycardia.  - would start ppi. NPO at midnight incase further testing needed.    2. Junctional tachycardia - K, Mg, TSH ok - with symptoms of chest pain and lightheadedness unclear if this could have been the  etiology. He is back in NSR and symptoms have resolved - will increase his coreg to 6.25mg  bid  to prevent arrhythmias and as additional antianginal therapy - tele overnight  Carlyle Dolly, M.D.

## 2015-05-18 NOTE — Progress Notes (Signed)
CRITICAL VALUE ALERT  Critical value received:  Troponin 0.57  Date of notification:  05/18/15  Time of notification:  2345  Critical value read back: yes  Nurse who received alert: L. Corine Shelter, RN  MD notified (1st page):  Rogue Bussing (midlevel)  Time of first page:  2350- callback was received at 2351. Was told to page cardiology but no cardiologist on call.  MD notified (2nd page): Dr. Shanon Brow  Time of second page: 0005 05/19/15- spoke with Dr. Shanon Brow at [place]. Says midlevel will call me back. At 0028 order received for lovenox.

## 2015-05-18 NOTE — ED Notes (Signed)
Pt states that he was playing golf today and started having chest pain radiating down both arms.  States that he is also short of breath but that is not uncommon for him due to smoking history.  Did not take any meds pta.

## 2015-05-18 NOTE — H&P (Signed)
[facility] History and Physical  Hector Torres VOZ:[id number] DOB: 04-12-1941 DOA: 05/18/2015  Referring physician: Dr Dayna Barker - [facility] PCP: Redge Gainer, MD   Chief Complaint: CP   HPI: Hector Torres is a 74 y.o. male  Chest pain. Substernal with radiation to shoulders and upper arms bilaterally. Achy in nature. Constant. Episode started about 10 AM. Patient had been playing golf and the pain started around the eighth hole, and continued playing until the 12th hole. Patient stopped playing but stayed in the cart for the remaining 6 holes without relief of his chest pain. States that his arms felt like lead. Patient did not take any medications for her symptoms as he states that nitroglycerin has been given to him in the past and does not work. Denies any shortness of breath, palpitations, nausea, diaphoresis, headache, LOC, lightheadedness, dizziness. Patient does state that he has had ongoing episodes of chest pain for the last 2 years. Of note over the last couple weeks she's had 2 episodes while at rest at home. No change with deep respirations or palpitation of chest wall. Nitroglycerin given in ED without improvement. Recent catheterization in June of this year.  Review of Systems:  Constitutional:  No weight loss, night sweats, Fevers, chills, fatigue.  HEENT:  No headaches, Difficulty swallowing,Tooth/dental problems,Sore throat,  No sneezing, itching, ear ache, nasal congestion, post nasal drip,  Cardio-vascular: Per HPI GI:  No heartburn, indigestion, abdominal pain, nausea, vomiting, diarrhea, change in bowel habits, loss of appetite  Resp:   No shortness of breath with exertion or at rest. No excess mucus, no productive cough, No non-productive cough, No coughing up of blood.No change in color of mucus.No wheezing.No chest wall deformity  Skin:  no rash or lesions.  GU:  no dysuria, change in color of urine, no urgency or frequency. No flank pain.  Musculoskeletal:     No joint pain or swelling. No decreased range of motion. No back pain.  Psych:  No change in mood or affect. No depression or anxiety. No memory loss.   Past Medical History  Diagnosis Date  . Carotid artery disease     Bilateral - Dr. Bridgett Larsson  . PVD (peripheral vascular disease)   . TIA (transient ischemic attack)   . Coronary atherosclerosis of native coronary artery     [facility] at [facility]  . Essential hypertension, benign     Possible white coat  . Mixed hyperlipidemia   . Prostate nodule   . Vitamin D deficiency   . Colon polyp   . Kidney stone   . BPH (benign prostatic hypertrophy)   . COPD (chronic obstructive pulmonary disease)   . Stroke 2010    Mini stroke   Past Surgical History  Procedure Laterality Date  . Eye surgery      Bilateral cataract  . Appendectomy  1947  . Tonsillectomy  1968  . Traumatic injury  1962    Lost tip of index, middle, and ring finger/ right hand  . Angioplasty Left 1995    Cir. Artery  . Cardiac catheterization N/A 02/03/2015    Procedure: Left Heart Cath and Cors/Grafts Angiography;  Surgeon: Sherren Mocha, MD;  Location: [facility];  Service: Cardiovascular;  Laterality: N/A;   Social History:  reports that he quit smoking about 4 years ago. His smoking use included Cigarettes. He has a 90 pack-year smoking history. He has never used smokeless tobacco. He reports that he does not drink alcohol or use illicit  drugs.  No Known Allergies  Family History  Problem Relation Age of Onset  . Gastric cancer Mother   . Varicose Veins Mother   . Cancer Mother     Stomach  . Prostate cancer Father   . Cancer Father     Prostate  . Other Brother     carotid artery stenosis  . Diabetes Brother   . Leukemia Brother   . Hyperlipidemia Brother   . Hypertension Brother   . Colon cancer Brother   . Hyperlipidemia Brother   . Peripheral vascular disease Brother   . Cancer Brother     Colon and Prostate  . Hypertension  Sister   . Hyperlipidemia Sister   . Diabetes Daughter   . Diabetes Son   . Prostate cancer Brother      Prior to Admission medications   Medication Sig Start Date End Date Taking? Authorizing Provider  aspirin 81 MG tablet Take 81 mg by mouth daily.   Yes Historical Provider, MD  carvedilol (COREG) 3.125 MG tablet TAKE 1 TABLET BY MOUTH TWICE A DAY 07/25/14  Yes Wardell Honour, MD  Cholecalciferol (VITAMIN D) 1000 UNITS capsule Take 5,000 Units by mouth daily.    Yes Historical Provider, MD  diphenhydramine-acetaminophen (TYLENOL PM) 25-500 MG TABS tablet Take 2 tablets by mouth at bedtime as needed (sleep/pain).   Yes Historical Provider, MD  finasteride (PROSCAR) 5 MG tablet Take 1 tablet by mouth daily. 01/17/14  Yes Historical Provider, MD  hydrochlorothiazide (HYDRODIURIL) 25 MG tablet Take 1 tablet (25 mg total) by mouth daily. Patient taking differently: Take 25 mg by mouth daily as needed (swelling, fluid).  01/26/15  Yes Wardell Honour, MD  losartan (COZAAR) 25 MG tablet TAKE 1 TABLET BY MOUTH DAILY 10/25/14  Yes Satira Sark, MD  Multiple Vitamin (MULTIVITAMIN PO) Take 1 tablet by mouth daily.     Yes Historical Provider, MD  ranitidine (ZANTAC) 150 MG capsule Take 150 mg by mouth daily as needed for heartburn.    Yes Historical Provider, MD  rosuvastatin (CRESTOR) 20 MG tablet TAKE 1 TABLET BY MOUTH EVERY DAY Patient taking differently: Take 20 mg by mouth at bedtime.  07/25/14  Yes Wardell Honour, MD  tamsulosin (FLOMAX) 0.4 MG CAPS capsule Take 0.4 mg by mouth daily after supper. 11/05/13  Yes Vernie Shanks, MD  vitamin E 400 UNIT capsule Take 400 Units by mouth daily.     Yes Historical Provider, MD  nitroGLYCERIN (NITROSTAT) 0.4 MG SL tablet Place 1 tablet (0.4 mg total) under the tongue every 5 (five) minutes as needed for chest pain. Patient taking differently: Place 0.4 mg under the tongue as needed for chest pain.  10/29/13   Satira Sark, MD   Physical  Exam: Filed Vitals:   05/18/15 1430 05/18/15 1500 05/18/15 1600 05/18/15 1615  BP: 120/71 119/71 193/96   Pulse: 69   66  Temp:      TempSrc:      Resp: 20 20 20 20   Height:      Weight:      SpO2: 100%   96%    Wt Readings from Last 3 Encounters:  05/18/15 92.987 kg (205 lb)  04/14/15 95.709 kg (211 lb)  02/03/15 95.255 kg (210 lb)    General:  Appears calm and comfortable Eyes:  PERRL, normal lids, irises & conjunctiva ENT:  grossly normal hearing, lips & tongue Neck:  no LAD, masses or thyromegaly Cardiovascular:  RRR,  no m/r/g. No LE edema. Telemetry:  SR, no arrhythmias  Respiratory:  CTA bilaterally, no w/r/r. Normal respiratory effort. Abdomen:  soft, ntnd Skin:  no rash or induration seen on limited exam Musculoskeletal:  grossly normal tone BUE/BLE, chest pain nonreproducible on palpation of chest wall Psychiatric:  grossly normal mood and affect, speech fluent and appropriate Neurologic:  grossly non-focal.          Labs on Admission:  Basic Metabolic Panel:  Recent Labs Lab 05/18/15 1220 05/18/15 1250  NA 142 142  K 3.7 3.7  CL 102 101  CO2 31  --   GLUCOSE 118* 116*  BUN 25* 25*  CREATININE 1.28* 1.30*  CALCIUM 8.9  --   MG 1.8  --    Liver Function Tests: No results for input(s): AST, ALT, ALKPHOS, BILITOT, PROT, ALBUMIN in the last 168 hours. No results for input(s): LIPASE, AMYLASE in the last 168 hours. No results for input(s): AMMONIA in the last 168 hours. CBC:  Recent Labs Lab 05/18/15 1220 05/18/15 1250  WBC 8.0  --   HGB 17.3* 18.0*  HCT 50.2 53.0*  MCV 98.0  --   PLT 172  --    Cardiac Enzymes: No results for input(s): CKTOTAL, CKMB, CKMBINDEX, TROPONINI in the last 168 hours.  BNP (last 3 results) No results for input(s): BNP in the last 8760 hours.  ProBNP (last 3 results) No results for input(s): PROBNP in the last 8760 hours.  CBG: No results for input(s): GLUCAP in the last 168 hours.  Radiological Exams on  Admission: Dg Chest 2 View  05/18/2015   CLINICAL DATA:  Intermittent chest pain for past 2 years  EXAM: CHEST  2 VIEW  COMPARISON:  None.  FINDINGS: There is no edema or consolidation. Heart size and pulmonary vascularity are normal. No adenopathy. There is atherosclerotic change in the aorta. There is degenerative change in the thoracic spine. No adenopathy.  IMPRESSION: No edema or consolidation.   Electronically Signed   By: Lowella Grip III M.D.   On: 05/18/2015 13:09   Ct Angio Chest Pe W/cm &/or Wo Cm  05/18/2015   CLINICAL DATA:  Chest and bilateral arm pain while playing golf today. Shortness of breath.  EXAM: CT ANGIOGRAPHY CHEST WITH CONTRAST  TECHNIQUE: Multidetector CT imaging of the chest was performed using the standard protocol during bolus administration of intravenous contrast. Multiplanar CT image reconstructions and MIPs were obtained to evaluate the vascular anatomy.  CONTRAST:  170mL OMNIPAQUE IOHEXOL 350 MG/ML SOLN  COMPARISON:  None.  FINDINGS: Mediastinum/Nodes: No chest wall mass, supraclavicular or axillary lymphadenopathy. Small scattered lymph nodes are noted. The thyroid gland is grossly normal.  The heart is normal in size. No pericardial effusion. No mediastinal or hilar mass or adenopathy. Small scattered lymph nodes are noted. The esophagus is grossly normal. Small hiatal hernia.  The aorta is normal in caliber. Moderate scattered atherosclerotic calcifications. The branch vessels are patent. Coronary artery calcifications are noted.  The pulmonary arterial tree is well opacified. No filling defects to suggest pulmonary embolism.  Lungs/Pleura: Emphysematous changes are noted. No acute pulmonary findings. No pleural effusion. Streaky subsegmental bibasilar atelectasis or scarring. No worrisome pulmonary lesions.  Upper abdomen: No significant findings.  Musculoskeletal: Degenerative changes involving the lower thoracic spine but no fracture or bone lesion. The sternum and  ribs are intact.  Review of the MIP images confirms the above findings.  IMPRESSION: 1. No CT findings for pulmonary embolism. 2. Atherosclerotic calcifications  involving the thoracic aorta but no aneurysm or dissection. 3. Emphysematous changes but no acute pulmonary findings. 4. Coronary artery calcifications.   Electronically Signed   By: Marijo Sanes M.D.   On: 05/18/2015 15:27     Assessment/Plan Principal Problem:   Chest pain Active Problems:   Coronary atherosclerosis of native coronary artery   HTN (hypertension)   HLD (hyperlipidemia)   BPH (benign prostatic hypertrophy)   Tachycardia   CP: cardiac vs GI vs pleuritic vs MSK. Cardiac cath on 01/2015 showed LAD/first diagonal branch w/ 75% stenosis - all others 50% or less. Currently pain free. EKG junctional tach and chronic ST changes. Tropn neg x1. Dr. Harl Bowie of cardiology following and appreciate his input.  - Tele - Increase in Coreg to 6.25 BID per cards - cycle trop - EKG in am - Nitro PRN - cont ASA - GI cocktail - NPO at midnight - Start Protonix 40 BID  HTN: - continue HCTZ and losartan,  - coreg as above  HLD: - continue crestor  BPH - continue flomax and proscar  Code Status: FULL DVT Prophylaxis: Hep Family Communication: None Disposition Plan: pending improvement  MERRELL, DAVID Lenna Sciara, MD Family Medicine [facility] [email or web address] Password [email or web address]

## 2015-05-18 NOTE — ED Notes (Addendum)
Report attempted. Charge Nurse states no nurse has been assigned to bed assigned to patient.

## 2015-05-18 NOTE — ED Provider Notes (Addendum)
CSN: [id number]     Arrival date & time 05/18/15  1204 History   First MD Initiated Contact with Patient 05/18/15 1206     Chief Complaint  Patient presents with  . Chest Pain     (Consider location/radiation/quality/duration/timing/severity/associated sxs/prior Treatment) HPI  74 year old male was playing golf and acute onset of retrosternal chest discomfort radiating down both of his arms. Feel similar to previous episodes that have been worked up multiple times in the past. On arrival here patient's complaining of chest pain shortness of breath. Describes it as a pressure. No nausea, vomiting, diaphoresis.  Past Medical History  Diagnosis Date  . Carotid artery disease     Bilateral - Dr. Bridgett Larsson  . PVD (peripheral vascular disease)   . TIA (transient ischemic attack)   . Coronary atherosclerosis of native coronary artery     [facility]CA circumflex at [facility]  . Essential hypertension, benign     Possible white coat  . Mixed hyperlipidemia   . Prostate nodule   . Vitamin D deficiency   . Colon polyp   . Kidney stone   . BPH (benign prostatic hypertrophy)   . COPD (chronic obstructive pulmonary disease)   . Stroke 2010    Mini stroke   Past Surgical History  Procedure Laterality Date  . Eye surgery      Bilateral cataract  . Appendectomy  1947  . Tonsillectomy  1968  . Traumatic injury  1962    Lost tip of index, middle, and ring finger/ right hand  . Angioplasty Left 1995    Cir. Artery  . Cardiac catheterization N/A 02/03/2015    Procedure: Left Heart Cath and Cors/Grafts Angiography;  Surgeon: Sherren Mocha, MD;  Location: [facility] [place] [facility];  Service: Cardiovascular;  Laterality: N/A;   Family History  Problem Relation Age of Onset  . Gastric cancer Mother   . Varicose Veins Mother   . Cancer Mother     Stomach  . Prostate cancer Father   . Cancer Father     Prostate  . Other Brother     carotid artery stenosis  . Diabetes Brother   . Leukemia Brother   .  Hyperlipidemia Brother   . Hypertension Brother   . Colon cancer Brother   . Hyperlipidemia Brother   . Peripheral vascular disease Brother   . Cancer Brother     Colon and Prostate  . Hypertension Sister   . Hyperlipidemia Sister   . Diabetes Daughter   . Diabetes Son   . Prostate cancer Brother    Social History  Substance Use Topics  . Smoking status: Former Smoker -- 1.50 packs/day for 60 years    Types: Cigarettes    Quit date: 02/27/2011  . Smokeless tobacco: Never Used  . Alcohol Use: No    Review of Systems  Constitutional: Positive for fatigue. Negative for fever, chills, diaphoresis, activity change and appetite change.  Eyes: Negative for redness.  Respiratory: Positive for chest tightness and shortness of breath. Negative for apnea and cough.   Cardiovascular: Positive for chest pain.  Gastrointestinal: Negative for nausea, vomiting and abdominal pain.  Endocrine: Negative for polydipsia and polyuria.  Skin: Negative for pallor and wound.  All other systems reviewed and are negative.     Allergies  Review of patient's allergies indicates no known allergies.  Home Medications   Prior to Admission medications   Medication Sig Start Date End Date Taking? Authorizing Provider  aspirin 81 MG tablet Take  81 mg by mouth daily.   Yes Historical Provider, MD  carvedilol (COREG) 3.125 MG tablet TAKE 1 TABLET BY MOUTH TWICE A DAY 07/25/14  Yes Wardell Honour, MD  Cholecalciferol (VITAMIN D) 1000 UNITS capsule Take 5,000 Units by mouth daily.    Yes Historical Provider, MD  diphenhydramine-acetaminophen (TYLENOL PM) 25-500 MG TABS tablet Take 2 tablets by mouth at bedtime as needed (sleep/pain).   Yes Historical Provider, MD  finasteride (PROSCAR) 5 MG tablet Take 1 tablet by mouth daily. 01/17/14  Yes Historical Provider, MD  hydrochlorothiazide (HYDRODIURIL) 25 MG tablet Take 1 tablet (25 mg total) by mouth daily. Patient taking differently: Take 25 mg by mouth daily  as needed (swelling, fluid).  01/26/15  Yes Wardell Honour, MD  losartan (COZAAR) 25 MG tablet TAKE 1 TABLET BY MOUTH DAILY 10/25/14  Yes Satira Sark, MD  Multiple Vitamin (MULTIVITAMIN PO) Take 1 tablet by mouth daily.     Yes Historical Provider, MD  ranitidine (ZANTAC) 150 MG capsule Take 150 mg by mouth daily as needed for heartburn.    Yes Historical Provider, MD  rosuvastatin (CRESTOR) 20 MG tablet TAKE 1 TABLET BY MOUTH EVERY DAY Patient taking differently: Take 20 mg by mouth at bedtime.  07/25/14  Yes Wardell Honour, MD  tamsulosin (FLOMAX) 0.4 MG CAPS capsule Take 0.4 mg by mouth daily after supper. 11/05/13  Yes Vernie Shanks, MD  vitamin E 400 UNIT capsule Take 400 Units by mouth daily.     Yes Historical Provider, MD  nitroGLYCERIN (NITROSTAT) 0.4 MG SL tablet Place 1 tablet (0.4 mg total) under the tongue every 5 (five) minutes as needed for chest pain. Patient taking differently: Place 0.4 mg under the tongue as needed for chest pain.  10/29/13   Satira Sark, MD   BP 119/71 mmHg  Pulse 69  Temp(Src) 98 F (36.7 C) (Oral)  Resp 20  Ht 5\' 10"  (1.778 m)  Wt 205 lb (92.987 kg)  BMI 29.41 kg/m2  SpO2 100% Physical Exam  Constitutional: He is oriented to person, place, and time. He appears well-developed and well-nourished.  HENT:  Head: Normocephalic and atraumatic.  Eyes: Conjunctivae and EOM are normal.  Neck: Normal range of motion. Neck supple.  Cardiovascular: Regular rhythm.  Tachycardia present.   Pulmonary/Chest: Effort normal. No respiratory distress.  Abdominal: Soft. There is no tenderness.  Musculoskeletal: Normal range of motion. He exhibits no edema or tenderness.  Neurological: He is alert and oriented to person, place, and time.  Skin: Skin is warm and dry.  Nursing note and vitals reviewed.   ED Course  Procedures (including critical care time) Labs Review Labs Reviewed  BASIC METABOLIC PANEL - Abnormal; Notable for the following:     Glucose, Bld 118 (*)    BUN 25 (*)    Creatinine, Ser 1.28 (*)    GFR calc non Af Amer 53 (*)    All other components within normal limits  CBC - Abnormal; Notable for the following:    Hemoglobin 17.3 (*)    All other components within normal limits  D-DIMER, QUANTITATIVE (NOT AT [facility]) - Abnormal; Notable for the following:    D-Dimer, Quant 0.73 (*)    All other components within normal limits  I-STAT CHEM 8, ED - Abnormal; Notable for the following:    BUN 25 (*)    Creatinine, Ser 1.30 (*)    Glucose, Bld 116 (*)    Hemoglobin 18.0 (*)  HCT 53.0 (*)    All other components within normal limits  MAGNESIUM  TSH  I-STAT CHEM 8, ED  I-STAT TROPOININ, ED    Imaging Review Dg Chest 2 View  05/18/2015   CLINICAL DATA:  Intermittent chest pain for past 2 years  EXAM: CHEST  2 VIEW  COMPARISON:  None.  FINDINGS: There is no edema or consolidation. Heart size and pulmonary vascularity are normal. No adenopathy. There is atherosclerotic change in the aorta. There is degenerative change in the thoracic spine. No adenopathy.  IMPRESSION: No edema or consolidation.   Electronically Signed   By: Lowella Grip III M.D.   On: 05/18/2015 13:09   Ct Angio Chest Pe W/cm &/or Wo Cm  05/18/2015   CLINICAL DATA:  Chest and bilateral arm pain while playing golf today. Shortness of breath.  EXAM: CT ANGIOGRAPHY CHEST WITH CONTRAST  TECHNIQUE: Multidetector CT imaging of the chest was performed using the standard protocol during bolus administration of intravenous contrast. Multiplanar CT image reconstructions and MIPs were obtained to evaluate the vascular anatomy.  CONTRAST:  162mL OMNIPAQUE IOHEXOL 350 MG/ML SOLN  COMPARISON:  None.  FINDINGS: Mediastinum/Nodes: No chest wall mass, supraclavicular or axillary lymphadenopathy. Small scattered lymph nodes are noted. The thyroid gland is grossly normal.  The heart is normal in size. No pericardial effusion. No mediastinal or hilar mass or adenopathy.  Small scattered lymph nodes are noted. The esophagus is grossly normal. Small hiatal hernia.  The aorta is normal in caliber. Moderate scattered atherosclerotic calcifications. The branch vessels are patent. Coronary artery calcifications are noted.  The pulmonary arterial tree is well opacified. No filling defects to suggest pulmonary embolism.  Lungs/Pleura: Emphysematous changes are noted. No acute pulmonary findings. No pleural effusion. Streaky subsegmental bibasilar atelectasis or scarring. No worrisome pulmonary lesions.  Upper abdomen: No significant findings.  Musculoskeletal: Degenerative changes involving the lower thoracic spine but no fracture or bone lesion. The sternum and ribs are intact.  Review of the MIP images confirms the above findings.  IMPRESSION: 1. No CT findings for pulmonary embolism. 2. Atherosclerotic calcifications involving the thoracic aorta but no aneurysm or dissection. 3. Emphysematous changes but no acute pulmonary findings. 4. Coronary artery calcifications.   Electronically Signed   By: Marijo Sanes M.D.   On: 05/18/2015 15:27   I have personally reviewed and evaluated these images and lab results as part of my medical decision-making.   EKG Interpretation   Date/Time:  Thursday May 18 2015 14:27:07 EDT Ventricular Rate:  67 PR Interval:  187 QRS Duration: 88 QT Interval:  456 QTC Calculation: 481 R Axis:   26 Text Interpretation:  Sinus rhythm Anteroseptal infarct, old Repol abnrm  suggests ischemia, lateral leads similar to previous earlier with a sinus  rhythm  Confirmed by [facility] MD, Corene Cornea [phone]) on 05/18/2015 2:48:17 PM      MDM   Final diagnoses:  Other chest pain  Tachycardia   74 year old male with a recent normal coronary catheterization presents to the ED with acute onset of chest pain radiating both arms. Initial bleeding had soft blood pressures with a heart rate in the 120s his EKG looked in a junctional tachycardia with significant  ST depressions and some ST elevations in lead V1 and V2 but not really meet criteria for STEMI. Dr. Harl Bowie, cardiologist, was in the emergency department and discussed the case with him immediately and showed him the EKG. Relative to old EKGs his x-ray looks similar and just likely worsened  because of his rate. Fluids were started nitroglycerin was given with some relief of his symptoms. At approximately an hour he converted to a sinus rhythm still with some ST depressions but not as marked as with his tachycardia. First troponin was negative however d-dimer is elevated to CT scan was done which was negative for any pulmonary embolus. Discussed case again with Dr. Harl Bowie and he recommended admission for observation and delta troponins for ACS rule out so I discussed the case with medicine who will admit.       Merrily Pew, MD 05/18/15 1611  Merrily Pew, MD 05/18/15 [phone]

## 2015-05-19 ENCOUNTER — Encounter (HOSPITAL_COMMUNITY)
Admission: EM | Disposition: A | Discharge status: Home / Self Care | Payer: Self-pay | Source: Home / Self Care | Attending: Cardiology

## 2015-05-19 ENCOUNTER — Encounter (HOSPITAL_COMMUNITY): Payer: Self-pay | Admitting: General Practice

## 2015-05-19 DIAGNOSIS — Z9861 Coronary angioplasty status: Secondary | ICD-10-CM | POA: Diagnosis not present

## 2015-05-19 DIAGNOSIS — Z9842 Cataract extraction status, left eye: Secondary | ICD-10-CM | POA: Diagnosis not present

## 2015-05-19 DIAGNOSIS — I10 Essential (primary) hypertension: Secondary | ICD-10-CM

## 2015-05-19 DIAGNOSIS — I209 Angina pectoris, unspecified: Secondary | ICD-10-CM | POA: Diagnosis not present

## 2015-05-19 DIAGNOSIS — I2510 Atherosclerotic heart disease of native coronary artery without angina pectoris: Secondary | ICD-10-CM | POA: Diagnosis not present

## 2015-05-19 DIAGNOSIS — R0789 Other chest pain: Secondary | ICD-10-CM | POA: Diagnosis present

## 2015-05-19 DIAGNOSIS — Z87891 Personal history of nicotine dependence: Secondary | ICD-10-CM | POA: Diagnosis not present

## 2015-05-19 DIAGNOSIS — I214 Non-ST elevation (NSTEMI) myocardial infarction: Secondary | ICD-10-CM | POA: Diagnosis present

## 2015-05-19 DIAGNOSIS — N183 Chronic kidney disease, stage 3 (moderate): Secondary | ICD-10-CM | POA: Diagnosis present

## 2015-05-19 DIAGNOSIS — E559 Vitamin D deficiency, unspecified: Secondary | ICD-10-CM | POA: Diagnosis present

## 2015-05-19 DIAGNOSIS — I25119 Atherosclerotic heart disease of native coronary artery with unspecified angina pectoris: Secondary | ICD-10-CM

## 2015-05-19 DIAGNOSIS — R079 Chest pain, unspecified: Secondary | ICD-10-CM | POA: Diagnosis not present

## 2015-05-19 DIAGNOSIS — J449 Chronic obstructive pulmonary disease, unspecified: Secondary | ICD-10-CM | POA: Diagnosis present

## 2015-05-19 DIAGNOSIS — Z23 Encounter for immunization: Secondary | ICD-10-CM | POA: Diagnosis not present

## 2015-05-19 DIAGNOSIS — Z7982 Long term (current) use of aspirin: Secondary | ICD-10-CM | POA: Diagnosis not present

## 2015-05-19 DIAGNOSIS — I471 Supraventricular tachycardia: Secondary | ICD-10-CM | POA: Diagnosis present

## 2015-05-19 DIAGNOSIS — I252 Old myocardial infarction: Secondary | ICD-10-CM | POA: Insufficient documentation

## 2015-05-19 DIAGNOSIS — R000 Tachycardia, unspecified: Secondary | ICD-10-CM | POA: Diagnosis not present

## 2015-05-19 DIAGNOSIS — I1310 Hypertensive heart and chronic kidney disease without heart failure, with stage 1 through stage 4 chronic kidney disease, or unspecified chronic kidney disease: Secondary | ICD-10-CM | POA: Diagnosis present

## 2015-05-19 DIAGNOSIS — Z8673 Personal history of transient ischemic attack (TIA), and cerebral infarction without residual deficits: Secondary | ICD-10-CM | POA: Diagnosis not present

## 2015-05-19 DIAGNOSIS — Z79899 Other long term (current) drug therapy: Secondary | ICD-10-CM | POA: Diagnosis not present

## 2015-05-19 DIAGNOSIS — E782 Mixed hyperlipidemia: Secondary | ICD-10-CM | POA: Diagnosis present

## 2015-05-19 DIAGNOSIS — N400 Enlarged prostate without lower urinary tract symptoms: Secondary | ICD-10-CM | POA: Diagnosis present

## 2015-05-19 DIAGNOSIS — I739 Peripheral vascular disease, unspecified: Secondary | ICD-10-CM | POA: Diagnosis present

## 2015-05-19 DIAGNOSIS — Z8249 Family history of ischemic heart disease and other diseases of the circulatory system: Secondary | ICD-10-CM | POA: Diagnosis not present

## 2015-05-19 DIAGNOSIS — Z9841 Cataract extraction status, right eye: Secondary | ICD-10-CM | POA: Diagnosis not present

## 2015-05-19 HISTORY — PX: CARDIAC CATHETERIZATION: SHX172

## 2015-05-19 LAB — PROTIME-INR
INR: 1.07 (ref 0.00–1.49)
PROTHROMBIN TIME: 14.1 s (ref 11.6–15.2)

## 2015-05-19 LAB — TROPONIN I: Troponin I: 0.54 ng/mL (ref <0.031)

## 2015-05-19 SURGERY — LEFT HEART CATH AND CORONARY ANGIOGRAPHY

## 2015-05-19 MED ORDER — LIDOCAINE HCL (PF) 1 % IJ SOLN
INTRAMUSCULAR | Status: AC
  Filled 2015-05-19: qty 30

## 2015-05-19 MED ORDER — VERAPAMIL HCL 2.5 MG/ML IV SOLN: INTRAVENOUS | Status: DC | PRN

## 2015-05-19 MED ORDER — FENTANYL CITRATE (PF) 100 MCG/2ML IJ SOLN
INTRAMUSCULAR | Status: DC | PRN
  Administered 2015-05-19: 50 ug via INTRAVENOUS

## 2015-05-19 MED ORDER — HEPARIN SODIUM (PORCINE) 1000 UNIT/ML IJ SOLN
INTRAMUSCULAR | Status: DC | PRN
  Administered 2015-05-19: 4500 [IU] via INTRAVENOUS

## 2015-05-19 MED ORDER — HEPARIN (PORCINE) IN NACL 2-0.9 UNIT/ML-% IJ SOLN
INTRAMUSCULAR | Status: AC
  Filled 2015-05-19: qty 1000

## 2015-05-19 MED ORDER — SODIUM CHLORIDE 0.9 % IV SOLN
INTRAVENOUS | Status: AC
  Administered 2015-05-19: 09:00:00 via INTRAVENOUS

## 2015-05-19 MED ORDER — SODIUM CHLORIDE 0.9 % WEIGHT BASED INFUSION: 1.0000 mL/kg/h | INTRAVENOUS | Status: DC

## 2015-05-19 MED ORDER — SODIUM CHLORIDE 0.9 % IV SOLN: 250.0000 mL | INTRAVENOUS | Status: DC | PRN

## 2015-05-19 MED ORDER — SODIUM CHLORIDE 0.9 % IJ SOLN: 3.0000 mL | INTRAMUSCULAR | Status: DC | PRN

## 2015-05-19 MED ORDER — ACETAMINOPHEN 325 MG PO TABS: 650.0000 mg | ORAL_TABLET | ORAL | Status: DC | PRN

## 2015-05-19 MED ORDER — HEPARIN (PORCINE) IN NACL 100-0.45 UNIT/ML-% IJ SOLN
1400.0000 [IU]/h | INTRAMUSCULAR | Status: DC
  Administered 2015-05-19: 1400 [IU]/h via INTRAVENOUS
  Filled 2015-05-19: qty 250

## 2015-05-19 MED ORDER — MIDAZOLAM HCL 2 MG/2ML IJ SOLN
INTRAMUSCULAR | Status: AC
  Filled 2015-05-19: qty 4

## 2015-05-19 MED ORDER — HYDRALAZINE HCL 20 MG/ML IJ SOLN
10.0000 mg | INTRAMUSCULAR | Status: AC
  Administered 2015-05-19: 10 mg via INTRAVENOUS

## 2015-05-19 MED ORDER — NITROGLYCERIN 2 % TD OINT
0.5000 [in_us] | TOPICAL_OINTMENT | Freq: Once | TRANSDERMAL | Status: AC
  Administered 2015-05-19: 0.5 [in_us] via TOPICAL
  Filled 2015-05-19: qty 1

## 2015-05-19 MED ORDER — SODIUM CHLORIDE 0.9 % WEIGHT BASED INFUSION
3.0000 mL/kg/h | INTRAVENOUS | Status: AC
  Administered 2015-05-19 (×2): 3 mL/kg/h via INTRAVENOUS

## 2015-05-19 MED ORDER — IOHEXOL 350 MG/ML SOLN
INTRAVENOUS | Status: DC | PRN
  Administered 2015-05-19: 85 mL via INTRACARDIAC

## 2015-05-19 MED ORDER — ENOXAPARIN SODIUM 100 MG/ML ~~LOC~~ SOLN
1.0000 mg/kg | Freq: Once | SUBCUTANEOUS | Status: AC
  Administered 2015-05-19: 95 mg via SUBCUTANEOUS
  Filled 2015-05-19: qty 1

## 2015-05-19 MED ORDER — MIDAZOLAM HCL 2 MG/2ML IJ SOLN
INTRAMUSCULAR | Status: DC | PRN
  Administered 2015-05-19: 2 mg via INTRAVENOUS

## 2015-05-19 MED ORDER — HYDRALAZINE HCL 20 MG/ML IJ SOLN
INTRAMUSCULAR | Status: AC
  Filled 2015-05-19: qty 1

## 2015-05-19 MED ORDER — SODIUM CHLORIDE 0.9 % IJ SOLN
3.0000 mL | Freq: Two times a day (BID) | INTRAMUSCULAR | Status: DC
  Administered 2015-05-20: 3 mL via INTRAVENOUS

## 2015-05-19 MED ORDER — VERAPAMIL HCL 2.5 MG/ML IV SOLN
INTRAVENOUS | Status: AC
  Filled 2015-05-19: qty 2

## 2015-05-19 MED ORDER — ONDANSETRON HCL 4 MG/2ML IJ SOLN: 4.0000 mg | Freq: Four times a day (QID) | INTRAMUSCULAR | Status: DC | PRN

## 2015-05-19 MED ORDER — HEPARIN SODIUM (PORCINE) 1000 UNIT/ML IJ SOLN
INTRAMUSCULAR | Status: AC
  Filled 2015-05-19: qty 1

## 2015-05-19 MED ORDER — SODIUM CHLORIDE 0.9 % IJ SOLN: 3.0000 mL | Freq: Two times a day (BID) | INTRAMUSCULAR | Status: DC

## 2015-05-19 MED ORDER — LIDOCAINE HCL (PF) 1 % IJ SOLN
INTRAMUSCULAR | Status: DC | PRN
Start: 2015-05-19 — End: 2015-05-19
  Administered 2015-05-19: 15:00:00 via INTRA_ARTERIAL

## 2015-05-19 MED ORDER — FENTANYL CITRATE (PF) 100 MCG/2ML IJ SOLN
INTRAMUSCULAR | Status: AC
  Filled 2015-05-19: qty 4

## 2015-05-19 MED ORDER — SODIUM CHLORIDE 0.9 % WEIGHT BASED INFUSION: 3.0000 mL/kg/h | INTRAVENOUS | Status: DC

## 2015-05-19 MED ORDER — ASPIRIN 81 MG PO CHEW: 81.0000 mg | CHEWABLE_TABLET | ORAL | Status: AC

## 2015-05-19 SURGICAL SUPPLY — 10 items
CATH INFINITI 5FR ANG PIGTAIL (CATHETERS) ×3 IMPLANT
CATH OPTITORQUE TIG 4.0 5F (CATHETERS) ×3 IMPLANT
DEVICE RAD COMP TR BAND LRG (VASCULAR PRODUCTS) ×3 IMPLANT
GLIDESHEATH SLEND A-KIT 6F 22G (SHEATH) ×3 IMPLANT
KIT HEART LEFT (KITS) ×3 IMPLANT
PACK CARDIAC CATHETERIZATION (CUSTOM PROCEDURE TRAY) ×3 IMPLANT
SYR MEDRAD MARK V 150ML (SYRINGE) ×3 IMPLANT
TRANSDUCER W/STOPCOCK (MISCELLANEOUS) ×3 IMPLANT
TUBING CIL FLEX 10 FLL-RA (TUBING) ×3 IMPLANT
WIRE SAFE-T 1.5MM-J .035X260CM (WIRE) ×3 IMPLANT

## 2015-05-19 NOTE — Progress Notes (Signed)
PROGRESS NOTE  Hector Torres ZOX:[id number] DOB: 03-29-1941 DOA: 05/18/2015 PCP: Redge Gainer, MD  Summary: 89yom PMH CAD, PTCH, HTN, presented with CP. H/o longstanding chest pain. 01/2015 cath: diffuse nonobstructive CAD. Initial EKG with chronic ST/T changes and first troponin negative. Cardiology saw and recommended obs, increased Coreg, possible stress in AM  Assessment/Plan: 1. Chest pain/USA. Very mild chest pain right now. Troponin appears to have peaked. EKG with chronic ST/T changes.  2. Elevated troponin. Suspect NSTEMI. 0.54 today.  3. CAD. Continue ASA, Coreg (at higher dose), losartan, Crestor. 4. Accelerated HTN, asymptomatic. 5. Junctional tacycardia. TSH, Mg, K WNL. Etiology unclear. Coreg increased to prevent arrhythmias and additional antianginal therapy currently. 6. CKD stage II-III, stable.   Overall much improved with minimal chest pain. Discussed with cardiology, agree with plan for transfer to [facility] for [place].  Accepting physician Dr. Debara Pickett, [facility] will sign off, please call if we can be of assistance at [facility].  Code Status: full code DVT prophylaxis: SCDs Family discussion: Discussed plan in detail. No further concerns at this time. Disposition Plan: Transfer to [facility] for LHC  Hector Hodgkins, MD  [facility]  Pager [phone] If 7PM-7AM, please contact night-coverage at [email or web address], password [facility] 05/19/2015, 9:25 AM    Consultants:  Cardiology  Procedures:    Antibiotics:    HPI/Subjective: Feeling okay today. Slight chest pain, heaviness character. No n/v or SOB.   Objective: Filed Vitals:   05/18/15 2220 05/19/15 0000 05/19/15 0616 05/19/15 0847  BP: 179/72 167/69 171/75 211/93  Pulse: 61 66 63 65  Temp: 98.5 F (36.9 C) 98.4 F (36.9 C) 98.2 F (36.8 C)   TempSrc: Oral Oral Oral   Resp: 18 18 16    Height:      Weight:      SpO2: 99% 97% 96%    No intake or output data in the 24 hours ending 05/19/15 0925   Filed Weights   05/18/15  1216 05/18/15 1700  Weight: 92.987 kg (205 lb) 95.255 kg (210 lb)    Exam:   VSS, afebrile, not hypoxic  General:  Appears comfortable, calm. Eyes: PERRL, normal lids, irises ENT: grossly normal hearing, lips, tongue Neck: no LAD, masses, thyromegaly Cardiovascular: Regular rate and rhythm,  2/6 systolic murmur LUSB. No rub or gallop. No lower extremity edema. Respiratory: Clear to auscultation bilaterally, no wheezes, rales or rhonchi. Normal respiratory effort. Abdomen: soft, ntnd Skin: no rash or induration  Musculoskeletal: grossly normal tone bilateral upper and lower extremities Psychiatric: grossly normal mood and affect, speech fluent and appropriate Neurologic: grossly non-focal.  New data reviewed:  Troponin 0.54  Pertinent data since admission:  CT chest: No PE. No acute pulmonary findings  CXR no acute disease  EKG SR, LAD, anteroseptal MI, old; ST/T wave changes, consider inferolateral ischemia; lateral changes are old compared to 01/30/2015  Pending data:    Scheduled Meds: . aspirin  81 mg Oral Daily  . carvedilol  6.25 mg Oral BID WC  . finasteride  5 mg Oral Daily  . hydrochlorothiazide  25 mg Oral Daily  . losartan  25 mg Oral Daily  . pantoprazole  40 mg Oral BID AC  . rosuvastatin  20 mg Oral QHS  . sodium chloride  3 mL Intravenous Q12H  . sodium chloride  3 mL Intravenous Q12H  . tamsulosin  0.4 mg Oral QPC supper   Continuous Infusions: . sodium chloride 100 mL/hr at 05/19/15 0908  . heparin      Principal Problem:  Chest pain Active Problems:   Coronary atherosclerosis of native coronary artery   HTN (hypertension)   HLD (hyperlipidemia)   BPH (benign prostatic hypertrophy)   Tachycardia   Time spent 25 minutes  By signing my name below, I, Hector Torres attest that this documentation has been prepared under the direction and in the presence of Hector Hodgkins, MD   Electronically signed: Rhett Torres  05/19/2015 9:36 AM  I  personally performed the services described in this documentation. All medical record entries made by the scribe were at my direction. I have reviewed the chart and agree that the record reflects my personal performance and is accurate and complete. Hector Hodgkins, MD

## 2015-05-19 NOTE — Progress Notes (Signed)
The previous critical troponin was the last of the troponins being cycled. Contacted midlevel to get an order to continue cycling troponin levels. Order received. Will continue to monitor closely.

## 2015-05-19 NOTE — Progress Notes (Signed)
10:30 Care link arrived to pick up the patient.  I notified them that the patients BP was 250/120 manually and that the patient is scheduled to begin heparin IV drip. Dr. Sarajane Jews was notified of the patients BP and was asked about starting the heparin drip prior to transfer since the patient is scheduled to have his cardiac cath today.  New orders was given for BP but he said for me to contact Cardiology since they were the ones that ordered the drip to clarify the start of the drip.  Paged Dr. Harl Bowie but after the patient received the hydralazine the transporters for care link agreed that if needed the heparin could be started on the drip once at [facility].  Dr. Harl Bowie did call back shortly after the page, but the patient had already left.  Patient left the floor via stretcher with the care link staff.   1057 Report was called to Judson Roch of 3W.  She verbalized understanding and was told that once the patient got to the unit and she had questions to contact [place] on our unit.  She verbalized understanding and had no further questions at this time.  Judson Roch was made aware of the Hydralazine given prior to transfer and that the heparin was not started due to questions about time of cath.

## 2015-05-19 NOTE — Care Management Note (Signed)
Case Management Note  Patient Details  Name: Hector Torres MRN: [id number] Date of Birth: [birth year]  Subjective/Objective:                  Pt admitted from home with CP. Pt lives alone.    Action/Plan: Pt to transfer to [facility] for cath and further care. CM on receiving unit to follow for discharge planning needs.  Expected Discharge Date:  05/20/15               Expected Discharge Plan:  Acute to Acute Transfer  In-House Referral:  NA  Discharge planning Services  CM Consult  Post Acute Care Choice:  NA Choice offered to:  NA  DME Arranged:    DME Agency:     HH Arranged:    HH Agency:     Status of Service:  Completed, signed off  Medicare Important Message Given:    Date Medicare IM Given:    Medicare IM give by:    Date Additional Medicare IM Given:    Additional Medicare Important Message give by:     If discussed at [place] of Stay Meetings, dates discussed:    Additional Comments:  Joylene Draft, RN 05/19/2015, 10:38 AM

## 2015-05-19 NOTE — Progress Notes (Signed)
Patient ID: Hector Torres, male   DOB: 1940/09/14, 74 y.o.   MRN: [id number]     Subjective:    Chest pain on and off overnight  Objective:   Temp:  [97.8 F (36.6 C)-98.5 F (36.9 C)] 98.2 F (36.8 C) (09/30 0616) Pulse Rate:  [61-123] 63 (09/30 0616) Resp:  [13-20] 16 (09/30 0616) BP: (81-193)/(57-96) 171/75 mmHg (09/30 0616) SpO2:  [95 %-100 %] 96 % (09/30 0616) Weight:  [205 lb (92.987 kg)-210 lb (95.255 kg)] 210 lb (95.255 kg) (09/29 1700) Last BM Date: 05/17/15  Filed Weights   05/18/15 1216 05/18/15 1700  Weight: 205 lb (92.987 kg) 210 lb (95.255 kg)   No intake or output data in the 24 hours ending 05/19/15 0843   Exam:  General: NAD  Resp: CTAB  Cardiac: RRR, no m/r/g, no jvd  GI: abdomen soft, NT, ND  MSK: no LE edema  Neuro: no focal deficits  Psych: appropriate affect  Lab Results:  Basic Metabolic Panel:  Recent Labs Lab 05/18/15 1220 05/18/15 1250  NA 142 142  K 3.7 3.7  CL 102 101  CO2 31  --   GLUCOSE 118* 116*  BUN 25* 25*  CREATININE 1.28* 1.30*  CALCIUM 8.9  --   MG 1.8  --     Liver Function Tests: No results for input(s): AST, ALT, ALKPHOS, BILITOT, PROT, ALBUMIN in the last 168 hours.  CBC:  Recent Labs Lab 05/18/15 1220 05/18/15 1250  WBC 8.0  --   HGB 17.3* 18.0*  HCT 50.2 53.0*  MCV 98.0  --   PLT 172  --     Cardiac Enzymes:  Recent Labs Lab 05/18/15 1700 05/18/15 2240 05/19/15 0521  TROPONINI 0.17* 0.57* 0.54*    BNP: No results for input(s): PROBNP in the last 8760 hours.  Coagulation: No results for input(s): INR in the last 168 hours.  ECG:   Medications:   Scheduled Medications: . aspirin  81 mg Oral Daily  . carvedilol  6.25 mg Oral BID WC  . finasteride  5 mg Oral Daily  . hydrochlorothiazide  25 mg Oral Daily  . Influenza vac split quadrivalent PF  0.5 mL Intramuscular Tomorrow-1000  . losartan  25 mg Oral Daily  . pantoprazole  40 mg Oral BID AC  . pneumococcal 23 valent vaccine   0.5 mL Intramuscular Tomorrow-1000  . rosuvastatin  20 mg Oral QHS  . sodium chloride  3 mL Intravenous Q12H  . sodium chloride  3 mL Intravenous Q12H  . tamsulosin  0.4 mg Oral QPC supper     Infusions:     PRN Medications:  sodium chloride, acetaminophen **OR** acetaminophen, gi cocktail, nitroGLYCERIN, ondansetron **OR** ondansetron (ZOFRAN) IV, oxyCODONE, sodium chloride     Assessment/Plan    1. NSTEMI - long history of chest pain, cath 01/2015 without significant obstructive disease. Did have a small OM1 with 75% but small vessel and overall low risk, no intervention was indicated at that time - episode yesterday far more intense than prior episodes.  - chronic ST/T changes on EKG. Trop initially negative, has trended up to 0.57 and now trending down.  - despite normal cath 01/2015, increased severity in symptoms and + troponin suggests new occlusive disease, possibly ruptured plaque. Will start heparin gtt, start IVF for borderline renal function and plan for transfer to [facility] for cath   2. HTN - elevated this AM, his is due to get his AM meds. Will also given nitropaste, follow  bp.       Carlyle Dolly, M.D.

## 2015-05-19 NOTE — Progress Notes (Signed)
Critical troponin level of 0.57 received from lab. Patient denies any chest pain and says he feels "fine". Will page MD about critical level.

## 2015-05-19 NOTE — Progress Notes (Signed)
ANTICOAGULATION CONSULT NOTE - Initial Consult  Pharmacy Consult for heparin Indication: chest pain/ACS  No Known Allergies  Patient Measurements: Height: 5\' 10"  (177.8 cm) Weight: 210 lb (95.255 kg) IBW/kg (Calculated) : 73 Heparin dosing weight: 92.5 kg   Vital Signs: Temp: 98.2 F (36.8 C) (09/30 0616) Temp Source: Oral (09/30 0616) BP: 211/93 mmHg (09/30 0847) Pulse Rate: 65 (09/30 0847)  Labs:  Recent Labs  05/18/15 1220 05/18/15 1250 05/18/15 1700 05/18/15 2240 05/19/15 0521  HGB 17.3* 18.0*  --   --   --   HCT 50.2 53.0*  --   --   --   PLT 172  --   --   --   --   CREATININE 1.28* 1.30*  --   --   --   TROPONINI  --   --  0.17* 0.57* 0.54*    Estimated Creatinine Clearance: 56.9 mL/min (by C-G formula based on Cr of 1.3).   Medical History: Past Medical History  Diagnosis Date  . Carotid artery disease     Bilateral - Dr. Bridgett Larsson  . PVD (peripheral vascular disease)   . TIA (transient ischemic attack)   . Coronary atherosclerosis of native coronary artery     PTCA circumflex at [facility]  . Essential hypertension, benign     Possible white coat  . Mixed hyperlipidemia   . Prostate nodule   . Vitamin D deficiency   . Colon polyp   . Kidney stone   . BPH (benign prostatic hypertrophy)   . COPD (chronic obstructive pulmonary disease)   . Stroke 2010    Mini stroke    Medications:  Prescriptions prior to admission  Medication Sig Dispense Refill Last Dose  . aspirin 81 MG tablet Take 81 mg by mouth daily.   05/17/2015 at Unknown time  . carvedilol (COREG) 3.125 MG tablet TAKE 1 TABLET BY MOUTH TWICE A DAY 60 tablet 11 05/18/2015 at 0800  . Cholecalciferol (VITAMIN D) 1000 UNITS capsule Take 5,000 Units by mouth daily.    05/17/2015 at Unknown time  . diphenhydramine-acetaminophen (TYLENOL PM) 25-500 MG TABS tablet Take 2 tablets by mouth at bedtime as needed (sleep/pain).   Past Week at Unknown time  . finasteride (PROSCAR) 5 MG tablet Take 1 tablet  by mouth daily.   05/17/2015 at Unknown time  . hydrochlorothiazide (HYDRODIURIL) 25 MG tablet Take 1 tablet (25 mg total) by mouth daily. (Patient taking differently: Take 25 mg by mouth daily as needed (swelling, fluid). ) 30 tablet 5 05/17/2015 at Unknown time  . losartan (COZAAR) 25 MG tablet TAKE 1 TABLET BY MOUTH DAILY 90 tablet 3 05/17/2015 at Unknown time  . Multiple Vitamin (MULTIVITAMIN PO) Take 1 tablet by mouth daily.     05/17/2015 at Unknown time  . ranitidine (ZANTAC) 150 MG capsule Take 150 mg by mouth daily as needed for heartburn.    unknown  . rosuvastatin (CRESTOR) 20 MG tablet TAKE 1 TABLET BY MOUTH EVERY DAY (Patient taking differently: Take 20 mg by mouth at bedtime. ) 30 tablet 11 05/17/2015 at Unknown time  . tamsulosin (FLOMAX) 0.4 MG CAPS capsule Take 0.4 mg by mouth daily after supper.   05/17/2015 at Unknown time  . vitamin E 400 UNIT capsule Take 400 Units by mouth daily.     05/17/2015 at Unknown time  . nitroGLYCERIN (NITROSTAT) 0.4 MG SL tablet Place 1 tablet (0.4 mg total) under the tongue every 5 (five) minutes as needed for chest pain. (  Patient taking differently: Place 0.4 mg under the tongue as needed for chest pain. ) 25 tablet 3 unknown    Assessment: 74 yo man to start heparin for NSTEMI.  He received one dose of lovenox 1mg /kg at 00:45.  Plan to start heparin and transfer to [facility] for cath.  Goal of Therapy:  Heparin level 0.3-0.7 units/ml Monitor platelets by anticoagulation protocol: Yes   Plan:  Heparin drip at 1400 unit/hr. Check heparin level 6 hours after start Daily HL and CBC while on heparin Monitor for bleeding complications  Seay, Lora Poteet 05/19/2015,9:12 AM

## 2015-05-19 NOTE — Interval H&P Note (Signed)
Cath Lab Visit (complete for each Cath Lab visit)  Clinical Evaluation Leading to the Procedure:   ACS: Yes.    Non-ACS:    Anginal Classification: CCS IV  Anti-ischemic medical therapy: Maximal Therapy (2 or more classes of medications)  Non-Invasive Test Results: No non-invasive testing performed  Prior CABG: No previous CABG      History and Physical Interval Note:  05/19/2015 2:47 PM  Hector Torres  has presented today for surgery, with the diagnosis of cp  The various methods of treatment have been discussed with the patient and family. After consideration of risks, benefits and other options for treatment, the patient has consented to  Procedure(s): Left Heart Cath and Coronary Angiography (N/A) as a surgical intervention .  The patient's history has been reviewed, patient examined, no change in status, stable for surgery.  I have reviewed the patient's chart and labs.  Questions were answered to the patient's satisfaction.     KELLY,THOMAS A

## 2015-05-19 NOTE — Progress Notes (Signed)
Pt tranported to [facility]  via [place].  Report given to [facility] in route at 9:57am.

## 2015-05-19 NOTE — H&P (View-Only) (Signed)
Patient ID: Hector Torres, male   DOB: [birth year], 74 y.o.   MRN: [id number]     Subjective:    Chest pain on and off overnight  Objective:   Temp:  [97.8 F (36.6 C)-98.5 F (36.9 C)] 98.2 F (36.8 C) (09/30 0616) Pulse Rate:  [61-123] 63 (09/30 0616) Resp:  [13-20] 16 (09/30 0616) BP: (81-193)/(57-96) 171/75 mmHg (09/30 0616) SpO2:  [95 %-100 %] 96 % (09/30 0616) Weight:  [205 lb (92.987 kg)-210 lb (95.255 kg)] 210 lb (95.255 kg) (09/29 1700) Last BM Date: 05/17/15  Filed Weights   05/18/15 1216 05/18/15 1700  Weight: 205 lb (92.987 kg) 210 lb (95.255 kg)   No intake or output data in the 24 hours ending 05/19/15 0843   Exam:  General: NAD  Resp: CTAB  Cardiac: RRR, no m/r/g, no jvd  GI: abdomen soft, NT, ND  MSK: no LE edema  Neuro: no focal deficits  Psych: appropriate affect  Lab Results:  Basic Metabolic Panel:  Recent Labs Lab 05/18/15 1220 05/18/15 1250  NA 142 142  K 3.7 3.7  CL 102 101  CO2 31  --   GLUCOSE 118* 116*  BUN 25* 25*  CREATININE 1.28* 1.30*  CALCIUM 8.9  --   MG 1.8  --     Liver Function Tests: No results for input(s): AST, ALT, ALKPHOS, BILITOT, PROT, ALBUMIN in the last 168 hours.  CBC:  Recent Labs Lab 05/18/15 1220 05/18/15 1250  WBC 8.0  --   HGB 17.3* 18.0*  HCT 50.2 53.0*  MCV 98.0  --   PLT 172  --     Cardiac Enzymes:  Recent Labs Lab 05/18/15 1700 05/18/15 2240 05/19/15 0521  TROPONINI 0.17* 0.57* 0.54*    BNP: No results for input(s): PROBNP in the last 8760 hours.  Coagulation: No results for input(s): INR in the last 168 hours.  ECG:   Medications:   Scheduled Medications: . aspirin  81 mg Oral Daily  . carvedilol  6.25 mg Oral BID WC  . finasteride  5 mg Oral Daily  . hydrochlorothiazide  25 mg Oral Daily  . Influenza vac split quadrivalent PF  0.5 mL Intramuscular Tomorrow-1000  . losartan  25 mg Oral Daily  . pantoprazole  40 mg Oral BID AC  . pneumococcal 23 valent vaccine   0.5 mL Intramuscular Tomorrow-1000  . rosuvastatin  20 mg Oral QHS  . sodium chloride  3 mL Intravenous Q12H  . sodium chloride  3 mL Intravenous Q12H  . tamsulosin  0.4 mg Oral QPC supper     Infusions:     PRN Medications:  sodium chloride, acetaminophen **OR** acetaminophen, gi cocktail, nitroGLYCERIN, ondansetron **OR** ondansetron (ZOFRAN) IV, oxyCODONE, sodium chloride     Assessment/Plan    1. NSTEMI - long history of chest pain, cath 01/2015 without significant obstructive disease. Did have a small OM1 with 75% but small vessel and overall low risk, no intervention was indicated at that time - episode yesterday far more intense than prior episodes.  - chronic ST/T changes on EKG. Trop initially negative, has trended up to 0.57 and now trending down.  - despite normal cath 01/2015, increased severity in symptoms and + troponin suggests new occlusive disease, possibly ruptured plaque. Will start heparin gtt, start IVF for borderline renal function and plan for transfer to [facility] for cath   2. HTN - elevated this AM, his is due to get his AM meds. Will also given nitropaste, follow  bp.       Carlyle Dolly, M.D.

## 2015-05-20 DIAGNOSIS — I209 Angina pectoris, unspecified: Secondary | ICD-10-CM

## 2015-05-20 LAB — BASIC METABOLIC PANEL
Anion gap: 7 (ref 5–15)
BUN: 12 mg/dL (ref 6–20)
CALCIUM: 8.7 mg/dL — AB (ref 8.9–10.3)
CHLORIDE: 103 mmol/L (ref 101–111)
CO2: 29 mmol/L (ref 22–32)
CREATININE: 1.2 mg/dL (ref 0.61–1.24)
GFR calc Af Amer: >60 mL/min (ref >60)
GFR calc non Af Amer: 57 mL/min — ABNORMAL LOW (ref >60)
GLUCOSE: 92 mg/dL (ref 65–99)
Potassium: 4.1 mmol/L (ref 3.5–5.1)
Sodium: 139 mmol/L (ref 135–145)

## 2015-05-20 LAB — CBC
HCT: 46.9 % (ref 39.0–52.0)
Hemoglobin: 15.8 g/dL (ref 13.0–17.0)
MCH: 33.1 pg (ref 26.0–34.0)
MCHC: 33.7 g/dL (ref 30.0–36.0)
MCV: 98.1 fL (ref 78.0–100.0)
PLATELETS: 149 10*3/uL — AB (ref 150–400)
RBC: 4.78 MIL/uL (ref 4.22–5.81)
RDW: 12.6 % (ref 11.5–15.5)
WBC: 7.9 10*3/uL (ref 4.0–10.5)

## 2015-05-20 MED ORDER — CARVEDILOL 6.25 MG PO TABS: 6.2500 mg | ORAL_TABLET | Freq: Two times a day (BID) | ORAL | Status: DC

## 2015-05-20 NOTE — Progress Notes (Addendum)
Patient ID: Hector Torres, male   DOB: September 19, 1940, 74 y.o.   MRN: [id number]     Subjective:    Feeling much improved overnight with only one episode of mild fast resolving chest pain.  Objective:   Temp:  [97.4 F (36.3 C)-98.2 F (36.8 C)] 97.4 F (36.3 C) (10/01 0524) Pulse Rate:  [0-237] 73 (10/01 0524) Resp:  [0-39] 20 (09/30 2003) BP: (89-250)/(59-120) 172/90 mmHg (10/01 0524) SpO2:  [0 %-100 %] 95 % (10/01 0524) Weight:  [203 lb 6.4 oz (92.262 kg)-209 lb 1.6 oz (94.847 kg)] 203 lb 6.4 oz (92.262 kg) (10/01 0524) Last BM Date: 05/19/15  Filed Weights   05/18/15 1700 05/19/15 1145 05/20/15 0524  Weight: 210 lb (95.255 kg) 209 lb 1.6 oz (94.847 kg) 203 lb 6.4 oz (92.262 kg)    Intake/Output Summary (Last 24 hours) at 05/20/15 0945 Last data filed at 05/20/15 0836  Gross per 24 hour  Intake    780 ml  Output      0 ml  Net    780 ml     Exam:  General: NAD  Resp: CTAB  Cardiac: RRR, no m/r/g, no jvd  GI: abdomen soft, NT, ND  MSK: no LE edema  Neuro: no focal deficits  Psych: appropriate affect  Lab Results:  Basic Metabolic Panel:  Recent Labs Lab 05/18/15 1220 05/18/15 1250 05/20/15 0610  NA 142 142 139  K 3.7 3.7 4.1  CL 102 101 103  CO2 31  --  29  GLUCOSE 118* 116* 92  BUN 25* 25* 12  CREATININE 1.28* 1.30* 1.20  CALCIUM 8.9  --  8.7*  MG 1.8  --   --     Liver Function Tests: No results for input(s): AST, ALT, ALKPHOS, BILITOT, PROT, ALBUMIN in the last 168 hours.  CBC:  Recent Labs Lab 05/18/15 1220 05/18/15 1250 05/20/15 0610  WBC 8.0  --  7.9  HGB 17.3* 18.0* 15.8  HCT 50.2 53.0* 46.9  MCV 98.0  --  98.1  PLT 172  --  149*    Cardiac Enzymes:  Recent Labs Lab 05/18/15 1700 05/18/15 2240 05/19/15 0521  TROPONINI 0.17* 0.57* 0.54*    BNP: No results for input(s): PROBNP in the last 8760 hours.  Coagulation:  Recent Labs Lab 05/19/15 1416  INR 1.07    ECG:   Medications:   Scheduled Medications: .  aspirin  81 mg Oral Daily  . carvedilol  6.25 mg Oral BID WC  . finasteride  5 mg Oral Daily  . hydrochlorothiazide  25 mg Oral Daily  . losartan  25 mg Oral Daily  . pantoprazole  40 mg Oral BID AC  . rosuvastatin  20 mg Oral QHS  . sodium chloride  3 mL Intravenous Q12H  . sodium chloride  3 mL Intravenous Q12H  . tamsulosin  0.4 mg Oral QPC supper    Infusions:    PRN Medications: sodium chloride, sodium chloride, acetaminophen, gi cocktail, nitroGLYCERIN, ondansetron (ZOFRAN) IV, ondansetron **OR** [DISCONTINUED] ondansetron (ZOFRAN) IV, oxyCODONE, sodium chloride   05/19/15 Cardiac cath  1st Diag lesion, 50% stenosed.  Prox LAD lesion, 20% stenosed.  Prox Cx lesion, 30% stenosed.  Mid Cx lesion, 20% stenosed.  Dist Cx lesion, 20% stenosed.  The left ventricular systolic function is normal.  Hyperdynamic LV function with an ejection fraction of 70-75% with evidence for left ventricular hypertrophy without focal segmental wall motion abnormalities.  No significant coronary obstructive disease with mild 20% luminal  narrowing in the proximal LAD, 50% ostial stenosis in a diminutive first diagonal branch; scattered smooth 30% proximal, 20% percent mid, and 20% distal left circumflex narrowing in a dominant left circumflex vessel and a small nondominant RCA.  RECOMMENDATION: Medical therapy.  Assessment/Plan    1. NSTEMI - long history of chest pain, cath 01/2015 without significant obstructive disease. Did have a small OM1 with 75% but small vessel and overall low risk, no intervention was indicated at that time - episode yesterday far more intense than prior episodes.  - chronic ST/T changes on EKG. Trop initially negative, has trended up to 0.57 and now trending down.  -Cath done yesterday shows disease but plan for medical management.  Johnhenry Tippin adjust coreg dose to 6.25 mg.   2. HTN - elevated this AM, his is due to get his AM meds. Kadesha Virrueta also given nitropaste, follow  bp.       Allegra Lai, M.D. 05/20/2015 9:51 AM

## 2015-05-20 NOTE — Progress Notes (Deleted)
Cath yesterday with no acute lesion.  Plan for medical management.  Cardiology to see over the weekend as needed.  Belinda Bringhurst 05/20/2015 9:20 AM

## 2015-05-20 NOTE — Discharge Summary (Signed)
Physician Discharge Summary    Cardiologist: Domenic Polite  Patient ID: Hector Torres MRN: [id number] DOB/AGE: [birth year] 74 y.o.  Admit date: 05/18/2015 Discharge date: 05/20/2015  Admission Diagnoses:  Chest pain  Discharge Diagnoses:  Principal Problem:   Chest pain Active Problems:   Coronary atherosclerosis of native coronary artery   HTN (hypertension)   HLD (hyperlipidemia)   BPH (benign prostatic hypertrophy)   Tachycardia   NSTEMI (non-ST elevated myocardial infarction)   Discharged Condition: stable  Hospital Course:   Hector Torres is a 74 y.o.male history of carotid artery disease, PAD, CAD with prior PTCA to LCX in 1995 at [place], HTN, HL admitted with chest pain. From Dr Hector Torres clinic note on 01/2015, he has had a long history of chest pain. He had an MPI in 10/2013 that showed mild anteroseptal ischemia, recent cath 01/2015 showed nonobstructive disease, worst lesion was 75% stenosis of a very small OM1.   Reports episode of pain starting around 10AM while playing golf. Symptoms similar to what he has been experiencing over the last several months. Reports midchest/epigastric pressure initialyl 2/10 but increased to 10/10. No SOB or palpitaitons, did feel lightheaded with some blurry vision. Not positional, no relation to food. Symptoms lasted approx 4 hours continously.  01/2015 cath: diffuse nonobstructive CAD 10/2013 MPI mild anteroseptal ischemia 03/2011 echo: LVEF 87-56%, grade I diastolic dysfunction, hypokinesis inferior, inferolateral, and apical walls.  EKG junctional tachycardia, diffuse ST depressions which are chronic D-dimer 0.73, Hgb 17.3, Plt 172, Cr 1.28, BUN 25,  CT PE negative   Patient was admitted for observation. Aspirin, losartan, Crestor continued. Coreg was increased to 6.25 mg twice a day.  He was started on a PPI.  Potassium, magnesium, TSH were all okay.  Patient converted from a junctional tachycardia to normal sinus rhythm. His symptoms of chest  pain and lightheadedness resolved.  Troponin trended up to 0.57. He was started on heparin drip and IV fluids. He was then transferred to [facility] for cardiac cath.  Nitropaste was applied.  He went for left heart catheterization which revealed no significant coronary obstructive disease with mild 20% luminal narrowing in the proximal LAD, 50% ostial stenosis in a diminutive first diagonal branch; scattered smooth 30% proximal, 20% percent mid, and 20% distal left circumflex narrowing in a dominant left circumflex vessel and a small nondominant RCA.   Hyperdynamic LV function with an ejection fraction of 70-75% with evidence for left ventricular hypertrophy without focal segmental wall motion abnormalities.  Medical management planned. The patient was seen by Dr. Curt Torres who felt he was stable for DC home.    Consults: Cardiology  Significant Diagnostic Studies:  Left heart catheterization Conclusion     1st Diag lesion, 50% stenosed.  Prox LAD lesion, 20% stenosed.  Prox Cx lesion, 30% stenosed.  Mid Cx lesion, 20% stenosed.  Dist Cx lesion, 20% stenosed.  The left ventricular systolic function is normal.  Hyperdynamic LV function with an ejection fraction of 70-75% with evidence for left ventricular hypertrophy without focal segmental wall motion abnormalities.  No significant coronary obstructive disease with mild 20% luminal narrowing in the proximal LAD, 50% ostial stenosis in a diminutive first diagonal branch; scattered smooth 30% proximal, 20% percent mid, and 20% distal left circumflex narrowing in a dominant left circumflex vessel and a small nondominant RCA.  RECOMMENDATION: Medical therapy.   Diagnostic Diagram           Treatments: See above  Discharge Exam: Blood pressure 172/90, pulse 73, temperature 97.4  F (36.3 C), temperature source Oral, resp. rate 20, height 5\' 10"  (1.778 m), weight 203 lb 6.4 oz (92.262 kg), SpO2 95 %.   Disposition: 01-Home or  Self Care      Discharge Instructions    Diet - low sodium heart healthy    Complete by:  As directed      Discharge instructions    Complete by:  As directed   No lifting with right arm for 3 days     Increase activity slowly    Complete by:  As directed             Medication List    TAKE these medications        aspirin 81 MG tablet  Take 81 mg by mouth daily.     carvedilol 6.25 MG tablet  Commonly known as:  COREG  Take 1 tablet (6.25 mg total) by mouth 2 (two) times daily with a meal.     diphenhydramine-acetaminophen 25-500 MG Tabs tablet  Commonly known as:  TYLENOL PM  Take 2 tablets by mouth at bedtime as needed (sleep/pain).     finasteride 5 MG tablet  Commonly known as:  PROSCAR  Take 1 tablet by mouth daily.     hydrochlorothiazide 25 MG tablet  Commonly known as:  HYDRODIURIL  Take 1 tablet (25 mg total) by mouth daily.     losartan 25 MG tablet  Commonly known as:  COZAAR  TAKE 1 TABLET BY MOUTH DAILY     MULTIVITAMIN PO  Take 1 tablet by mouth daily.     nitroGLYCERIN 0.4 MG SL tablet  Commonly known as:  NITROSTAT  Place 1 tablet (0.4 mg total) under the tongue every 5 (five) minutes as needed for chest pain.     ranitidine 150 MG capsule  Commonly known as:  ZANTAC  Take 150 mg by mouth daily as needed for heartburn.     rosuvastatin 20 MG tablet  Commonly known as:  CRESTOR  TAKE 1 TABLET BY MOUTH EVERY DAY     tamsulosin 0.4 MG Caps capsule  Commonly known as:  FLOMAX  Take 0.4 mg by mouth daily after supper.     Vitamin D 1000 UNITS capsule  Take 5,000 Units by mouth daily.     vitamin E 400 UNIT capsule  Take 400 Units by mouth daily.        Greater than 30 minutes was spent completing the patient's discharge.    SignedTarri Torres, [place] 05/20/2015, 11:46 AM  I have seen and examined this patient with Hector Torres.  Agree with above, note added to reflect my findings.  On exam, RRR, no murmurs, no wheezing.  Chest pain  improved, medications adjusted including increasing coreg.    Hector Torres M. Hector Herbst MD 05/20/2015 1:30 PM

## 2015-06-13 ENCOUNTER — Ambulatory Visit: Payer: Commercial Managed Care - HMO | Admitting: Cardiology

## 2015-06-22 ENCOUNTER — Ambulatory Visit (INDEPENDENT_AMBULATORY_CARE_PROVIDER_SITE_OTHER): Payer: Commercial Managed Care - HMO | Admitting: Cardiology

## 2015-06-22 ENCOUNTER — Encounter: Payer: Self-pay | Admitting: Cardiology

## 2015-06-22 VITALS — BP 122/92 | HR 72 | Ht 70.0 in | Wt 203.0 lb

## 2015-06-22 DIAGNOSIS — I25119 Atherosclerotic heart disease of native coronary artery with unspecified angina pectoris: Secondary | ICD-10-CM | POA: Diagnosis not present

## 2015-06-22 DIAGNOSIS — E785 Hyperlipidemia, unspecified: Secondary | ICD-10-CM | POA: Diagnosis not present

## 2015-06-22 DIAGNOSIS — I10 Essential (primary) hypertension: Secondary | ICD-10-CM

## 2015-06-22 DIAGNOSIS — I6523 Occlusion and stenosis of bilateral carotid arteries: Secondary | ICD-10-CM

## 2015-06-22 MED ORDER — ISOSORBIDE MONONITRATE ER 30 MG PO TB24: 15.0000 mg | ORAL_TABLET | Freq: Every day | ORAL | Status: DC

## 2015-06-22 NOTE — Patient Instructions (Addendum)
   Begin Imdur 15mg  daily (late day) - will have to break 30mg  tablet in half.  New sent to [facility] today.    May go up to whole tab if necessary.  Try the 15mg  for 2-3 weeks first though.   Continue all other medications.   Follow up in  3 months

## 2015-06-22 NOTE — Progress Notes (Signed)
Cardiology Office Note  Date: 06/22/2015   ID: Hector Torres, DOB [birth year], MRN [id number]  PCP: Redge Gainer, MD  Primary Cardiologist: Rozann Lesches, MD   Chief Complaint  Patient presents with  . Hospitalization Follow-up  . Coronary Artery Disease    History of Present Illness: Hector Torres is a 74 y.o. male last seen in June. At that time he was referred for an outpatient cardiac catheterization with symptoms concerning for progressing angina on medical therapy. ECG was performed by Dr. Burt Knack on June 17 demonstrating fairly diffuse nonobstructive CAD with most significant lesion being a 75% very small first diagonal branch. No obvious targets for PCI were found.  More recently he presented to the hospital with recurrent chest pain in October with mild troponin I elevation up to 0.57. He was stabilized medically and underwent a repeat cardiac catheterization which again identified diffuse nonobstructive CAD with no obvious culprits to require revascularization.  He comes in today for a follow-up visit. We discussed his recent cardiac catheterizations. We also went over his medications, he reports compliance and no major intolerances. He still has recurring episodes of chest discomfort, consistent with angina. Nothing as significant as his last event prompting hospitalization.  We did discussed adding a low-dose long-acting nitrate to see if this might be of benefit. Coreg was increased after his hospital stay. Systolic blood pressure does look better today.   Past Medical History  Diagnosis Date  . Carotid artery disease (HCC)     Bilateral - Dr. Bridgett Larsson  . PVD (peripheral vascular disease) ([place])   . TIA (transient ischemic attack)   . Coronary atherosclerosis of native coronary artery     PTCA circumflex at [facility], nonobstructive residual disease at catheterization June and October 2016  . Essential hypertension, benign     Possible white coat  . Mixed hyperlipidemia    . Prostate nodule   . Vitamin D deficiency   . Colon polyp   . BPH (benign prostatic hypertrophy)   . COPD (chronic obstructive pulmonary disease) ([place])   . History of nephrolithiasis   . GERD (gastroesophageal reflux disease)   . Arthritis     Past Surgical History  Procedure Laterality Date  . Eye surgery      Bilateral cataract  . Appendectomy  1947  . Tonsillectomy  1968  . Traumatic injury  1962    Lost tip of index, middle, and ring finger/ right hand  . Angioplasty Left 1995    Cir. Artery  . Cardiac catheterization N/A 02/03/2015    Procedure: Left Heart Cath and Cors/Grafts Angiography;  Surgeon: Sherren Mocha, MD;  Location: [facility];  Service: Cardiovascular;  Laterality: N/A;  . Cardiac catheterization N/A 05/19/2015    Procedure: Left Heart Cath and Coronary Angiography;  Surgeon: Troy Sine, MD;  Location: [facility];  Service: Cardiovascular;  Laterality: N/A;    Current Outpatient Prescriptions  Medication Sig Dispense Refill  . aspirin 81 MG tablet Take 81 mg by mouth daily.    . carvedilol (COREG) 6.25 MG tablet Take 1 tablet (6.25 mg total) by mouth 2 (two) times daily with a meal. 60 tablet 11  . Cholecalciferol (VITAMIN D) 1000 UNITS capsule Take 5,000 Units by mouth daily.     . diphenhydramine-acetaminophen (TYLENOL PM) 25-500 MG TABS tablet Take 2 tablets by mouth at bedtime as needed (sleep/pain).    . finasteride (PROSCAR) 5 MG tablet Take 1 tablet by mouth daily.    Marland Kitchen  hydrochlorothiazide (HYDRODIURIL) 25 MG tablet Take 1 tablet (25 mg total) by mouth daily. (Patient taking differently: Take 25 mg by mouth daily as needed (swelling, fluid). ) 30 tablet 5  . losartan (COZAAR) 25 MG tablet TAKE 1 TABLET BY MOUTH DAILY 90 tablet 3  . Multiple Vitamin (MULTIVITAMIN PO) Take 1 tablet by mouth daily.      . nitroGLYCERIN (NITROSTAT) 0.4 MG SL tablet Place 1 tablet (0.4 mg total) under the tongue every 5 (five) minutes as needed for chest  pain. (Patient taking differently: Place 0.4 mg under the tongue as needed for chest pain. ) 25 tablet 3  . rosuvastatin (CRESTOR) 20 MG tablet TAKE 1 TABLET BY MOUTH EVERY DAY (Patient taking differently: Take 20 mg by mouth at bedtime. ) 30 tablet 11  . tamsulosin (FLOMAX) 0.4 MG CAPS capsule Take 0.4 mg by mouth daily after supper.    . vitamin E 400 UNIT capsule Take 400 Units by mouth daily.      . isosorbide mononitrate (IMDUR) 30 MG 24 hr tablet Take 0.5 tablets (15 mg total) by mouth daily. 30 tablet 6   No current facility-administered medications for this visit.    Allergies:  Review of patient's allergies indicates no known allergies.   Social History: The patient  reports that he quit smoking about 4 years ago. His smoking use included Cigarettes. He has a 90 pack-year smoking history. He has never used smokeless tobacco. He reports that he does not drink alcohol or use illicit drugs.   ROS:  Please see the history of present illness. Otherwise, complete review of systems is positive for none.  All other systems are reviewed and negative.   Physical Exam: VS:  BP 122/92 mmHg  Pulse 72  Ht 5\' 10"  (1.778 m)  Wt 203 lb (92.08 kg)  BMI 29.13 kg/m2  SpO2 93%, BMI Body mass index is 29.13 kg/(m^2).  Wt Readings from Last 3 Encounters:  06/22/15 203 lb (92.08 kg)  05/20/15 203 lb 6.4 oz (92.262 kg)  04/14/15 211 lb (95.709 kg)     General: Patient appears comfortable at rest. HEENT: Conjunctiva and lids normal, oropharynx clear with moist mucosa. Neck: Supple, no elevated JVP or carotid bruits, no thyromegaly. Lungs: Clear to auscultation, nonlabored breathing at rest. Cardiac: Regular rate and rhythm, no S3 or significant systolic murmur, no pericardial rub. Abdomen: Soft, nontender, no hepatomegaly, bowel sounds present, no guarding or rebound. Extremities: No pitting edema, distal pulses 2+. Skin: Warm and dry. Musculoskeletal: No kyphosis. Neuropsychiatric: Alert and  oriented x3, affect grossly appropriate.   ECG: Tracing from 05/19/2015 showed sinus rhythm with left axis and nonspecific ST changes.   Recent Labwork: 07/21/2014: ALT 15; AST 24 05/18/2015: Magnesium 1.8; TSH 1.269 05/20/2015: BUN 12; Creatinine, Ser 1.20; Hemoglobin 15.8; Platelets 149*; Potassium 4.1; Sodium 139   Other Studies Reviewed Today:  Carotid Dopplers 04/14/2015 showed less than 40% RICA stenosis and 92-11% LICA stenosis.   Assessment and Plan:  1. Moderate, multivessel CAD as outlined above. NSTEMI noted in October without obvious culprit lesion, possibly transient plaque rupture event. Plan is to continue medical therapy. Coreg has already been increased, we will add Imdur beginning at 15 mg daily. Office follow-up arranged.  2. Carotid artery disease, less than 40% RICA stenosis and 94-17% LICA stenosis. Recent carotid Dopplers noted. Continue medical therapy.  3. Essential hypertension, blood pressure trend has improved.  4. Hyperlipidemia, continues on statin therapy. Lipids been followed by Dr. Laurance Flatten.  Current medicines  were reviewed with the patient today.  Disposition: FU with me in 3 months.   Signed, Satira Sark, MD, [facility] 06/22/2015 10:25 AM    [facility] [address], [place], [place] [address] Phone: [phone]; Fax: [phone]

## 2015-08-02 ENCOUNTER — Encounter: Payer: Self-pay | Admitting: Family Medicine

## 2015-08-02 ENCOUNTER — Ambulatory Visit (INDEPENDENT_AMBULATORY_CARE_PROVIDER_SITE_OTHER): Payer: Commercial Managed Care - HMO | Admitting: Family Medicine

## 2015-08-02 VITALS — BP 164/85 | HR 65 | Temp 96.8°F | Ht 70.0 in | Wt 207.2 lb

## 2015-08-02 DIAGNOSIS — E785 Hyperlipidemia, unspecified: Secondary | ICD-10-CM | POA: Diagnosis not present

## 2015-08-02 DIAGNOSIS — I10 Essential (primary) hypertension: Secondary | ICD-10-CM | POA: Diagnosis not present

## 2015-08-02 DIAGNOSIS — N400 Enlarged prostate without lower urinary tract symptoms: Secondary | ICD-10-CM | POA: Diagnosis not present

## 2015-08-02 MED ORDER — ROSUVASTATIN CALCIUM 20 MG PO TABS: ORAL_TABLET | ORAL | Status: DC

## 2015-08-02 MED ORDER — PANTOPRAZOLE SODIUM 40 MG PO TBEC: 40.0000 mg | DELAYED_RELEASE_TABLET | Freq: Every day | ORAL | Status: DC

## 2015-08-02 NOTE — Progress Notes (Signed)
   Subjective:    Patient ID: Hector Torres, male    DOB: 1941-08-12, 74 y.o.   MRN: [id number]  HPI 74 year old gentleman who presents today with intermittent chest pain. Since the summertime he has had 7 or 8 episodes of chest pain. He had another cardiac cath September 29 which showed only small 20% lesions in his coronaries. He had further episodes after that on October 25 and November 29. Medicines were adjusted increasing his carvedilol from 3.1 mg to 6.2 mg twice a day and isosorbide was added. He has had GI workup to try to find the source of the pain apparently that has proved negative. The pain does continue the. He is on no PPI.    Review of Systems  Constitutional: Negative.   Respiratory: Positive for chest tightness.   Cardiovascular: Positive for chest pain.  Neurological: Negative.   Psychiatric/Behavioral: Negative.       BP 164/85 mmHg  Pulse 65  Temp(Src) 96.8 F (36 C) (Oral)  Ht '5\' 10"'$  (1.778 m)  Wt 207 lb 4 oz (94.008 kg)  BMI 29.74 kg/m2  Objective:   Physical Exam  Constitutional: He is oriented to person, place, and time. He appears well-developed and well-nourished.  Cardiovascular: Normal rate, regular rhythm and normal heart sounds.   Pulmonary/Chest: Effort normal and breath sounds normal.  Neurological: He is alert and oriented to person, place, and time.          Assessment & Plan:  1. Essential hypertension Blood pressure is slightly elevated today medicines include carvedilol and losartan - CMP14+EGFR  2. BPH (benign prostatic hypertrophy) No current symptoms  3. HLD (hyperlipidemia) We'll check lipids today. Regarding chest pain will try PPI for 2 weeks to see if there is any effect. If no effect consider antispasmodic thinking that maybe the chest pain is related to esophageal spasm - Lipid panel - rosuvastatin (CRESTOR) 20 MG tablet; TAKE 1 TABLET BY MOUTH EVERY DAY  Dispense: 30 tablet; Refill: 11  Wardell Honour MD

## 2015-08-03 LAB — CMP14+EGFR
A/G RATIO: 1.7 (ref 1.1–2.5)
ALK PHOS: 62 IU/L (ref 39–117)
ALT: 16 IU/L (ref 0–44)
AST: 14 IU/L (ref 0–40)
Albumin: 4 g/dL (ref 3.5–4.8)
BILIRUBIN TOTAL: 0.5 mg/dL (ref 0.0–1.2)
BUN/Creatinine Ratio: 21 (ref 10–22)
BUN: 24 mg/dL (ref 8–27)
CALCIUM: 9.2 mg/dL (ref 8.6–10.2)
CHLORIDE: 104 mmol/L (ref 96–106)
CO2: 27 mmol/L (ref 18–29)
Creatinine, Ser: 1.17 mg/dL (ref 0.76–1.27)
GFR calc Af Amer: 70 mL/min/{1.73_m2} (ref >59)
GFR, EST NON AFRICAN AMERICAN: 61 mL/min/{1.73_m2} (ref >59)
Globulin, Total: 2.3 g/dL (ref 1.5–4.5)
Glucose: 92 mg/dL (ref 65–99)
POTASSIUM: 4.8 mmol/L (ref 3.5–5.2)
Sodium: 142 mmol/L (ref 134–144)
Total Protein: 6.3 g/dL (ref 6.0–8.5)

## 2015-08-03 LAB — LIPID PANEL
CHOLESTEROL TOTAL: 133 mg/dL (ref 100–199)
Chol/HDL Ratio: 3 ratio units (ref 0.0–5.0)
HDL: 45 mg/dL (ref >39)
LDL Calculated: 71 mg/dL (ref 0–99)
TRIGLYCERIDES: 83 mg/dL (ref 0–149)
VLDL Cholesterol Cal: 17 mg/dL (ref 5–40)

## 2015-09-06 ENCOUNTER — Other Ambulatory Visit: Payer: Self-pay | Admitting: *Deleted

## 2015-09-06 DIAGNOSIS — E785 Hyperlipidemia, unspecified: Secondary | ICD-10-CM

## 2015-09-06 MED ORDER — ROSUVASTATIN CALCIUM 20 MG PO TABS: ORAL_TABLET | ORAL | Status: DC

## 2015-09-06 MED ORDER — PANTOPRAZOLE SODIUM 40 MG PO TBEC: 40.0000 mg | DELAYED_RELEASE_TABLET | Freq: Every day | ORAL | Status: DC

## 2015-09-06 MED ORDER — CARVEDILOL 6.25 MG PO TABS: 6.2500 mg | ORAL_TABLET | Freq: Two times a day (BID) | ORAL | Status: DC

## 2015-09-11 ENCOUNTER — Telehealth: Payer: Self-pay | Admitting: Family Medicine

## 2015-09-11 NOTE — Telephone Encounter (Signed)
Spoke to patient and he states since he saw you in December he has had 3 occurences of chest pain and the pantoprazole isn't working. Please advise.

## 2015-09-12 NOTE — Telephone Encounter (Signed)
Medicine I gave him is for acid. If he is still having symptoms and the cardiologist feels that the pain is probably not cardiac I would suggest taking the acid suppressant twice a day rather than once a day

## 2015-09-12 NOTE — Telephone Encounter (Signed)
Stp and advised of MD feedback. Pt voiced understanding.

## 2015-09-20 ENCOUNTER — Other Ambulatory Visit: Payer: Self-pay | Admitting: Cardiology

## 2015-09-29 ENCOUNTER — Encounter: Payer: Self-pay | Admitting: Cardiology

## 2015-09-29 ENCOUNTER — Ambulatory Visit (INDEPENDENT_AMBULATORY_CARE_PROVIDER_SITE_OTHER): Payer: Commercial Managed Care - HMO | Admitting: Cardiology

## 2015-09-29 VITALS — BP 158/110 | HR 72 | Ht 70.0 in | Wt 192.0 lb

## 2015-09-29 DIAGNOSIS — I10 Essential (primary) hypertension: Secondary | ICD-10-CM

## 2015-09-29 DIAGNOSIS — G8929 Other chronic pain: Secondary | ICD-10-CM

## 2015-09-29 DIAGNOSIS — R079 Chest pain, unspecified: Secondary | ICD-10-CM | POA: Diagnosis not present

## 2015-09-29 DIAGNOSIS — I25119 Atherosclerotic heart disease of native coronary artery with unspecified angina pectoris: Secondary | ICD-10-CM | POA: Diagnosis not present

## 2015-09-29 DIAGNOSIS — E782 Mixed hyperlipidemia: Secondary | ICD-10-CM

## 2015-09-29 DIAGNOSIS — I6523 Occlusion and stenosis of bilateral carotid arteries: Secondary | ICD-10-CM

## 2015-09-29 MED ORDER — LOSARTAN POTASSIUM 50 MG PO TABS: 50.0000 mg | ORAL_TABLET | Freq: Every day | ORAL | Status: DC

## 2015-09-29 MED ORDER — ISOSORBIDE MONONITRATE ER 30 MG PO TB24: 30.0000 mg | ORAL_TABLET | Freq: Every day | ORAL | Status: DC

## 2015-09-29 NOTE — Patient Instructions (Addendum)
   Continue the Imdur at 30mg  daily.  Increase the Cozaar to 50mg  daily.  New prescriptions sent [facility] today.  Continue all other medications.   Your physician wants you to follow up in: 6 months.  You will receive a reminder letter in the mail one-two months in advance.  If you don't receive a letter, please call our office to schedule the follow up appointment

## 2015-09-29 NOTE — Progress Notes (Signed)
Cardiology Office Note  Date: 09/29/2015   ID: Hector Torres, DOB 1941-07-30, MRN [id number]  PCP: Wardell Honour, MD  Primary Cardiologist: Rozann Lesches, MD   Chief Complaint  Patient presents with  . Coronary Artery Disease    History of Present Illness: Harshal Burr is a 75 y.o. male last seen in November 2016. At the last visit we added low-dose Imdur to his medical regimen for angina control.  He tells me that he still has episodes of chest pain that occur sporadically, sometimes at nighttime when he gets up to go to the bathroom, other times during the daytime hours without any specific precipitant. He has increased his Imdur to 30 mg daily recently.  We discussed his medications which are outlined below.  Cardiac regimen includes aspirin, Cozaar, HCTZ, Imdur, Coreg , Crestor, and as needed nitroglycerin. He has also been placed on Protonix in the interim for possible reflux or esophageal spasm.  I reviewed his most recent cardiac catheterization from September 2016, report outlined below. He had overall mild to moderate diffuse nonobstructive disease. Plan to continue medical therapy adjustments.  Blood pressure elevated again today. Although possible component of whitecoat hypertension, his general trend has not been optimal and with recurring chest pain we discussed further adjustment in his Cozaar as well.  Recent lipid panel demonstrated good LDL control at 71. He reports no problems with statin therapy.   Past Medical History  Diagnosis Date  . Carotid artery disease (HCC)     Bilateral - Dr. Bridgett Larsson  . PVD (peripheral vascular disease) ([place])   . TIA (transient ischemic attack)   . Coronary atherosclerosis of native coronary artery     PTCA circumflex at [facility], nonobstructive residual disease at catheterization June and October 2016  . Essential hypertension, benign     Possible white coat  . Mixed hyperlipidemia   . Prostate nodule   . Vitamin D deficiency    . Colon polyp   . BPH (benign prostatic hypertrophy)   . COPD (chronic obstructive pulmonary disease) ([place])   . History of nephrolithiasis   . GERD (gastroesophageal reflux disease)   . Arthritis     Past Surgical History  Procedure Laterality Date  . Eye surgery      Bilateral cataract  . Appendectomy  1947  . Tonsillectomy  1968  . Traumatic injury  1962    Lost tip of index, middle, and ring finger/ right hand  . Angioplasty Left 1995    Cir. Artery  . Cardiac catheterization N/A 02/03/2015    Procedure: Left Heart Cath and Cors/Grafts Angiography;  Surgeon: Sherren Mocha, MD;  Location: [facility];  Service: Cardiovascular;  Laterality: N/A;  . Cardiac catheterization N/A 05/19/2015    Procedure: Left Heart Cath and Coronary Angiography;  Surgeon: Troy Sine, MD;  Location: [facility];  Service: Cardiovascular;  Laterality: N/A;    Current Outpatient Prescriptions  Medication Sig Dispense Refill  . aspirin 81 MG tablet Take 81 mg by mouth daily.    . carvedilol (COREG) 6.25 MG tablet Take 1 tablet (6.25 mg total) by mouth 2 (two) times daily with a meal. 180 tablet 0  . Cholecalciferol (VITAMIN D) 1000 UNITS capsule Take 5,000 Units by mouth daily.     . diphenhydramine-acetaminophen (TYLENOL PM) 25-500 MG TABS tablet Take 2 tablets by mouth at bedtime as needed (sleep/pain).    . finasteride (PROSCAR) 5 MG tablet Take 1 tablet by mouth daily.    Marland Kitchen  hydrochlorothiazide (HYDRODIURIL) 25 MG tablet Take 1 tablet (25 mg total) by mouth daily. (Patient taking differently: Take 25 mg by mouth daily as needed (swelling, fluid). ) 30 tablet 5  . isosorbide mononitrate (IMDUR) 30 MG 24 hr tablet Take 30 mg by mouth daily.    Marland Kitchen losartan (COZAAR) 25 MG tablet TAKE 1 TABLET BY MOUTH DAILY 90 tablet 3  . Multiple Vitamin (MULTIVITAMIN PO) Take 1 tablet by mouth daily.      Marland Kitchen NITROSTAT 0.4 MG SL tablet PLACE 1 TABLET UNDER THE TONGUE EVERY 5 MINUTES AS NEEDED FOR CHEST  PAIN. 25 tablet 3  . pantoprazole (PROTONIX) 40 MG tablet Take 40 mg by mouth 2 (two) times daily.    . rosuvastatin (CRESTOR) 20 MG tablet TAKE 1 TABLET BY MOUTH EVERY DAY 90 tablet 1  . tamsulosin (FLOMAX) 0.4 MG CAPS capsule Take 0.4 mg by mouth daily after supper.    . vitamin E 400 UNIT capsule Take 400 Units by mouth daily.       No current facility-administered medications for this visit.   Allergies:  Review of patient's allergies indicates no known allergies.   Social History: The patient  reports that he quit smoking about 4 years ago. His smoking use included Cigarettes. He has a 90 pack-year smoking history. He has never used smokeless tobacco. He reports that he does not drink alcohol or use illicit drugs.   ROS:  Please see the history of present illness. Otherwise, complete review of systems is positive for recurring chest pain as outlined above.  No consistent functional limitation, continues to play golf when the weather is good. All other systems are reviewed and negative.   Physical Exam: VS:  BP 158/110 mmHg  Pulse 72  Ht 5\' 10"  (1.778 m)  Wt 192 lb (87.091 kg)  BMI 27.55 kg/m2  SpO2 95%, BMI Body mass index is 27.55 kg/(m^2).  Wt Readings from Last 3 Encounters:  09/29/15 192 lb (87.091 kg)  08/02/15 207 lb 4 oz (94.008 kg)  06/22/15 203 lb (92.08 kg)    General: Patient appears comfortable at rest. HEENT: Conjunctiva and lids normal, oropharynx clear with moist mucosa. Neck: Supple, no elevated JVP or carotid bruits, no thyromegaly. Lungs: Clear to auscultation, nonlabored breathing at rest. Cardiac: Regular rate and rhythm, no S3 or significant systolic murmur, no pericardial rub. Abdomen: Soft, nontender, no hepatomegaly, bowel sounds present, no guarding or rebound. Extremities: No pitting edema, distal pulses 2+. Skin: Warm and dry. Musculoskeletal: No kyphosis. Neuropsychiatric: Alert and oriented x3, affect grossly appropriate.  ECG:  I personally  reviewed the prior tracing from 05/19/2015 which showed normal sinus rhythm with leftward axis and nonspecific ST-T changes.  Recent Labwork: 05/18/2015: Magnesium 1.8; TSH 1.269 05/20/2015: Hemoglobin 15.8; Platelets 149* 08/02/2015: ALT 16; AST 14; BUN 24; Creatinine, Ser 1.17; Potassium 4.8; Sodium 142     Component Value Date/Time   CHOL 133 08/02/2015 1032   CHOL 102 12/25/2012 1251   TRIG 83 08/02/2015 1032   TRIG 82 12/25/2012 1251   HDL 45 08/02/2015 1032   HDL 36* 12/25/2012 1251   CHOLHDL 3.0 08/02/2015 1032   LDLCALC 71 08/02/2015 1032   LDLCALC 50 12/25/2012 1251   Other Studies Reviewed Today:  Cardiac catheterization 05/19/2015:  1st Diag lesion, 50% stenosed.  Prox LAD lesion, 20% stenosed.  Prox Cx lesion, 30% stenosed.  Mid Cx lesion, 20% stenosed.  Dist Cx lesion, 20% stenosed.  The left ventricular systolic function is normal.  Hyperdynamic LV function with an ejection fraction of 70-75% with evidence for left ventricular hypertrophy without focal segmental wall motion abnormalities.  No significant coronary obstructive disease with mild 20% luminal narrowing in the proximal LAD, 50% ostial stenosis in a diminutive first diagonal branch; scattered smooth 30% proximal, 20% percent mid, and 20% distal left circumflex narrowing in a dominant left circumflex vessel and a small nondominant RCA.  Carotid Dopplers 04/14/2015: Less than 40% or ICA stenosis , [id number] LICA stenosis.  Assessment and Plan:  1. Recurrent chest pain , cardiac evaluation has not shown any definite culprit stenosis, and plan is to continue medical therapy. He has been placed on PPI in the interim, we will plan to advance his Imdur to 30 mg daily.  2. Mild to moderate, diffuse nonobstructive CAD, most recently documented at cardiac catheterization in September 2016. Plan to continue medical therapy and observation.  3. Hyperlipidemia, recent LDL 71 on Crestor. He reports no medication  intolerances.  4. Essential hypertension, blood pressure is not well-controlled, higher than at last visit. We will increase Cozaar to 50 mg daily.  5.  Carotid artery disease, moderate LICA stenosis by carotid Dopplers in August 2016. He follows with Dr. Bridgett Larsson.  Current medicines were reviewed with the patient today.  Disposition: FU with me in 6 months.   Signed, Satira Sark, MD, [facility] 09/29/2015 10:21 AM    [facility] at [place], [place], [place] [address] Phone: [phone]; Fax: [phone]

## 2015-10-20 ENCOUNTER — Encounter (HOSPITAL_COMMUNITY): Payer: Commercial Managed Care - HMO

## 2015-10-20 ENCOUNTER — Ambulatory Visit: Payer: Commercial Managed Care - HMO | Admitting: Family

## 2015-11-06 ENCOUNTER — Other Ambulatory Visit: Payer: Self-pay | Admitting: *Deleted

## 2015-11-06 MED ORDER — LOSARTAN POTASSIUM 50 MG PO TABS: 50.0000 mg | ORAL_TABLET | Freq: Every day | ORAL | Status: DC

## 2015-11-06 MED ORDER — ISOSORBIDE MONONITRATE ER 30 MG PO TB24: 30.0000 mg | ORAL_TABLET | Freq: Every day | ORAL | Status: DC

## 2016-02-06 ENCOUNTER — Other Ambulatory Visit: Payer: Self-pay | Admitting: Cardiology

## 2016-02-07 ENCOUNTER — Other Ambulatory Visit: Payer: Self-pay | Admitting: Cardiology

## 2016-02-14 ENCOUNTER — Ambulatory Visit: Payer: Commercial Managed Care - HMO | Admitting: Family Medicine

## 2016-02-24 ENCOUNTER — Other Ambulatory Visit: Payer: Self-pay | Admitting: Cardiology

## 2016-02-28 ENCOUNTER — Ambulatory Visit (INDEPENDENT_AMBULATORY_CARE_PROVIDER_SITE_OTHER): Payer: Commercial Managed Care - HMO | Admitting: Family Medicine

## 2016-02-28 ENCOUNTER — Encounter: Payer: Self-pay | Admitting: Family Medicine

## 2016-02-28 VITALS — BP 94/66 | HR 110 | Temp 96.9°F | Ht 70.0 in | Wt 203.6 lb

## 2016-02-28 DIAGNOSIS — I10 Essential (primary) hypertension: Secondary | ICD-10-CM

## 2016-02-28 DIAGNOSIS — E782 Mixed hyperlipidemia: Secondary | ICD-10-CM

## 2016-02-28 DIAGNOSIS — I200 Unstable angina: Secondary | ICD-10-CM | POA: Diagnosis not present

## 2016-02-28 LAB — LIPID PANEL
CHOLESTEROL TOTAL: 103 mg/dL (ref 100–199)
Chol/HDL Ratio: 2.3 ratio units (ref 0.0–5.0)
HDL: 44 mg/dL (ref >39)
LDL CALC: 44 mg/dL (ref 0–99)
TRIGLYCERIDES: 75 mg/dL (ref 0–149)
VLDL CHOLESTEROL CAL: 15 mg/dL (ref 5–40)

## 2016-02-28 LAB — CMP14+EGFR
A/G RATIO: 1.8 (ref 1.2–2.2)
ALBUMIN: 3.9 g/dL (ref 3.5–4.8)
ALT: 16 IU/L (ref 0–44)
AST: 23 IU/L (ref 0–40)
Alkaline Phosphatase: 58 IU/L (ref 39–117)
BUN/Creatinine Ratio: 12 (ref 10–24)
BUN: 15 mg/dL (ref 8–27)
Bilirubin Total: 0.6 mg/dL (ref 0.0–1.2)
CALCIUM: 8.9 mg/dL (ref 8.6–10.2)
CO2: 29 mmol/L (ref 18–29)
CREATININE: 1.25 mg/dL (ref 0.76–1.27)
Chloride: 102 mmol/L (ref 96–106)
GFR calc Af Amer: 64 mL/min/{1.73_m2} (ref >59)
GFR, EST NON AFRICAN AMERICAN: 56 mL/min/{1.73_m2} — AB (ref >59)
GLOBULIN, TOTAL: 2.2 g/dL (ref 1.5–4.5)
Glucose: 99 mg/dL (ref 65–99)
Potassium: 3.9 mmol/L (ref 3.5–5.2)
SODIUM: 144 mmol/L (ref 134–144)
TOTAL PROTEIN: 6.1 g/dL (ref 6.0–8.5)

## 2016-02-28 MED ORDER — CARVEDILOL 6.25 MG PO TABS: 6.2500 mg | ORAL_TABLET | Freq: Two times a day (BID) | ORAL | Status: DC

## 2016-02-28 MED ORDER — AMLODIPINE BESYLATE 5 MG PO TABS: ORAL_TABLET | ORAL | Status: DC

## 2016-02-28 MED ORDER — HYOSCYAMINE SULFATE 0.125 MG SL SUBL: 0.1250 mg | SUBLINGUAL_TABLET | SUBLINGUAL | Status: DC | PRN

## 2016-02-28 NOTE — Progress Notes (Signed)
Subjective:    Patient ID: Hector Torres, male    DOB: 27-Apr-1941, 75 y.o.   MRN: [id number]  HPI Patient is here today for a follow up on hypertension and hyperlipidemia. Patient also states that his chest pain that he has had for the last 2 years is getting worse. He has had multiple evaluations for chest pain. He had formally been given a PPI but has since stopped since it did not help the pain. The pain comes on out of the blue and is not related to exertion. He asked me today about Prinzmetal angina. That could be a possibility but I think we would see some EKG changes when he is having the discomfort; that has not been the case.  Blood pressure today blood pressure today is low at 94/66. He tells me it has never been a slow. He is on several medicines which could influence blood pressure including isosorbide, losartan, carvedilol, and hydrochlorothiazide.   Review of Systems  Constitutional: Negative.   HENT: Negative.   Eyes: Negative.   Respiratory: Negative.   Cardiovascular: Positive for chest pain.  Gastrointestinal: Negative.   Endocrine: Negative.   Genitourinary: Negative.   Musculoskeletal: Negative.   Skin: Negative.   Allergic/Immunologic: Negative.   Neurological: Negative.   Hematological: Negative.   Psychiatric/Behavioral: Negative.    Depression screen [facility] 2/9 02/28/2016 07/21/2014  Decreased Interest 0 0  Down, Depressed, Hopeless 0 0  PHQ - 2 Score 0 0              Patient Active Problem List   Diagnosis Date Noted  . NSTEMI (non-ST elevated myocardial infarction) ([place])   . Chest pain 05/18/2015  . Tachycardia 05/18/2015  . Dizziness and giddiness 04/14/2015  . Vision, loss, sudden 04/14/2015  . Ischemic chest pain ([place]) 02/03/2015  . Accelerating angina ([place])   . Kidney stone   . BPH (benign prostatic hypertrophy)   . Other chest pain 10/14/2014  . Chest pressure 10/14/2014  . TIA (transient ischemic attack)   . Prostate nodule   . PVD  (peripheral vascular disease) ([place])   . Vitamin D deficiency   . HTN (hypertension) 12/25/2012  . HLD (hyperlipidemia) 12/25/2012  . Screening for prostate cancer 12/25/2012  . Occlusion and stenosis of carotid artery without mention of cerebral infarction 04/03/2012  . Coronary atherosclerosis of native coronary artery 04/15/2011  . Mixed hyperlipidemia 04/15/2011   Outpatient Encounter Prescriptions as of 02/28/2016  Medication Sig  . aspirin 81 MG tablet Take 81 mg by mouth daily.  . carvedilol (COREG) 6.25 MG tablet Take 1 tablet (6.25 mg total) by mouth 2 (two) times daily with a meal.  . Cholecalciferol (VITAMIN D) 1000 UNITS capsule Take 5,000 Units by mouth daily.   . diphenhydramine-acetaminophen (TYLENOL PM) 25-500 MG TABS tablet Take 2 tablets by mouth at bedtime as needed (sleep/pain).  . finasteride (PROSCAR) 5 MG tablet Take 1 tablet by mouth daily.  . hydrochlorothiazide (HYDRODIURIL) 25 MG tablet Take 1 tablet (25 mg total) by mouth daily. (Patient taking differently: Take 25 mg by mouth daily as needed (swelling, fluid). )  . isosorbide mononitrate (IMDUR) 30 MG 24 hr tablet Take 1 tablet (30 mg total) by mouth daily.  Marland Kitchen losartan (COZAAR) 50 MG tablet Take 1 tablet (50 mg total) by mouth daily.  . Multiple Vitamin (MULTIVITAMIN PO) Take 1 tablet by mouth daily.    Marland Kitchen NITROSTAT 0.4 MG SL tablet PLACE 1 TABLET UNDER THE TONGUE EVERY 5 MINUTES AS  NEEDED FOR CHEST PAIN.  . rosuvastatin (CRESTOR) 20 MG tablet TAKE 1 TABLET BY MOUTH EVERY DAY  . tamsulosin (FLOMAX) 0.4 MG CAPS capsule Take 0.4 mg by mouth daily after supper.  . vitamin E 400 UNIT capsule Take 400 Units by mouth daily.    . [DISCONTINUED] losartan (COZAAR) 50 MG tablet TAKE 1 TABLET (50 MG TOTAL) BY MOUTH DAILY.  . [DISCONTINUED] pantoprazole (PROTONIX) 40 MG tablet Take 40 mg by mouth 2 (two) times daily. Reported on 02/28/2016   No facility-administered encounter medications on file as of 02/28/2016.         Objective:   Physical Exam  Constitutional: He appears well-developed and well-nourished.  Cardiovascular: Normal rate, regular rhythm and normal heart sounds.   Pulmonary/Chest: Effort normal and breath sounds normal.  Abdominal: Soft.    BP 94/66 mmHg  Pulse 110  Temp(Src) 96.9 F (36.1 C) (Oral)  Ht '5\' 10"'  (1.778 m)  Wt 203 lb 9.6 oz (92.352 kg)  BMI 29.21 kg/m2       Assessment & Plan:  1. Essential hypertension Blood pressure is too low. I don't know if this is a typical reading. It seems not to be typical. I think he needs more pressure to perfuse his arteries. We talked first about reducing the losartan but since he is talking about Prinzmetal angina and has a friend is been on calcium.blocker that helped I think we'll begin low-dose amlodipine but stop the losartan for now - CMP14+EGFR  2. Mixed hyperlipidemia He has concerns about taking generic Crestor and asked that we recheck lipids today - Lipid panel  3. Accelerating angina ([place]) Asked pain may be coronary related but I would also consider irritable bowel syndrome affecting the esophagus. Toward that in I have given him a small prescription for Levsin to be used sublingual when he has chest pain. We'll see him back in the next month to evaluate this treatment  Wardell Honour MD

## 2016-03-11 DIAGNOSIS — H35371 Puckering of macula, right eye: Secondary | ICD-10-CM | POA: Diagnosis not present

## 2016-03-11 DIAGNOSIS — Z961 Presence of intraocular lens: Secondary | ICD-10-CM | POA: Diagnosis not present

## 2016-03-11 DIAGNOSIS — H31003 Unspecified chorioretinal scars, bilateral: Secondary | ICD-10-CM | POA: Diagnosis not present

## 2016-03-28 DIAGNOSIS — N451 Epididymitis: Secondary | ICD-10-CM | POA: Diagnosis not present

## 2016-03-28 DIAGNOSIS — R351 Nocturia: Secondary | ICD-10-CM | POA: Diagnosis not present

## 2016-03-28 DIAGNOSIS — N452 Orchitis: Secondary | ICD-10-CM | POA: Diagnosis not present

## 2016-04-04 NOTE — Progress Notes (Signed)
Cardiology Office Note  Date: 04/05/2016   ID: Hector Torres, DOB [birth year], MRN [id number]  PCP: Hector Honour, MD  Primary Cardiologist: Hector Lesches, MD   Chief Complaint  Patient presents with  . Coronary Artery Disease    History of Present Illness: Hector Torres is a 75 y.o. male last seen in February. At the last visit Imdur was increased to 30 mg daily and he has been continued on medical therapy for treatment of angina in the setting of mild to moderate diffuse nonobstructive CAD by cardiac catheterization in September of last year.   He was seen by Dr. Sabra Torres recently in July reporting some chest discomfort at that time that was felt to be potentially GI in etiology. He was placed on Levsin stating that PPI had not been very effective. He has only had one spell since that time, thinks that the [place] may have helped.  He reports having fluctuations in blood pressure from low to high. He now has a blood pressure cuff at home. It does look like his blood pressure is lower in the left arm than the right arm, asked him to check in the right arm more consistently and record the results for when he sees Dr. Sabra Torres next week. It may be that he needs to have some of his medication cut back potentially.  I reviewed his ECG today which shows sinus rhythm with decreased R wave progression and nonspecific ST changes. He does not report any reproducible exertional chest pain. States that he just got [place] the golf [place] today.  Past Medical History:  Diagnosis Date  . Arthritis   . BPH (benign prostatic hypertrophy)   . Carotid artery disease (HCC)    Bilateral - Dr. Bridgett Torres  . Colon polyp   . COPD (chronic obstructive pulmonary disease) ([place])   . Coronary atherosclerosis of native coronary artery    [facility] circumflex at [facility], nonobstructive residual disease at catheterization June and October 2016  . Essential hypertension, benign    Possible white coat  . GERD  (gastroesophageal reflux disease)   . History of nephrolithiasis   . Mixed hyperlipidemia   . Prostate nodule   . PVD (peripheral vascular disease) ([place])   . TIA (transient ischemic attack)   . Vitamin D deficiency     Past Surgical History:  Procedure Laterality Date  . ANGIOPLASTY Left 1995   Cir. Artery  . APPENDECTOMY  1947  . CARDIAC CATHETERIZATION N/A 02/03/2015   Procedure: Left Heart Cath and Cors/Grafts Angiography;  Surgeon: Sherren Mocha, MD;  Location: [facility];  Service: Cardiovascular;  Laterality: N/A;  . CARDIAC CATHETERIZATION N/A 05/19/2015   Procedure: Left Heart Cath and Coronary Angiography;  Surgeon: Troy Sine, MD;  Location: [facility];  Service: Cardiovascular;  Laterality: N/A;  . EYE SURGERY     Bilateral cataract  . TONSILLECTOMY  1968  . Traumatic injury  1962   Lost tip of index, middle, and ring finger/ right hand    Current Outpatient Prescriptions  Medication Sig Dispense Refill  . amLODipine (NORVASC) 5 MG tablet Take 1/2 daily 30 tablet 1  . aspirin 81 MG tablet Take 81 mg by mouth daily.    . carvedilol (COREG) 6.25 MG tablet Take 1 tablet (6.25 mg total) by mouth 2 (two) times daily with a meal. 180 tablet 0  . Cholecalciferol (VITAMIN D) 1000 UNITS capsule Take 5,000 Units by mouth daily.     Marland Kitchen  diphenhydramine-acetaminophen (TYLENOL PM) 25-500 MG TABS tablet Take 2 tablets by mouth at bedtime as needed (sleep/pain).    . finasteride (PROSCAR) 5 MG tablet Take 1 tablet by mouth daily.    . hydrochlorothiazide (HYDRODIURIL) 25 MG tablet Take 1 tablet (25 mg total) by mouth daily. (Patient taking differently: Take 25 mg by mouth daily as needed (swelling, fluid). ) 30 tablet 5  . hyoscyamine (LEVSIN/SL) 0.125 MG SL tablet Place 1 tablet (0.125 mg total) under the tongue every 4 (four) hours as needed. For chest pain 20 tablet 0  . isosorbide mononitrate (IMDUR) 30 MG 24 hr tablet Take 1 tablet (30 mg total) by mouth daily. 90  tablet 3  . losartan (COZAAR) 50 MG tablet Take 1 tablet (50 mg total) by mouth daily. 90 tablet 3  . Multiple Vitamin (MULTIVITAMIN PO) Take 1 tablet by mouth daily.      Marland Kitchen NITROSTAT 0.4 MG SL tablet PLACE 1 TABLET UNDER THE TONGUE EVERY 5 MINUTES AS NEEDED FOR CHEST PAIN. 25 tablet 3  . rosuvastatin (CRESTOR) 20 MG tablet TAKE 1 TABLET BY MOUTH EVERY DAY 90 tablet 1  . tamsulosin (FLOMAX) 0.4 MG CAPS capsule Take 0.4 mg by mouth daily after supper.    . vitamin E 400 UNIT capsule Take 400 Units by mouth daily.       No current facility-administered medications for this visit.    Allergies:  Review of patient's allergies indicates no known allergies.   Social History: The patient  reports that he quit smoking about 5 years ago. His smoking use included Cigarettes. He has a 90.00 pack-year smoking history. He has never used smokeless tobacco. He reports that he does not drink alcohol or use drugs.   ROS:  Please see the history of present illness. Otherwise, complete review of systems is positive for arthritis symptoms.  All other systems are reviewed and negative.   Physical Exam: VS:  BP (!) 108/52   Pulse 74   Ht 5\' 10"  (1.778 m)   Wt 199 lb 9.6 oz (90.5 kg)   SpO2 99%   BMI 28.64 kg/m , BMI Body mass index is 28.64 kg/m.  Wt Readings from Last 3 Encounters:  04/05/16 199 lb 9.6 oz (90.5 kg)  02/28/16 203 lb 9.6 oz (92.4 kg)  09/29/15 192 lb (87.1 kg)    General: Patient appears comfortable at rest. HEENT: Conjunctiva and lids normal, oropharynx clear with moist mucosa. Neck: Supple, no elevated JVP or carotid bruits, no thyromegaly. Lungs: Clear to auscultation, nonlabored breathing at rest. Cardiac: Regular rate and rhythm, no S3 or significant systolic murmur, no pericardial rub. Abdomen: Soft, nontender, no hepatomegaly, bowel sounds present, no guarding or rebound. Extremities: No pitting edema, distal pulses 2+. Skin: Warm and dry. Musculoskeletal: No  kyphosis. Neuropsychiatric: Alert and oriented x3, affect grossly appropriate.  ECG: I personally reviewed the tracing from 05/19/2015 which showed normal sinus rhythm with leftward axis and nonspecific ST-T changes.  Recent Labwork: 05/18/2015: Magnesium 1.8; TSH 1.269 05/20/2015: Hemoglobin 15.8; Platelets 149 02/28/2016: ALT 16; AST 23; BUN 15; Creatinine, Ser 1.25; Potassium 3.9; Sodium 144     Component Value Date/Time   CHOL 103 02/28/2016 0847   CHOL 102 12/25/2012 1251   TRIG 75 02/28/2016 0847   TRIG 82 12/25/2012 1251   HDL 44 02/28/2016 0847   HDL 36 (L) 12/25/2012 1251   CHOLHDL 2.3 02/28/2016 0847   LDLCALC 44 02/28/2016 0847   LDLCALC 50 12/25/2012 1251  Other Studies Reviewed Today:  Cardiac catheterization 05/19/2015:  1st Diag lesion, 50% stenosed.  Prox LAD lesion, 20% stenosed.  Prox Cx lesion, 30% stenosed.  Mid Cx lesion, 20% stenosed.  Dist Cx lesion, 20% stenosed.  The left ventricular systolic function is normal.  Hyperdynamic LV function with an ejection fraction of 70-75% with evidence for left ventricular hypertrophy without focal segmental wall motion abnormalities.  No significant coronary obstructive disease with mild 20% luminal narrowing in the proximal LAD, 50% ostial stenosis in a diminutive first diagonal branch; scattered smooth 30% proximal, 20% percent mid, and 20% distal left circumflex narrowing in a dominant left circumflex vessel and a small nondominant RCA.  Assessment and Plan:  1. Mild to moderate diffuse CAD. We will continue medical therapy for potential angina symptoms, although not clear that all of his chest pain is related and could be GI in etiology. For now we will continue the current regimen. ECG reviewed and stable.  2. Fluctuations in blood pressure. I asked him to keep a blood pressure log using his blood pressure cuff on the right arm. He will be seeing Dr. Sabra Torres next week.  3. Hyperlipidemia, on Crestor with  recent LDL 44.  4. GERD and possible esophageal spasm. He has tried PPI, now has Levsin to use as needed.  Current medicines were reviewed with the patient today.   Orders Placed This Encounter  Procedures  . EKG 12-Lead    Disposition: Follow-up with me in 6 months.  Signed, Satira Sark, MD, [facility] 04/05/2016 1:50 PM    [facility] [place], [place], [place] [address] Phone: [phone]; Fax: [phone]

## 2016-04-05 ENCOUNTER — Encounter: Payer: Self-pay | Admitting: Cardiology

## 2016-04-05 ENCOUNTER — Ambulatory Visit (INDEPENDENT_AMBULATORY_CARE_PROVIDER_SITE_OTHER): Payer: Commercial Managed Care - HMO | Admitting: Cardiology

## 2016-04-05 VITALS — BP 108/52 | HR 74 | Ht 70.0 in | Wt 199.6 lb

## 2016-04-05 DIAGNOSIS — K219 Gastro-esophageal reflux disease without esophagitis: Secondary | ICD-10-CM

## 2016-04-05 DIAGNOSIS — I10 Essential (primary) hypertension: Secondary | ICD-10-CM | POA: Diagnosis not present

## 2016-04-05 DIAGNOSIS — E782 Mixed hyperlipidemia: Secondary | ICD-10-CM

## 2016-04-05 DIAGNOSIS — I25119 Atherosclerotic heart disease of native coronary artery with unspecified angina pectoris: Secondary | ICD-10-CM | POA: Diagnosis not present

## 2016-04-05 NOTE — Patient Instructions (Signed)

## 2016-04-10 ENCOUNTER — Ambulatory Visit (INDEPENDENT_AMBULATORY_CARE_PROVIDER_SITE_OTHER): Payer: Commercial Managed Care - HMO | Admitting: Family Medicine

## 2016-04-10 ENCOUNTER — Encounter: Payer: Self-pay | Admitting: Family Medicine

## 2016-04-10 VITALS — BP 138/78 | HR 67 | Temp 96.7°F | Ht 70.0 in | Wt 198.0 lb

## 2016-04-10 DIAGNOSIS — R319 Hematuria, unspecified: Secondary | ICD-10-CM | POA: Diagnosis not present

## 2016-04-10 DIAGNOSIS — I10 Essential (primary) hypertension: Secondary | ICD-10-CM | POA: Diagnosis not present

## 2016-04-10 MED ORDER — AMLODIPINE BESYLATE 5 MG PO TABS: ORAL_TABLET | ORAL | 1 refills | Status: DC

## 2016-04-10 NOTE — Progress Notes (Signed)
Subjective:    Patient ID: Hector Torres, male    DOB: 07-23-1941, 75 y.o.   MRN: [id number]  HPI Patient here today for 4 week follow up on HTN and recent medication changes.  At his last visit here he was given amlodipine and also started on Levsin. There were specific reasons related to his chest pain by these medicines were started but he seems pleased that the results thus far are favorable. He has bought a new blood pressure cuff and his readings are well aligned with her cuff here and are for the most part very good. He has only had one slight episode of chest pain since going on the antispasmodic, Levsin.    Patient Active Problem List   Diagnosis Date Noted  . NSTEMI (non-ST elevated myocardial infarction) ([place])   . Chest pain 05/18/2015  . Tachycardia 05/18/2015  . Dizziness and giddiness 04/14/2015  . Vision, loss, sudden 04/14/2015  . Ischemic chest pain ([place]) 02/03/2015  . Accelerating angina ([place])   . Kidney stone   . BPH (benign prostatic hypertrophy)   . Other chest pain 10/14/2014  . Chest pressure 10/14/2014  . TIA (transient ischemic attack)   . Prostate nodule   . PVD (peripheral vascular disease) ([place])   . Vitamin D deficiency   . HTN (hypertension) 12/25/2012  . HLD (hyperlipidemia) 12/25/2012  . Screening for prostate cancer 12/25/2012  . Occlusion and stenosis of carotid artery without mention of cerebral infarction 04/03/2012  . Coronary atherosclerosis of native coronary artery 04/15/2011  . Mixed hyperlipidemia 04/15/2011   Outpatient Encounter Prescriptions as of 04/10/2016  Medication Sig  . amLODipine (NORVASC) 5 MG tablet Take 1/2 daily  . aspirin 81 MG tablet Take 81 mg by mouth daily.  . carvedilol (COREG) 6.25 MG tablet Take 1 tablet (6.25 mg total) by mouth 2 (two) times daily with a meal.  . Cholecalciferol (VITAMIN D) 1000 UNITS capsule Take 5,000 Units by mouth daily.   . diphenhydramine-acetaminophen (TYLENOL PM) 25-500 MG TABS tablet  Take 2 tablets by mouth at bedtime as needed (sleep/pain).  . hydrochlorothiazide (HYDRODIURIL) 25 MG tablet Take 1 tablet (25 mg total) by mouth daily. (Patient taking differently: Take 25 mg by mouth daily as needed (swelling, fluid). )  . hyoscyamine (LEVSIN/SL) 0.125 MG SL tablet Place 1 tablet (0.125 mg total) under the tongue every 4 (four) hours as needed. For chest pain  . isosorbide mononitrate (IMDUR) 30 MG 24 hr tablet Take 1 tablet (30 mg total) by mouth daily.  Marland Kitchen losartan (COZAAR) 50 MG tablet Take 1 tablet (50 mg total) by mouth daily.  . Multiple Vitamin (MULTIVITAMIN PO) Take 1 tablet by mouth daily.    Marland Kitchen NITROSTAT 0.4 MG SL tablet PLACE 1 TABLET UNDER THE TONGUE EVERY 5 MINUTES AS NEEDED FOR CHEST PAIN.  . rosuvastatin (CRESTOR) 20 MG tablet TAKE 1 TABLET BY MOUTH EVERY DAY  . tamsulosin (FLOMAX) 0.4 MG CAPS capsule Take 0.4 mg by mouth daily after supper.  . vitamin E 400 UNIT capsule Take 400 Units by mouth daily.    . [DISCONTINUED] finasteride (PROSCAR) 5 MG tablet Take 1 tablet by mouth daily.   No facility-administered encounter medications on file as of 04/10/2016.       Review of Systems  Constitutional: Negative.   HENT: Negative.   Eyes: Negative.   Respiratory: Negative.   Cardiovascular: Negative.   Gastrointestinal: Negative.   Endocrine: Negative.   Genitourinary: Negative.   Musculoskeletal: Negative.  Skin: Negative.   Allergic/Immunologic: Negative.   Neurological: Negative.   Hematological: Negative.   Psychiatric/Behavioral: Negative.        Objective:   Physical Exam  Constitutional: He is oriented to person, place, and time. He appears well-developed and well-nourished.  Cardiovascular: Normal rate, regular rhythm and normal heart sounds.   Pulmonary/Chest: Effort normal and breath sounds normal.  Neurological: He is alert and oriented to person, place, and time.  Psychiatric: He has a normal mood and affect.   BP 134/82 (BP Location:  Left Arm)   Pulse 60   Temp (!) 96.7 F (35.9 C) (Oral)   Ht 5\' 10"  (1.778 m)   Wt 198 lb (89.8 kg)   SpO2 94%   BMI 28.41 kg/m         Assessment & Plan:  1. Essential hypertension   [place] with losartan and amlodipine. Also continue on Levsin for chest pain.

## 2016-04-15 ENCOUNTER — Telehealth: Payer: Self-pay | Admitting: Family Medicine

## 2016-04-15 MED ORDER — AMLODIPINE BESYLATE 5 MG PO TABS: ORAL_TABLET | ORAL | 1 refills | Status: DC

## 2016-04-15 MED ORDER — HYOSCYAMINE SULFATE 0.125 MG SL SUBL: 0.1250 mg | SUBLINGUAL_TABLET | SUBLINGUAL | 1 refills | Status: DC | PRN

## 2016-04-15 NOTE — Telephone Encounter (Signed)
Do you know anything about new medicine?

## 2016-05-02 DIAGNOSIS — N3000 Acute cystitis without hematuria: Secondary | ICD-10-CM | POA: Diagnosis not present

## 2016-05-23 ENCOUNTER — Other Ambulatory Visit: Payer: Self-pay | Admitting: Cardiology

## 2016-10-09 ENCOUNTER — Ambulatory Visit (INDEPENDENT_AMBULATORY_CARE_PROVIDER_SITE_OTHER): Payer: Medicare HMO

## 2016-10-09 ENCOUNTER — Ambulatory Visit (INDEPENDENT_AMBULATORY_CARE_PROVIDER_SITE_OTHER): Payer: Medicare HMO | Admitting: Family Medicine

## 2016-10-09 ENCOUNTER — Encounter: Payer: Self-pay | Admitting: Family Medicine

## 2016-10-09 VITALS — BP 87/59 | HR 75 | Temp 97.4°F | Ht 70.0 in | Wt 210.0 lb

## 2016-10-09 DIAGNOSIS — I10 Essential (primary) hypertension: Secondary | ICD-10-CM

## 2016-10-09 DIAGNOSIS — N400 Enlarged prostate without lower urinary tract symptoms: Secondary | ICD-10-CM

## 2016-10-09 DIAGNOSIS — E782 Mixed hyperlipidemia: Secondary | ICD-10-CM

## 2016-10-09 DIAGNOSIS — R600 Localized edema: Secondary | ICD-10-CM | POA: Diagnosis not present

## 2016-10-09 DIAGNOSIS — R079 Chest pain, unspecified: Secondary | ICD-10-CM

## 2016-10-09 LAB — MICROSCOPIC EXAMINATION
Bacteria, UA: NONE SEEN
Renal Epithel, UA: NONE SEEN /hpf
WBC, UA: NONE SEEN /hpf (ref 0–?)

## 2016-10-09 LAB — URINALYSIS, COMPLETE
Bilirubin, UA: NEGATIVE
Glucose, UA: NEGATIVE
KETONES UA: NEGATIVE
LEUKOCYTES UA: NEGATIVE
Nitrite, UA: NEGATIVE
PH UA: 7 (ref 5.0–7.5)
Protein, UA: NEGATIVE
SPEC GRAV UA: 1.02 (ref 1.005–1.030)
Urobilinogen, Ur: 0.2 mg/dL (ref 0.2–1.0)

## 2016-10-09 MED ORDER — HYDROCHLOROTHIAZIDE 25 MG PO TABS: 25.0000 mg | ORAL_TABLET | Freq: Every day | ORAL | 5 refills | Status: DC

## 2016-10-09 NOTE — Progress Notes (Signed)
Subjective:    Patient ID: Hector Torres, male    DOB: 06-13-1941, 76 y.o.   MRN: [id number]  HPI Pt here for follow up and management of chronic medical problems which includes hyperlipidemia and hypertension. He is taking medication regularly. Still complains of intermittent chest pain. I thought at the last visit the antispasmodic was reported to be helping but he has stopped the medicine. He has another appointment with the cardiologist in about 3 weeks. The pain is variously described as sharp stabbing and other times heaviness.    Patient Active Problem List   Diagnosis Date Noted  . NSTEMI (non-ST elevated myocardial infarction) ([place])   . Chest pain 05/18/2015  . Tachycardia 05/18/2015  . Dizziness and giddiness 04/14/2015  . Vision, loss, sudden 04/14/2015  . Ischemic chest pain ([place]) 02/03/2015  . Accelerating angina ([place])   . Kidney stone   . BPH (benign prostatic hypertrophy)   . Other chest pain 10/14/2014  . Chest pressure 10/14/2014  . TIA (transient ischemic attack)   . Prostate nodule   . PVD (peripheral vascular disease) ([place])   . Vitamin D deficiency   . HTN (hypertension) 12/25/2012  . HLD (hyperlipidemia) 12/25/2012  . Screening for prostate cancer 12/25/2012  . Occlusion and stenosis of carotid artery without mention of cerebral infarction 04/03/2012  . Coronary atherosclerosis of native coronary artery 04/15/2011  . Mixed hyperlipidemia 04/15/2011   Outpatient Encounter Prescriptions as of 10/09/2016  Medication Sig  . amLODipine (NORVASC) 5 MG tablet Take 1 tab daily  . aspirin 81 MG tablet Take 81 mg by mouth daily.  . carvedilol (COREG) 6.25 MG tablet Take 1 tablet (6.25 mg total) by mouth 2 (two) times daily with a meal.  . Cholecalciferol (VITAMIN D) 1000 UNITS capsule Take 5,000 Units by mouth daily.   . diphenhydramine-acetaminophen (TYLENOL PM) 25-500 MG TABS tablet Take 2 tablets by mouth at bedtime as needed (sleep/pain).  .  hydrochlorothiazide (HYDRODIURIL) 25 MG tablet Take 1 tablet (25 mg total) by mouth daily. (Patient taking differently: Take 25 mg by mouth daily as needed (swelling, fluid). )  . isosorbide mononitrate (IMDUR) 30 MG 24 hr tablet Take 1 tablet (30 mg total) by mouth daily.  . Multiple Vitamin (MULTIVITAMIN PO) Take 1 tablet by mouth daily.    Marland Kitchen NITROSTAT 0.4 MG SL tablet PLACE 1 TABLET UNDER THE TONGUE EVERY 5 MINUTES AS NEEDED FOR CHEST PAIN.  . rosuvastatin (CRESTOR) 20 MG tablet TAKE 1 TABLET BY MOUTH EVERY DAY  . tamsulosin (FLOMAX) 0.4 MG CAPS capsule Take 0.4 mg by mouth daily after supper.  . vitamin E 400 UNIT capsule Take 400 Units by mouth daily.    . [DISCONTINUED] hyoscyamine (LEVSIN/SL) 0.125 MG SL tablet Place 1 tablet (0.125 mg total) under the tongue every 4 (four) hours as needed. For chest pain  . [DISCONTINUED] losartan (COZAAR) 50 MG tablet Take 1 tablet (50 mg total) by mouth daily.  . [DISCONTINUED] losartan (COZAAR) 50 MG tablet TAKE 1 TABLET (50 MG TOTAL) BY MOUTH DAILY.   No facility-administered encounter medications on file as of 10/09/2016.      Review of Systems  Constitutional: Negative.   HENT: Negative.   Eyes: Negative.   Respiratory: Positive for chest tightness (on-going).   Cardiovascular: Positive for leg swelling (bilateral feet and ankles).  Gastrointestinal: Negative.   Endocrine: Negative.   Genitourinary: Negative.   Musculoskeletal: Negative.   Skin: Negative.   Allergic/Immunologic: Negative.   Neurological: Negative.  Hematological: Negative.   Psychiatric/Behavioral: Negative.        Objective:   Physical Exam  Constitutional: He is oriented to person, place, and time. He appears well-developed and well-nourished.  HENT:  Mouth/Throat: Oropharynx is clear and moist.  Eyes: Pupils are equal, round, and reactive to light.  Neck: Normal range of motion.  Cardiovascular: Normal rate, regular rhythm and normal heart sounds.     Pulmonary/Chest: Effort normal and breath sounds normal.  Abdominal: Soft.  Genitourinary: Rectum normal and prostate normal.  Musculoskeletal: Normal range of motion.  Neurological: He is alert and oriented to person, place, and time.  Psychiatric: He has a normal mood and affect. His behavior is normal.    BP (!) 87/59 (BP Location: Left Arm)   Pulse 75   Temp 97.4 F (36.3 C) (Oral)   Ht '5\' 10"'  (1.778 m)   Wt 210 lb (95.3 kg)   BMI 30.13 kg/m        Assessment & Plan:  1. Mixed hyperlipidemia Lipids have been managed with Crestor  - Lipid panel  2. Essential hypertension Blood pressure was initially low when first checked it on repeat exam it was 149/87 - CMP14+EGFR - DG Chest 2 View; Future  3. Benign prostatic hyperplasia, unspecified whether lower urinary tract symptoms present Exam normal no nodules felt - PSA, total and free - Urinalysis, Complete  4. Localized edema Edema is trace today. He reports it will be no worse by evening - DG Chest 2 View; Future  5. Chest pain, unspecified type Scratch there is some wheezing. He denies asthma but says he has been told he may have some mild COPD - DG Chest 2 View; Future  Wardell Honour MD

## 2016-10-09 NOTE — Addendum Note (Signed)
Addended by: Zannie Cove on: 10/09/2016 02:27 PM   Modules accepted: Orders

## 2016-10-09 NOTE — Patient Instructions (Signed)
Medicare Annual Wellness Visit  [facility] and the medical providers at [facility] strive to bring you the best medical care.  In doing so we not only want to address your current medical conditions and concerns but also to detect new conditions early and prevent illness, disease and health-related problems.    Medicare offers a yearly Wellness Visit which allows our clinical staff to assess your need for preventative services including immunizations, lifestyle education, counseling to decrease risk of preventable diseases and screening for fall risk and other medical concerns.    This visit is provided free of charge (no copay) for all Medicare recipients. The clinical pharmacists at [facility] have begun to conduct these Wellness Visits which will also include a thorough review of all your medications.    As you primary medical provider recommend that you make an appointment for your Annual Wellness Visit if you have not done so already this year.  You may set up this appointment before you leave today or you may call back ([phone]) and schedule an appointment.  Please make sure when you call that you mention that you are scheduling your Annual Wellness Visit with the clinical pharmacist so that the appointment may be made for the proper length of time.     Continue current medications. Continue good therapeutic lifestyle changes which include good diet and exercise. Fall precautions discussed with patient. If an FOBT was given today- please return it to our front desk. If you are over 50 years old - you may need Prevnar 13 or the adult Pneumonia vaccine.  **Flu shots are available--- please call and schedule a FLU-CLINIC appointment**  After your visit with us today you will receive a survey in the mail or online from [employer] regarding your care with us. Please take a moment to fill this out. Your feedback is very  important to us as you can help us better understand your patient needs as well as improve your experience and satisfaction. WE CARE ABOUT YOU!!!    

## 2016-10-10 LAB — CMP14+EGFR
ALT: 15 [IU]/L (ref 0–44)
AST: 17 [IU]/L (ref 0–40)
Albumin/Globulin Ratio: 1.8 (ref 1.2–2.2)
Albumin: 4.2 g/dL (ref 3.5–4.8)
Alkaline Phosphatase: 63 [IU]/L (ref 39–117)
BUN/Creatinine Ratio: 13 (ref 10–24)
BUN: 16 mg/dL (ref 8–27)
Bilirubin Total: 0.5 mg/dL (ref 0.0–1.2)
CO2: 30 mmol/L — ABNORMAL HIGH (ref 18–29)
Calcium: 9 mg/dL (ref 8.6–10.2)
Chloride: 97 mmol/L (ref 96–106)
Creatinine, Ser: 1.2 mg/dL (ref 0.76–1.27)
GFR calc Af Amer: 67
GFR calc non Af Amer: 58 — ABNORMAL LOW
Globulin, Total: 2.3 (ref 1.5–4.5)
Glucose: 89 mg/dL (ref 65–99)
Potassium: 3.7 mmol/L (ref 3.5–5.2)
Sodium: 144 mmol/L (ref 134–144)
Total Protein: 6.5 g/dL (ref 6.0–8.5)

## 2016-10-10 LAB — LIPID PANEL
Chol/HDL Ratio: 2.5 (ref 0.0–5.0)
Cholesterol, Total: 113 mg/dL (ref 100–199)
HDL: 45 mg/dL
LDL Calculated: 50 (ref 0–99)
Triglycerides: 88 mg/dL (ref 0–149)
VLDL Cholesterol Cal: 18 (ref 5–40)

## 2016-10-10 LAB — PSA, TOTAL AND FREE
PSA, Free Pct: 18
PSA, Free: 0.18 ng/mL
Prostate Specific Ag, Serum: 1 ng/mL (ref 0.0–4.0)

## 2016-10-11 ENCOUNTER — Other Ambulatory Visit: Payer: Self-pay | Admitting: Family Medicine

## 2016-10-21 ENCOUNTER — Other Ambulatory Visit: Payer: Self-pay

## 2016-10-21 ENCOUNTER — Telehealth: Payer: Self-pay | Admitting: Family Medicine

## 2016-10-21 DIAGNOSIS — R319 Hematuria, unspecified: Secondary | ICD-10-CM

## 2016-10-21 NOTE — Telephone Encounter (Signed)
Patient aware per Dr. Ammie Ferrier lab note- he can bring in a urine after pushing fluids for a week. Patient states he will bring in tomorrow. Order placed.

## 2016-10-22 ENCOUNTER — Other Ambulatory Visit: Payer: Self-pay | Admitting: *Deleted

## 2016-10-22 ENCOUNTER — Other Ambulatory Visit (INDEPENDENT_AMBULATORY_CARE_PROVIDER_SITE_OTHER): Payer: Medicare HMO

## 2016-10-22 DIAGNOSIS — R319 Hematuria, unspecified: Secondary | ICD-10-CM | POA: Diagnosis not present

## 2016-10-25 LAB — MICROSCOPIC EXAMINATION
Bacteria, UA: NONE SEEN
Renal Epithel, UA: NONE SEEN /hpf
WBC, UA: NONE SEEN /hpf (ref 0–?)

## 2016-10-25 LAB — URINALYSIS, COMPLETE
BILIRUBIN UA: NEGATIVE
Glucose, UA: NEGATIVE
Ketones, UA: NEGATIVE
Leukocytes, UA: NEGATIVE
NITRITE UA: NEGATIVE
Protein, UA: NEGATIVE
Specific Gravity, UA: 1.015 (ref 1.005–1.030)
Urobilinogen, Ur: 0.2 mg/dL (ref 0.2–1.0)
pH, UA: 6 (ref 5.0–7.5)

## 2016-10-29 ENCOUNTER — Ambulatory Visit (HOSPITAL_COMMUNITY)
Admission: RE | Admit: 2016-10-29 | Discharge: 2016-10-29 | Disposition: A | Discharge status: Home / Self Care | Payer: Medicare HMO | Source: Ambulatory Visit | Attending: Family Medicine | Admitting: Family Medicine

## 2016-10-29 DIAGNOSIS — N200 Calculus of kidney: Secondary | ICD-10-CM | POA: Diagnosis not present

## 2016-10-29 DIAGNOSIS — R319 Hematuria, unspecified: Secondary | ICD-10-CM | POA: Insufficient documentation

## 2016-10-29 NOTE — Progress Notes (Signed)
Cardiology Office Note  Date: 10/30/2016   ID: Hector Torres, DOB 23-Oct-1940, MRN [id number]  PCP: Wardell Honour, MD  Primary Cardiologist: Rozann Lesches, MD   Chief Complaint  Patient presents with  . Coronary Artery Disease    History of Present Illness: Bee Hammerschmidt is a 76 y.o. male last seen in August 2017. He presents for a routine follow-up visit. He does not report any change in intermittent chest pain symptoms which have been difficult to sort out in terms of anginal versus esophageal etiology. These do not occur regularly with exertion and he reports NYHA class II dyspnea.  I reviewed his medications. Current cardiac regimen includes aspirin, Coreg, HCTZ, Imdur, Crestor, and as needed nitroglycerin.  He had recent lab work showing good lipid control as outlined below.  States that he is having some recent trouble with hematuria undergoing workup, he was just seen in [place] [facility] [place] for ultrasound imaging of his kidneys and bladder.  Past Medical History:  Diagnosis Date  . Arthritis   . BPH (benign prostatic hypertrophy)   . Carotid artery disease (HCC)    Bilateral - Dr. Bridgett Larsson  . Colon polyp   . COPD (chronic obstructive pulmonary disease) ([place])   . Coronary atherosclerosis of native coronary artery    PTCA circumflex at [facility], nonobstructive residual disease at catheterization June and October 2016  . Essential hypertension, benign    Possible white coat  . GERD (gastroesophageal reflux disease)   . History of nephrolithiasis   . Mixed hyperlipidemia   . Prostate nodule   . PVD (peripheral vascular disease) ([place])   . TIA (transient ischemic attack)   . Vitamin D deficiency     Past Surgical History:  Procedure Laterality Date  . ANGIOPLASTY Left 1995   Cir. Artery  . APPENDECTOMY  1947  . CARDIAC CATHETERIZATION N/A 02/03/2015   Procedure: Left Heart Cath and Cors/Grafts Angiography;  Surgeon: Sherren Mocha, MD;  Location: [place] [facility];   Service: Cardiovascular;  Laterality: N/A;  . CARDIAC CATHETERIZATION N/A 05/19/2015   Procedure: Left Heart Cath and Coronary Angiography;  Surgeon: Troy Sine, MD;  Location: [place] [facility];  Service: Cardiovascular;  Laterality: N/A;  . EYE SURGERY     Bilateral cataract  . TONSILLECTOMY  1968  . Traumatic injury  1962   Lost tip of index, middle, and ring finger/ right hand    Current Outpatient Prescriptions  Medication Sig Dispense Refill  . aspirin 81 MG tablet Take 81 mg by mouth daily.    . carvedilol (COREG) 6.25 MG tablet TAKE 1 TABLET (6.25 MG TOTAL) BY MOUTH 2 (TWO) TIMES DAILY WITH A MEAL. 180 tablet 1  . Cholecalciferol (VITAMIN D) 1000 UNITS capsule Take 5,000 Units by mouth daily.     . diphenhydramine-acetaminophen (TYLENOL PM) 25-500 MG TABS tablet Take 2 tablets by mouth at bedtime as needed (sleep/pain).    . hydrochlorothiazide (HYDRODIURIL) 25 MG tablet Take 1 tablet (25 mg total) by mouth daily. 30 tablet 5  . isosorbide mononitrate (IMDUR) 30 MG 24 hr tablet Take 1 tablet (30 mg total) by mouth daily. 90 tablet 3  . Multiple Vitamin (MULTIVITAMIN PO) Take 1 tablet by mouth daily.      Marland Kitchen NITROSTAT 0.4 MG SL tablet PLACE 1 TABLET UNDER THE TONGUE EVERY 5 MINUTES AS NEEDED FOR CHEST PAIN. 25 tablet 3  . rosuvastatin (CRESTOR) 20 MG tablet TAKE 1 TABLET BY MOUTH EVERY DAY 90 tablet 1  .  tamsulosin (FLOMAX) 0.4 MG CAPS capsule Take 0.4 mg by mouth daily after supper.    . vitamin E 400 UNIT capsule Take 400 Units by mouth daily.       No current facility-administered medications for this visit.    Allergies:  Patient has no known allergies.   Social History: The patient  reports that he quit smoking about 5 years ago. His smoking use included Cigarettes. He has a 90.00 pack-year smoking history. He has never used smokeless tobacco. He reports that he does not drink alcohol or use drugs.   ROS:  Please see the history of present illness. Otherwise, complete  review of systems is positive for hematuria.  All other systems are reviewed and negative.   Physical Exam: VS:  BP (!) 151/79   Pulse 71   Ht 5\' 10"  (1.778 m)   Wt 214 lb (97.1 kg)   BMI 30.71 kg/m , BMI Body mass index is 30.71 kg/m.  Wt Readings from Last 3 Encounters:  10/30/16 214 lb (97.1 kg)  10/09/16 210 lb (95.3 kg)  04/10/16 198 lb (89.8 kg)    General: Elderly male, appears comfortable at rest. HEENT: Conjunctiva and lids normal, oropharynx clear. Neck: Supple, no elevated JVP or carotid bruits, no thyromegaly. Lungs: Clear to auscultation, nonlabored breathing at rest. Cardiac: Regular rate and rhythm, no S3, 2/6 systolic murmur at left base, no pericardial rub. Abdomen: Soft, nontender, bowel sounds present, no guarding or rebound. Extremities: No pitting edema, distal pulses 2+. Skin: Warm and dry. Musculoskeletal: No kyphosis. Neuropsychiatric: Alert and oriented x3, affect grossly appropriate.  ECG: I personally reviewed the tracing from 04/05/2016 which showed sinus rhythm with nonspecific ST-T changes.  Recent Labwork: 10/09/2016: ALT 15; AST 17; BUN 16; Creatinine, Ser 1.20; Potassium 3.7; Sodium 144     Component Value Date/Time   CHOL 113 10/09/2016 0948   CHOL 102 12/25/2012 1251   TRIG 88 10/09/2016 0948   TRIG 82 12/25/2012 1251   HDL 45 10/09/2016 0948   HDL 36 (L) 12/25/2012 1251   CHOLHDL 2.5 10/09/2016 0948   LDLCALC 50 10/09/2016 0948   LDLCALC 50 12/25/2012 1251    Other Studies Reviewed Today:  Cardiac catheterization 05/19/2015:  1st Diag lesion, 50% stenosed.  Prox LAD lesion, 20% stenosed.  Prox Cx lesion, 30% stenosed.  Mid Cx lesion, 20% stenosed.  Dist Cx lesion, 20% stenosed.  The left ventricular systolic function is normal.  Hyperdynamic LV function with an ejection fraction of 70-75% with evidence for left ventricular hypertrophy without focal segmental wall motion abnormalities.  No significant coronary obstructive  disease with mild 20% luminal narrowing in the proximal LAD, 50% ostial stenosis in a diminutive first diagonal branch; scattered smooth 30% proximal, 20% percent mid, and 20% distal left circumflex narrowing in a dominant left circumflex vessel and a small nondominant RCA.  Assessment and Plan:  1. Nonobstructive CAD with remote angioplasty of the circumflex and stable angina symptoms. Plan to continue medical therapy and observation. He remains on aspirin, beta blocker, long-acting nitrate, and statin.  2. Hyperlipidemia, on Crestor. Recent LDL 50.  3. Essential hypertension with whitecoat hypertension. No changes made to current regimen. Keep follow-up with Dr. Sabra Heck.  4. Carotid artery disease, less than 40% RICA stenosis and 26-37% LICA stenosis by Dopplers in 2016. He is due for a follow-up study. Previously followed by [facility].  Current medicines were reviewed with the patient today.  Disposition: Follow-up in 6 months.  Signed, Satira Sark,  MD, [facility] 10/30/2016 11:44 AM    [facility] [place], [place], [place] [address] Phone: [phone]; Fax: [phone]

## 2016-10-30 ENCOUNTER — Ambulatory Visit (INDEPENDENT_AMBULATORY_CARE_PROVIDER_SITE_OTHER): Payer: Medicare HMO | Admitting: Cardiology

## 2016-10-30 ENCOUNTER — Encounter: Payer: Self-pay | Admitting: Cardiology

## 2016-10-30 VITALS — BP 151/79 | HR 71 | Ht 70.0 in | Wt 214.0 lb

## 2016-10-30 DIAGNOSIS — E782 Mixed hyperlipidemia: Secondary | ICD-10-CM

## 2016-10-30 DIAGNOSIS — I779 Disorder of arteries and arterioles, unspecified: Secondary | ICD-10-CM

## 2016-10-30 DIAGNOSIS — I739 Peripheral vascular disease, unspecified: Secondary | ICD-10-CM

## 2016-10-30 DIAGNOSIS — I10 Essential (primary) hypertension: Secondary | ICD-10-CM | POA: Diagnosis not present

## 2016-10-30 DIAGNOSIS — I25119 Atherosclerotic heart disease of native coronary artery with unspecified angina pectoris: Secondary | ICD-10-CM | POA: Diagnosis not present

## 2016-10-30 NOTE — Patient Instructions (Signed)

## 2016-11-01 ENCOUNTER — Other Ambulatory Visit: Payer: Self-pay | Admitting: *Deleted

## 2016-11-01 DIAGNOSIS — I779 Disorder of arteries and arterioles, unspecified: Secondary | ICD-10-CM

## 2016-11-01 DIAGNOSIS — I739 Peripheral vascular disease, unspecified: Principal | ICD-10-CM

## 2016-11-02 ENCOUNTER — Other Ambulatory Visit: Payer: Self-pay | Admitting: Family Medicine

## 2016-11-11 ENCOUNTER — Telehealth: Payer: Self-pay | Admitting: Family Medicine

## 2016-11-11 DIAGNOSIS — R319 Hematuria, unspecified: Secondary | ICD-10-CM

## 2016-11-11 NOTE — Telephone Encounter (Signed)
I would still get a urology referral so that the patient is in the system so that in case he needs a urologist one will be there for him.

## 2016-11-11 NOTE — Telephone Encounter (Signed)
Patient aware of renal results showing kidney Hector Torres.  He wants to know if there is anything else he needs to do? It is not causing pain at this time. Dr. Sabra Heck had suggested a urology referral before the [place] was done.

## 2016-11-11 NOTE — Telephone Encounter (Signed)
Aware. Referral done to urology.

## 2016-11-14 DIAGNOSIS — N200 Calculus of kidney: Secondary | ICD-10-CM | POA: Diagnosis not present

## 2016-11-14 DIAGNOSIS — R3121 Asymptomatic microscopic hematuria: Secondary | ICD-10-CM | POA: Diagnosis not present

## 2016-11-20 ENCOUNTER — Ambulatory Visit: Payer: Medicare HMO

## 2016-11-20 DIAGNOSIS — I739 Peripheral vascular disease, unspecified: Principal | ICD-10-CM

## 2016-11-20 DIAGNOSIS — I779 Disorder of arteries and arterioles, unspecified: Secondary | ICD-10-CM

## 2016-11-20 DIAGNOSIS — I6523 Occlusion and stenosis of bilateral carotid arteries: Secondary | ICD-10-CM

## 2016-11-21 LAB — VAS US CAROTID
LCCAPSYS: 77 cm/s
LEFT ECA DIAS: 0 cm/s
LICADSYS: -149 cm/s
LICAPSYS: -135 cm/s
Left CCA dist dias: -20 cm/s
Left CCA dist sys: -88 cm/s
Left CCA prox dias: 14 cm/s
Left ICA dist dias: -33 cm/s
Left ICA prox dias: -36 cm/s
RCCADSYS: -77 cm/s
RCCAPSYS: 65 cm/s
RIGHT ECA DIAS: 0 cm/s
RIGHT VERTEBRAL DIAS: -15 cm/s
Right CCA prox dias: 9 cm/s

## 2016-11-22 ENCOUNTER — Telehealth: Payer: Self-pay | Admitting: *Deleted

## 2016-11-22 ENCOUNTER — Other Ambulatory Visit: Payer: Self-pay | Admitting: Cardiology

## 2016-11-22 NOTE — Telephone Encounter (Signed)
-----   Message from Satira Sark, MD sent at 11/22/2016  2:01 PM EDT ----- Results reviewed. 1-39% RICA stenosis, upper end of range. 33-54% LICA stenosis. Would continue medical therapy. A copy of this test should be forwarded to Redge Gainer, MD.

## 2016-11-22 NOTE — Telephone Encounter (Signed)
Patient informed. 

## 2016-11-30 ENCOUNTER — Other Ambulatory Visit: Payer: Self-pay | Admitting: Family Medicine

## 2016-11-30 DIAGNOSIS — E785 Hyperlipidemia, unspecified: Secondary | ICD-10-CM

## 2016-12-18 DIAGNOSIS — N200 Calculus of kidney: Secondary | ICD-10-CM | POA: Diagnosis not present

## 2016-12-18 DIAGNOSIS — R3121 Asymptomatic microscopic hematuria: Secondary | ICD-10-CM | POA: Diagnosis not present

## 2016-12-25 ENCOUNTER — Other Ambulatory Visit: Payer: Self-pay

## 2016-12-25 MED ORDER — HYDROCHLOROTHIAZIDE 25 MG PO TABS: 25.0000 mg | ORAL_TABLET | Freq: Every day | ORAL | 3 refills | Status: DC

## 2016-12-25 MED ORDER — HYDROCHLOROTHIAZIDE 25 MG PO TABS: 25.0000 mg | ORAL_TABLET | Freq: Every day | ORAL | 0 refills | Status: DC

## 2017-03-14 DIAGNOSIS — H31003 Unspecified chorioretinal scars, bilateral: Secondary | ICD-10-CM | POA: Diagnosis not present

## 2017-03-14 DIAGNOSIS — H52203 Unspecified astigmatism, bilateral: Secondary | ICD-10-CM | POA: Diagnosis not present

## 2017-03-14 DIAGNOSIS — Z961 Presence of intraocular lens: Secondary | ICD-10-CM | POA: Diagnosis not present

## 2017-03-14 DIAGNOSIS — H04123 Dry eye syndrome of bilateral lacrimal glands: Secondary | ICD-10-CM | POA: Diagnosis not present

## 2017-03-14 DIAGNOSIS — H35371 Puckering of macula, right eye: Secondary | ICD-10-CM | POA: Diagnosis not present

## 2017-06-03 ENCOUNTER — Other Ambulatory Visit: Payer: Self-pay | Admitting: Family

## 2017-06-03 DIAGNOSIS — E785 Hyperlipidemia, unspecified: Secondary | ICD-10-CM

## 2017-08-01 ENCOUNTER — Other Ambulatory Visit: Payer: Self-pay | Admitting: Family Medicine

## 2017-08-01 ENCOUNTER — Encounter: Payer: Self-pay | Admitting: Family Medicine

## 2017-08-01 ENCOUNTER — Ambulatory Visit: Payer: Medicare HMO | Admitting: Family Medicine

## 2017-08-01 ENCOUNTER — Telehealth: Payer: Self-pay | Admitting: Family Medicine

## 2017-08-01 VITALS — BP 190/90 | HR 69 | Temp 97.1°F | Ht 70.0 in | Wt 204.0 lb

## 2017-08-01 DIAGNOSIS — R198 Other specified symptoms and signs involving the digestive system and abdomen: Secondary | ICD-10-CM | POA: Diagnosis not present

## 2017-08-01 DIAGNOSIS — E782 Mixed hyperlipidemia: Secondary | ICD-10-CM | POA: Diagnosis not present

## 2017-08-01 DIAGNOSIS — I10 Essential (primary) hypertension: Secondary | ICD-10-CM | POA: Diagnosis not present

## 2017-08-01 DIAGNOSIS — I2510 Atherosclerotic heart disease of native coronary artery without angina pectoris: Secondary | ICD-10-CM

## 2017-08-01 DIAGNOSIS — J449 Chronic obstructive pulmonary disease, unspecified: Secondary | ICD-10-CM | POA: Insufficient documentation

## 2017-08-01 MED ORDER — AMLODIPINE BESYLATE 5 MG PO TABS: 5.0000 mg | ORAL_TABLET | Freq: Every day | ORAL | 0 refills | Status: DC

## 2017-08-01 MED ORDER — UMECLIDINIUM-VILANTEROL 62.5-25 MCG/INH IN AEPB: 1.0000 | INHALATION_SPRAY | Freq: Every day | RESPIRATORY_TRACT | 0 refills | Status: DC

## 2017-08-01 MED ORDER — UMECLIDINIUM-VILANTEROL 62.5-25 MCG/INH IN AEPB: 1.0000 | INHALATION_SPRAY | Freq: Every day | RESPIRATORY_TRACT | 2 refills | Status: DC

## 2017-08-01 MED ORDER — CARVEDILOL 6.25 MG PO TABS: 6.2500 mg | ORAL_TABLET | Freq: Two times a day (BID) | ORAL | 1 refills | Status: DC

## 2017-08-01 MED ORDER — HYDROCHLOROTHIAZIDE 25 MG PO TABS: 25.0000 mg | ORAL_TABLET | Freq: Every day | ORAL | 0 refills | Status: DC

## 2017-08-01 NOTE — Assessment & Plan Note (Signed)
Blood pressure is not well controlled today.  He has no signs or symptoms of hypertensive emergency.  Our conversation today revealed that the patient actually does not take hydrochlorothiazide daily.  He is using the Coreg twice daily and the Imdur as prescribed.  I did recommend that he start taking the HCTZ on a daily basis.  He will monitor his blood pressures daily and record this.  Handout was provided.  He will call me if he has any blood pressures less than 110/60 or higher than 150/90.  We will plan to continue this regimen as long as he is controlled.  If he is still having blood pressures out of range, will consider adding back Norvasc 5 mg.  CMP, Lipid, TSH ordered.  He will follow-up in 2-4 weeks for blood pressure recheck and management.  Strict return precautions and reasons for emergent evaluation in the emergency department review with patient.  He voiced understanding and will follow-up as needed.

## 2017-08-01 NOTE — Telephone Encounter (Signed)
Patient aware that refill was sent in

## 2017-08-01 NOTE — Assessment & Plan Note (Signed)
Continue statin, BB.  Would consider switching Coreg to metoprolol if patient notices breathing difficulty since this is more selective.  However he seems to be doing well on the Coreg so we will plan to continue current regimen.  He has regular follow-up with his cardiologist.

## 2017-08-01 NOTE — Assessment & Plan Note (Signed)
Continue statin.  Lipid panel ordered.

## 2017-08-01 NOTE — Assessment & Plan Note (Signed)
Prior to today, I did not find this diagnosis on his problem list.  However, I do see a recent chest x-ray which did mention COPD findings radiographically.  He has been given a sample of Anoro in the past and he would like to continue this medication.  I think that this is appropriate and a prescription was written and provided to the patient today.  He will follow-up with me with regards to COPD.  If he is unable to afford the Anoro, I advised him to call our office in the next week or 2 to see if we had samples of an alternative.

## 2017-08-01 NOTE — Patient Instructions (Addendum)
Your blood pressure is too high.  Take the HCTZ daily.  Please continue to monitor your blood pressures at home.  Your goal blood pressure is less than 150/90.  If you are having blood pressures less than 110/60, please call our office.  If you do not tolerate the amlodipine well, please call our office.  Otherwise, please follow-up with me in 2 weeks for repeat blood pressure   Hypertension Hypertension is another name for high blood pressure. High blood pressure forces your heart to work harder to pump blood. This can cause problems over time. There are two numbers in a blood pressure reading. There is a top number (systolic) over a bottom number (diastolic). It is best to have a blood pressure below 120/80. Healthy choices can help lower your blood pressure. You may need medicine to help lower your blood pressure if:  Your blood pressure cannot be lowered with healthy choices.  Your blood pressure is higher than 130/80.  Follow these instructions at home: Eating and drinking  If directed, follow the DASH eating plan. This diet includes: ? Filling half of your plate at each meal with fruits and vegetables. ? Filling one quarter of your plate at each meal with whole grains. Whole grains include whole wheat pasta, brown rice, and whole grain bread. ? Eating or drinking low-fat dairy products, such as skim milk or low-fat yogurt. ? Filling one quarter of your plate at each meal with low-fat (lean) proteins. Low-fat proteins include fish, skinless chicken, eggs, beans, and tofu. ? Avoiding fatty meat, cured and processed meat, or chicken with skin. ? Avoiding premade or processed food.  Eat less than 1,500 mg of salt (sodium) a day.  Limit alcohol use to no more than 1 drink a day for nonpregnant women and 2 drinks a day for men. One drink equals 12 oz of beer, 5 oz of wine, or 1 oz of hard liquor. Lifestyle  Work with your doctor to stay at a healthy weight or to lose weight. Ask your  doctor what the best weight is for you.  Get at least 30 minutes of exercise that causes your heart to beat faster (aerobic exercise) most days of the week. This may include walking, swimming, or biking.  Get at least 30 minutes of exercise that strengthens your muscles (resistance exercise) at least 3 days a week. This may include lifting weights or pilates.  Do not use any products that contain nicotine or tobacco. This includes cigarettes and e-cigarettes. If you need help quitting, ask your doctor.  Check your blood pressure at home as told by your doctor.  Keep all follow-up visits as told by your doctor. This is important. Medicines  Take over-the-counter and prescription medicines only as told by your doctor. Follow directions carefully.  Do not skip doses of blood pressure medicine. The medicine does not work as well if you skip doses. Skipping doses also puts you at risk for problems.  Ask your doctor about side effects or reactions to medicines that you should watch for. Contact a doctor if:  You think you are having a reaction to the medicine you are taking.  You have headaches that keep coming back (recurring).  You feel dizzy.  You have swelling in your ankles.  You have trouble with your vision. Get help right away if:  You get a very bad headache.  You start to feel confused.  You feel weak or numb.  You feel faint.  You get very  bad pain in your: ? Chest. ? Belly (abdomen).  You throw up (vomit) more than once.  You have trouble breathing. Summary  Hypertension is another name for high blood pressure.  Making healthy choices can help lower blood pressure. If your blood pressure cannot be controlled with healthy choices, you may need to take medicine. This information is not intended to replace advice given to you by your health care provider. Make sure you discuss any questions you have with your health care provider. Document Released: 01/22/2008  Document Revised: 07/03/2016 Document Reviewed: 07/03/2016 Elsevier Interactive Patient Education  [employer].

## 2017-08-01 NOTE — Progress Notes (Signed)
Subjective: CC: med refills/ HTN/ CAD PCP: Chipper Herb, MD AJO:INOMVEH Hector Torres is a 76 y.o. male presenting to clinic today for:  1. Hypertension/ chronic chest pain/ HLD Patient reports Blood pressure at home: 110-120/78-83.  He reports that this morning he had a blood pressure of 201/101.  He does report having a high salt meal last evening consisting of pinto beans and ham.  He thinks that the blood pressure may be related to this.  He notes compliance with the Lipitor, Coreg and Imdur.  He is no longer on Norvasc.  He notes this was discontinued by Dr. Sabra Heck.  Additionally, he reports intermittent use of hydrochlorothiazide only when he is feeling swollen.  He reports chronic intermittent chest discomfort that he points to the upper abdomen.  He notes that this is been evaluated several times by his cardiologist.  He does have known coronary artery disease and has had an an STEMI in the past.  He saw his cardiologist in March who had no additional recommendations at that time.  Patient notes that he has not had chest pain in greater than 1 week.  He notes that he would like to be checked for H. pylori as a possible etiology.  He denies abdominal pain, nausea, vomiting, early satiety, melena, hematochezia, fevers, recent international travel. Denies headache, dizziness, visual changes, nausea, vomiting, LE swelling, abdominal pain or shortness of breath outside of normal.  He notes he has COPD and has been out of his medication see below.  He does report chronic phlegm production from his COPD.  2. COPD Patient denies having ever been hospitalized for COPD related issues.  He was given a sample of Anoro and would like to continue this medication if possible.  Reports chronic cough and shortness of breath with exertion.  Denies fevers, chills, fatigue, shortness of breath outside of baseline.  ROS: Per HPI  No Known Allergies Past Medical History:  Diagnosis Date  . Arthritis   . BPH  (benign prostatic hypertrophy)   . Carotid artery disease (HCC)    Bilateral - Dr. Bridgett Larsson  . Colon polyp   . COPD (chronic obstructive pulmonary disease) ([place])   . Coronary atherosclerosis of native coronary artery    PTCA circumflex at [facility], nonobstructive residual disease at catheterization June and October 2016  . Essential hypertension, benign    Possible white coat  . GERD (gastroesophageal reflux disease)   . History of nephrolithiasis   . Mixed hyperlipidemia   . Prostate nodule   . PVD (peripheral vascular disease) ([place])   . TIA (transient ischemic attack)   . Vitamin D deficiency     Current Outpatient Medications:  .  aspirin 81 MG tablet, Take 81 mg by mouth daily., Disp: , Rfl:  .  carvedilol (COREG) 6.25 MG tablet, TAKE 1 TABLET (6.25 MG TOTAL) BY MOUTH 2 (TWO) TIMES DAILY WITH A MEAL., Disp: 180 tablet, Rfl: 1 .  Cholecalciferol (VITAMIN D) 1000 UNITS capsule, Take 5,000 Units by mouth daily. , Disp: , Rfl:  .  diphenhydramine-acetaminophen (TYLENOL PM) 25-500 MG TABS tablet, Take 2 tablets by mouth at bedtime as needed (sleep/pain)., Disp: , Rfl:  .  hydrochlorothiazide (HYDRODIURIL) 25 MG tablet, Take 1 tablet (25 mg total) by mouth daily., Disp: 90 tablet, Rfl: 0 .  isosorbide mononitrate (IMDUR) 30 MG 24 hr tablet, TAKE 1 TABLET BY MOUTH DAILY, Disp: 90 tablet, Rfl: 3 .  Multiple Vitamin (MULTIVITAMIN PO), Take 1 tablet by mouth daily.  ,  Disp: , Rfl:  .  NITROSTAT 0.4 MG SL tablet, PLACE 1 TABLET UNDER THE TONGUE EVERY 5 MINUTES AS NEEDED FOR CHEST PAIN., Disp: 25 tablet, Rfl: 3 .  rosuvastatin (CRESTOR) 20 MG tablet, TAKE 1 TABLET BY MOUTH EVERY DAY, Disp: 90 tablet, Rfl: 1 .  vitamin E 400 UNIT capsule, Take 400 Units by mouth daily.  , Disp: , Rfl:  Social History   Socioeconomic History  . Marital status: Widowed    Spouse name: Not on file  . Number of children: Not on file  . Years of education: Not on file  . Highest education level: Not on file    Social Needs  . Financial resource strain: Not on file  . Food insecurity - worry: Not on file  . Food insecurity - inability: Not on file  . Transportation needs - medical: Not on file  . Transportation needs - non-medical: Not on file  Occupational History  . Not on file  Tobacco Use  . Smoking status: Former Smoker    Packs/day: 1.50    Years: 60.00    Pack years: 90.00    Types: Cigarettes    Last attempt to quit: 02/27/2011    Years since quitting: 6.4  . Smokeless tobacco: Never Used  Substance and Sexual Activity  . Alcohol use: No  . Drug use: No  . Sexual activity: Not on file  Other Topics Concern  . Not on file  Social History Narrative  . Not on file   Family History  Problem Relation Age of Onset  . Gastric cancer Mother   . Varicose Veins Mother   . Cancer Mother        Stomach  . Prostate cancer Father   . Cancer Father        Prostate  . Other Brother        carotid artery stenosis  . Diabetes Brother   . Leukemia Brother   . Hyperlipidemia Brother   . Hypertension Brother   . Colon cancer Brother   . Hyperlipidemia Brother   . Peripheral vascular disease Brother   . Cancer Brother        Colon and Prostate  . Hypertension Sister   . Hyperlipidemia Sister   . Diabetes Daughter   . Diabetes Son   . Prostate cancer Brother     Objective: Office vital signs reviewed. BP (!) 190/90   Pulse 69   Temp (!) 97.1 F (36.2 C) (Oral)   Ht '5\' 10"'$  (1.778 m)   Wt 204 lb (92.5 kg)   BMI 29.27 kg/m   Physical Examination:  General: Awake, alert, well nourished, well appearing male, No acute distress HEENT: Normal    Neck: No masses palpated. No lymphadenopathy, no JVD Cardio: regular rate and rhythm, U2V2 heard, systolic murmur appreciated at the left sternal border.  Pulm: Globally decreased breath sounds.  No apparent wheezes, rhonchi or rales.; normal work of breathing on room air MSK: Patient has full active range of motion of bilateral  knees.  No appreciated effusions, erythema or edema.  No tenderness to palpation.  He is able to ambulate independently though does require frequent resting. Extremities: Warm, well-perfused, no edema Neuro: Alert and oriented x3, no focal neurologic deficits.  Follows all commands.  Assessment/ Plan: 76 y.o. male   HTN (hypertension) Blood pressure is not well controlled today.  He has no signs or symptoms of hypertensive emergency.  Our conversation today revealed that the patient actually  does not take hydrochlorothiazide daily.  He is using the Coreg twice daily and the Imdur as prescribed.  I did recommend that he start taking the HCTZ on a daily basis.  He will monitor his blood pressures daily and record this.  Handout was provided.  He will call me if he has any blood pressures less than 110/60 or higher than 150/90.  We will plan to continue this regimen as long as he is controlled.  If he is still having blood pressures out of range, will consider adding back Norvasc 5 mg.  CMP, Lipid, TSH ordered.  He will follow-up in 2-4 weeks for blood pressure recheck and management.  Strict return precautions and reasons for emergent evaluation in the emergency department review with patient.  He voiced understanding and will follow-up as needed.   HLD (hyperlipidemia) Continue statin.  Lipid panel ordered.  Coronary atherosclerosis of native coronary artery Continue statin, BB.  Would consider switching Coreg to metoprolol if patient notices breathing difficulty since this is more selective.  However he seems to be doing well on the Coreg so we will plan to continue current regimen.  He has regular follow-up with his cardiologist.  COPD (chronic obstructive pulmonary disease) ([place]) Prior to today, I did not find this diagnosis on his problem list.  However, I do see a recent chest x-ray which did mention COPD findings radiographically.  He has been given a sample of Anoro in the past and he would  like to continue this medication.  I think that this is appropriate and a prescription was written and provided to the patient today.  He will follow-up with me with regards to COPD.  If he is unable to afford the Anoro, I advised him to call our office in the next week or 2 to see if we had samples of an alternative.  Symptoms of gastroesophageal reflux Is having intermittent upper abdominal/lower chest pain that has been present for several years.  He is interested in having H. pylori testing.  We do not have the urea breath test here but we are able to obtain a blood sample.  I discussed that this may not be the most accurate way to check for this infection if it is present.  He does not demonstrate other signs and symptoms of reflux disease but given his chronicity of the intermittent chest pain, will rule this out as a possible etiology. - H Pylori, IGM, IGG, IGA AB   Orders Placed This Encounter  Procedures  . CMP14+EGFR  . Lipid Panel  . TSH  . H Pylori, IGM, IGG, IGA AB   Meds ordered this encounter  Medications  . DISCONTD: amLODipine (NORVASC) 5 MG tablet    Sig: Take 1 tablet (5 mg total) by mouth daily.    Dispense:  30 tablet    Refill:  0  . umeclidinium-vilanterol (ANORO ELLIPTA) 62.5-25 MCG/INH AEPB    Sig: Inhale 1 puff into the lungs daily.    Dispense:  60 each    Refill:  2  . carvedilol (COREG) 6.25 MG tablet    Sig: Take 1 tablet (6.25 mg total) by mouth 2 (two) times daily with a meal.    Dispense:  180 tablet    Refill:  1  . hydrochlorothiazide (HYDRODIURIL) 25 MG tablet    Sig: Take 1 tablet (25 mg total) by mouth daily.    Dispense:  90 tablet    Refill:  0     Hector Torres  [place], [place] [phone]

## 2017-08-01 NOTE — Addendum Note (Signed)
Addended byCarrolyn Leigh on: 08/01/2017 02:54 PM   Modules accepted: Orders

## 2017-08-04 LAB — CMP14+EGFR
A/G RATIO: 1.8 (ref 1.2–2.2)
ALBUMIN: 3.8 g/dL (ref 3.5–4.8)
ALT: 14 IU/L (ref 0–44)
AST: 16 IU/L (ref 0–40)
Alkaline Phosphatase: 57 IU/L (ref 39–117)
BILIRUBIN TOTAL: 0.5 mg/dL (ref 0.0–1.2)
BUN / CREAT RATIO: 14 (ref 10–24)
BUN: 16 mg/dL (ref 8–27)
CALCIUM: 9.2 mg/dL (ref 8.6–10.2)
CHLORIDE: 105 mmol/L (ref 96–106)
CO2: 28 mmol/L (ref 20–29)
Creatinine, Ser: 1.17 mg/dL (ref 0.76–1.27)
GFR, EST AFRICAN AMERICAN: 69 mL/min/{1.73_m2} (ref >59)
GFR, EST NON AFRICAN AMERICAN: 60 mL/min/{1.73_m2} (ref >59)
Globulin, Total: 2.1 g/dL (ref 1.5–4.5)
Glucose: 89 mg/dL (ref 65–99)
POTASSIUM: 4.5 mmol/L (ref 3.5–5.2)
Sodium: 145 mmol/L — ABNORMAL HIGH (ref 134–144)
TOTAL PROTEIN: 5.9 g/dL — AB (ref 6.0–8.5)

## 2017-08-04 LAB — LIPID PANEL
CHOL/HDL RATIO: 2.7 ratio (ref 0.0–5.0)
Cholesterol, Total: 107 mg/dL (ref 100–199)
HDL: 40 mg/dL (ref >39)
LDL Calculated: 52 mg/dL (ref 0–99)
Triglycerides: 74 mg/dL (ref 0–149)
VLDL CHOLESTEROL CAL: 15 mg/dL (ref 5–40)

## 2017-08-04 LAB — TSH: TSH: 1.83 u[IU]/mL (ref 0.450–4.500)

## 2017-08-04 LAB — H PYLORI, IGM, IGG, IGA AB
H pylori, IgM Abs: <9 units (ref 0.0–8.9)
H. pylori, IgA Abs: <9 units (ref 0.0–8.9)

## 2017-08-15 ENCOUNTER — Ambulatory Visit (INDEPENDENT_AMBULATORY_CARE_PROVIDER_SITE_OTHER): Payer: Medicare HMO | Admitting: *Deleted

## 2017-08-15 VITALS — BP 157/83 | HR 66

## 2017-08-15 DIAGNOSIS — I10 Essential (primary) hypertension: Secondary | ICD-10-CM

## 2017-08-15 NOTE — Progress Notes (Signed)
Pt came in today for BP check and brought his BP readings from home but forgot to bring the monitor with him. BP in office today was 157/83 and Dr Darnell Level was shown home readings along with today's BP reading. She advised to have pt continue current medication regimen and checking his BPs at home. He was scheduled to follow up with Dr Darnell Level 08/28/17 at 1:30 and he will bring his readings, BP monitor and all of his medications that he is currently taking.

## 2017-08-20 NOTE — Progress Notes (Signed)
Subjective: CC: HTN HPI: Hector Torres is a 77 y.o. male presenting to clinic today for:  1. Hypertension Patient reports Blood pressure at home: 89-145/60-85; Meds: Compliant with Coreg, Imdur, HCTZ, Cozaar.  Side effects: none.  He notes during the time of measured hypotensive blood pressures, he does tend to feel dizzy and lightheaded.  He notes that he will often remeasure them and they are within normal limits.  Reports adequate hydration.  He sees Dr. Domenic Polite for cardiology and has an upcoming appointment.  Denies headache, dizziness, visual changes, nausea, vomiting, chest pain, LE swelling, abdominal pain or shortness of breath.  2. COPD Patient reports very good response to Anoro but he notes that the medication was extremely expensive.  He is wondering if there is an alternative.  Denies shortness of breath out of baseline.   ROS: Per HPI  Past Medical History:  Diagnosis Date  . Arthritis   . BPH (benign prostatic hypertrophy)   . Carotid artery disease (HCC)    Bilateral - Dr. Bridgett Larsson  . Colon polyp   . COPD (chronic obstructive pulmonary disease) ([place])   . Coronary atherosclerosis of native coronary artery    PTCA circumflex at [facility], nonobstructive residual disease at catheterization June and October 2016  . Essential hypertension, benign    Possible white coat  . GERD (gastroesophageal reflux disease)   . History of nephrolithiasis   . Mixed hyperlipidemia   . Prostate nodule   . PVD (peripheral vascular disease) ([place])   . TIA (transient ischemic attack)   . Vitamin D deficiency    No Known Allergies  Current Outpatient Medications:  .  aspirin 81 MG tablet, Take 81 mg by mouth daily., Disp: , Rfl:  .  carvedilol (COREG) 6.25 MG tablet, Take 1 tablet (6.25 mg total) by mouth 2 (two) times daily with a meal., Disp: 180 tablet, Rfl: 1 .  Cholecalciferol (VITAMIN D) 1000 UNITS capsule, Take 5,000 Units by mouth daily. , Disp: , Rfl:  .   diphenhydramine-acetaminophen (TYLENOL PM) 25-500 MG TABS tablet, Take 2 tablets by mouth at bedtime as needed (sleep/pain)., Disp: , Rfl:  .  hydrochlorothiazide (HYDRODIURIL) 25 MG tablet, Take 1 tablet (25 mg total) by mouth daily., Disp: 90 tablet, Rfl: 0 .  isosorbide mononitrate (IMDUR) 30 MG 24 hr tablet, TAKE 1 TABLET BY MOUTH DAILY, Disp: 90 tablet, Rfl: 3 .  Multiple Vitamin (MULTIVITAMIN PO), Take 1 tablet by mouth daily.  , Disp: , Rfl:  .  NITROSTAT 0.4 MG SL tablet, PLACE 1 TABLET UNDER THE TONGUE EVERY 5 MINUTES AS NEEDED FOR CHEST PAIN., Disp: 25 tablet, Rfl: 3 .  rosuvastatin (CRESTOR) 20 MG tablet, TAKE 1 TABLET BY MOUTH EVERY DAY, Disp: 90 tablet, Rfl: 1 .  umeclidinium-vilanterol (ANORO ELLIPTA) 62.5-25 MCG/INH AEPB, Inhale 1 puff into the lungs daily., Disp: 30 each, Rfl: 0 .  vitamin E 400 UNIT capsule, Take 400 Units by mouth daily.  , Disp: , Rfl:  Social History   Socioeconomic History  . Marital status: Widowed    Spouse name: Not on file  . Number of children: Not on file  . Years of education: Not on file  . Highest education level: Not on file  Social Needs  . Financial resource strain: Not on file  . Food insecurity - worry: Not on file  . Food insecurity - inability: Not on file  . Transportation needs - medical: Not on file  . Transportation needs - non-medical: Not  on file  Occupational History  . Not on file  Tobacco Use  . Smoking status: Former Smoker    Packs/day: 1.50    Years: 60.00    Pack years: 90.00    Types: Cigarettes    Last attempt to quit: 02/27/2011    Years since quitting: 6.4  . Smokeless tobacco: Never Used  Substance and Sexual Activity  . Alcohol use: No  . Drug use: No  . Sexual activity: Not on file  Other Topics Concern  . Not on file  Social History Narrative  . Not on file   Family History  Problem Relation Age of Onset  . Gastric cancer Mother   . Varicose Veins Mother   . Cancer Mother        Stomach  .  Prostate cancer Father   . Cancer Father        Prostate  . Other Brother        carotid artery stenosis  . Diabetes Brother   . Leukemia Brother   . Hyperlipidemia Brother   . Hypertension Brother   . Colon cancer Brother   . Hyperlipidemia Brother   . Peripheral vascular disease Brother   . Cancer Brother        Colon and Prostate  . Hypertension Sister   . Hyperlipidemia Sister   . Diabetes Daughter   . Diabetes Son   . Prostate cancer Brother     Objective: Office vital signs reviewed. BP (!) 151/85   Pulse 61   Temp (!) 97.4 F (36.3 C) (Oral)   Ht 5\' 10"  (1.778 m)   Wt 202 lb (91.6 kg)   BMI 28.98 kg/m   Physical Examination:  General: Awake, alert, well nourished, well appearing, No acute distress Cardio: RRR, S1-S2 heard.  Systolic murmur appreciated at the LSB. Pulm: Globally decreased breath sounds.  No wheezes, rhonchi's or rales.  Normal work of breathing on room air. Extremities: Warm, well-perfused, no edema.  Assessment/ Plan: 77 y.o. male   HTN (hypertension) Still not at goal.  I did consider increasing the Cozaar to 100 mg daily.  However, I am somewhat hesitant given his measured hypotensive episodes that were symptomatic.  I did discuss this with him.  He wishes to wait to see Dr. Domenic Polite as scheduled before increasing the Cozaar.  I think that this is reasonable.  He will continue to monitor blood pressure daily and keep a record of this.  He will bring this to his cardiologist at his upcoming visit.  Strict return precautions reviewed with the patient and reasons for emergent evaluation were reviewed.  Patient voiced good understanding will follow-up as needed.  COPD (chronic obstructive pulmonary disease) ([place]) Responded really well to the Anoro samples he was provided.  This medication apparently is several hundred dollars with his insurance.  I prescribed an alternative.  I did discuss with him that this class of combination inhalers may not be  affordable.  We can consider splitting the inhalers at the 2 different inhalers.  Handout was provided today.  He will contact his insurance company to see which medications are actually covered.  He will follow-up with me in the next couple of weeks for knee injections and we will follow up on the COPD as well.   Janora Norlander, DO [place] [phone]

## 2017-08-23 ENCOUNTER — Other Ambulatory Visit: Payer: Self-pay | Admitting: Cardiology

## 2017-08-28 ENCOUNTER — Ambulatory Visit (INDEPENDENT_AMBULATORY_CARE_PROVIDER_SITE_OTHER): Payer: Medicare HMO | Admitting: Family Medicine

## 2017-08-28 ENCOUNTER — Encounter: Payer: Self-pay | Admitting: Family Medicine

## 2017-08-28 VITALS — BP 151/85 | HR 61 | Temp 97.4°F | Ht 70.0 in | Wt 202.0 lb

## 2017-08-28 DIAGNOSIS — J449 Chronic obstructive pulmonary disease, unspecified: Secondary | ICD-10-CM

## 2017-08-28 DIAGNOSIS — I10 Essential (primary) hypertension: Secondary | ICD-10-CM | POA: Diagnosis not present

## 2017-08-28 MED ORDER — GLYCOPYRROLATE-FORMOTEROL 9-4.8 MCG/ACT IN AERO: 2.0000 | INHALATION_SPRAY | Freq: Two times a day (BID) | RESPIRATORY_TRACT | 0 refills | Status: DC

## 2017-08-28 MED ORDER — LOSARTAN POTASSIUM 50 MG PO TABS: 50.0000 mg | ORAL_TABLET | Freq: Every day | ORAL | 0 refills | Status: DC

## 2017-08-28 NOTE — Patient Instructions (Signed)
I sent you a new inhaler and called [employer]eves[employer].  You can take 2 puffs twice a day of this medication.  It appears that this is going to be the best covered inhaler in this class of medications.  If you find that it is too expensive, we could consider splitting the combination inhaler that you are on into 2 separate inhalers but I am unsure if this will be more cost effective or not.  I would consider calling your insurance company to see what is best covered.  Follow-up with me in 1 month for COPD or we can discuss this during your knee injection appointment.  Continue monitoring you blood pressure.  As we discussed, your blood pressure is considered elevated for your age.  Your goal blood pressure should be closer to 140/80.  Follow-up with Dr. Domenic Polite, to see if he has any further recommendations with regards to your blood pressure regimen.  I am somewhat hesitant to increase your Cozaar at this time because of the symptomatic hypotensive episode you have had.  I value your feedback and appreciate you entrusting [place] with your care.  If you get a survey, I would appreciate your taking the time to let us know what your experience was like.

## 2017-08-28 NOTE — Assessment & Plan Note (Signed)
Still not at goal.  I did consider increasing the Cozaar to 100 mg daily.  However, I am somewhat hesitant given his measured hypotensive episodes that were symptomatic.  I did discuss this with him.  He wishes to wait to see Dr. Domenic Polite as scheduled before increasing the Cozaar.  I think that this is reasonable.  He will continue to monitor blood pressure daily and keep a record of this.  He will bring this to his cardiologist at his upcoming visit.  Strict return precautions reviewed with the patient and reasons for emergent evaluation were reviewed.  Patient voiced good understanding will follow-up as needed.

## 2017-08-28 NOTE — Assessment & Plan Note (Signed)
Responded really well to the Anoro samples he was provided.  This medication apparently is several hundred dollars with his insurance.  I prescribed an alternative.  I did discuss with him that this class of combination inhalers may not be affordable.  We can consider splitting the inhalers at the 2 different inhalers.  Handout was provided today.  He will contact his insurance company to see which medications are actually covered.  He will follow-up with me in the next couple of weeks for knee injections and we will follow up on the COPD as well.

## 2017-09-01 DIAGNOSIS — N200 Calculus of kidney: Secondary | ICD-10-CM | POA: Diagnosis not present

## 2017-09-01 DIAGNOSIS — R3912 Poor urinary stream: Secondary | ICD-10-CM | POA: Diagnosis not present

## 2017-09-01 DIAGNOSIS — N401 Enlarged prostate with lower urinary tract symptoms: Secondary | ICD-10-CM | POA: Diagnosis not present

## 2017-09-10 NOTE — Progress Notes (Signed)
Subjective: CC: knee injections/ chronic knee pain PCP: Janora Norlander, DO Hector Torres is a 77 y.o. male presenting to clinic today for:  1. Chronic knee pain Patient reports a long-standing history of bilateral knee pain.  He notes the left is worse than the right.  He notes pain particularly after being seated or resting for a long amount of time.  Pain is most prominent with getting up from a seated position.  It usually gradually improves after he warms his joints up.  He takes Tylenol arthritis every night at bedtime.  Pain seems to be a little bit worse since the weather is changed.  Denies fevers, chills, increased warmth, swelling of the knee, buckling of the knees, numbness or tingling in the lower extremities or falls.  No history of knee surgeries.  No history of knee injuries.  Patient is not on any blood thinners.  No diabetes.  2.  COPD Patient reports that breathing has been doing really well on Anoro.  He notes that he return to his pharmacy and an oral was indeed covered at $45.  He is pick this up and has continue using it daily.  No shortness of breath, hemoptysis.  ROS: Per HPI  Allergies  Allergen Reactions  . Norvasc [Amlodipine Besylate] Swelling    LE swelling   Past Medical History:  Diagnosis Date  . Arthritis   . BPH (benign prostatic hypertrophy)   . Carotid artery disease (HCC)    Bilateral - Dr. Bridgett Larsson  . Colon polyp   . COPD (chronic obstructive pulmonary disease) ([place])   . Coronary atherosclerosis of native coronary artery    PTCA circumflex at [facility], nonobstructive residual disease at catheterization June and October 2016  . Essential hypertension, benign    Possible white coat  . GERD (gastroesophageal reflux disease)   . History of nephrolithiasis   . Mixed hyperlipidemia   . Prostate nodule   . PVD (peripheral vascular disease) ([place])   . TIA (transient ischemic attack)   . Vitamin D deficiency     Current Outpatient  Medications:  .  aspirin 81 MG tablet, Take 81 mg by mouth daily., Disp: , Rfl:  .  carvedilol (COREG) 6.25 MG tablet, Take 1 tablet (6.25 mg total) by mouth 2 (two) times daily with a meal., Disp: 180 tablet, Rfl: 1 .  Cholecalciferol (VITAMIN D) 1000 UNITS capsule, Take 1,000 Units by mouth daily. , Disp: , Rfl:  .  diphenhydramine-acetaminophen (TYLENOL PM) 25-500 MG TABS tablet, Take 2 tablets by mouth at bedtime as needed (sleep/pain)., Disp: , Rfl:  .  Glycopyrrolate-Formoterol (BEVESPI AEROSPHERE) 9-4.8 MCG/ACT AERO, Inhale 2 puffs into the lungs 2 (two) times daily., Disp: 1 Inhaler, Rfl: 0 .  hydrochlorothiazide (HYDRODIURIL) 25 MG tablet, Take 1 tablet (25 mg total) by mouth daily., Disp: 90 tablet, Rfl: 0 .  isosorbide mononitrate (IMDUR) 30 MG 24 hr tablet, TAKE 1 TABLET BY MOUTH DAILY, Disp: 90 tablet, Rfl: 3 .  losartan (COZAAR) 50 MG tablet, Take 1 tablet (50 mg total) by mouth daily., Disp: 90 tablet, Rfl: 0 .  Multiple Vitamin (MULTIVITAMIN PO), Take 1 tablet by mouth daily.  , Disp: , Rfl:  .  NITROSTAT 0.4 MG SL tablet, PLACE 1 TABLET UNDER THE TONGUE EVERY 5 MINUTES AS NEEDED FOR CHEST PAIN., Disp: 25 tablet, Rfl: 3 .  rosuvastatin (CRESTOR) 20 MG tablet, TAKE 1 TABLET BY MOUTH EVERY DAY, Disp: 90 tablet, Rfl: 1 .  tamsulosin (FLOMAX) 0.4  MG CAPS capsule, Take 0.4 mg by mouth daily., Disp: , Rfl:  .  vitamin E 400 UNIT capsule, Take 400 Units by mouth daily.  , Disp: , Rfl:  Social History   Socioeconomic History  . Marital status: Widowed    Spouse name: Not on file  . Number of children: Not on file  . Years of education: Not on file  . Highest education level: Not on file  Social Needs  . Financial resource strain: Not on file  . Food insecurity - worry: Not on file  . Food insecurity - inability: Not on file  . Transportation needs - medical: Not on file  . Transportation needs - non-medical: Not on file  Occupational History  . Not on file  Tobacco Use  .  Smoking status: Former Smoker    Packs/day: 1.50    Years: 60.00    Pack years: 90.00    Types: Cigarettes    Last attempt to quit: 02/27/2011    Years since quitting: 6.5  . Smokeless tobacco: Never Used  Substance and Sexual Activity  . Alcohol use: No  . Drug use: No  . Sexual activity: Not on file  Other Topics Concern  . Not on file  Social History Narrative  . Not on file   Family History  Problem Relation Age of Onset  . Gastric cancer Mother   . Varicose Veins Mother   . Cancer Mother        Stomach  . Prostate cancer Father   . Cancer Father        Prostate  . Other Brother        carotid artery stenosis  . Diabetes Brother   . Leukemia Brother   . Hyperlipidemia Brother   . Hypertension Brother   . Colon cancer Brother   . Hyperlipidemia Brother   . Peripheral vascular disease Brother   . Cancer Brother        Colon and Prostate  . Hypertension Sister   . Hyperlipidemia Sister   . Diabetes Daughter   . Diabetes Son   . Prostate cancer Brother     Objective: Office vital signs reviewed. BP 140/87   Pulse 64   Temp 97.6 F (36.4 C) (Oral)   Ht 5\' 10"  (1.778 m)   Wt 204 lb (92.5 kg)   BMI 29.27 kg/m   Physical Examination:  General: Awake, alert, well nourished, ell appearing No acute distress Cardio: regular rate and rhythm, S1S2 heard, no murmurs appreciated Pulm: Globally decreased breath sounds.  Otherwise , clear to auscultation bilaterally, no wheezes, rhonchi or rales; normal work of breathing on room air Extremities: warm, well perfused, No edema, cyanosis or clubbing; +2 pulses bilaterally  Left knee: Patient has full active range of motion.  He has visible bony abnormalities consistent with osteoarthritis.  No patellar tenderness to palpation.  No joint line tenderness to palpation.  No tenderness to the quads or patellar tendons.  No palpable abnormalities in the posterior popliteal fossa.  No ligamentous laxity. MSK: normal gait and  normal station Skin: dry; intact; no rashes or lesions Neuro: 5/5 LE Strength and light touch sensation grossly intact  JOINT INJECTION:  Patient denies allergy to antiseptics (including iodine) and anesthetics  Patient denies h/o diabetes, frequent steroid use, use of blood thinners/ antiplatelets.  Patient was given informed consent and a signed copy has been placed in the chart. Appropriate time out was taken. Area prepped and draped in usual sterile  fashion. Anatomic landmarks were identified and injection site was marked.  Ethyl chloride spray was used to numb the area and 1 cc of methylprednisolone 40 mg/ml plus  3 cc of 1% lidocaine without epinephrine was injected into the left knee using a(n) anteriolateral approach. The patient tolerated the procedure well and there were no immediate complications. Estimated blood loss is less than 5 cc.  Post procedure instructions were reviewed and handout outlining these instructions were provided to patient.   Assessment/ Plan: 77 y.o. male   1. Chronic pain of left knee Risks and benefits of intra-articular injection reviewed with the patient.  Patient wished to proceed with corticosteroid injection.  Signed consent obtained and placed into the file.  He tolerated procedure without difficulty.  See above procedure note.  Handout provided with aftercare instructions.  Patient to follow-up as needed.  2. Chronic obstructive pulmonary disease, unspecified COPD type ([place]) Well-controlled with Anoro.  Follow-up as needed.    Janora Norlander, DO [place] [phone]

## 2017-09-12 ENCOUNTER — Ambulatory Visit (INDEPENDENT_AMBULATORY_CARE_PROVIDER_SITE_OTHER): Payer: Medicare HMO | Admitting: Family Medicine

## 2017-09-12 ENCOUNTER — Encounter: Payer: Self-pay | Admitting: Family Medicine

## 2017-09-12 VITALS — BP 140/87 | HR 64 | Temp 97.6°F | Ht 70.0 in | Wt 204.0 lb

## 2017-09-12 DIAGNOSIS — J449 Chronic obstructive pulmonary disease, unspecified: Secondary | ICD-10-CM | POA: Diagnosis not present

## 2017-09-12 DIAGNOSIS — M25562 Pain in left knee: Secondary | ICD-10-CM

## 2017-09-12 DIAGNOSIS — G8929 Other chronic pain: Secondary | ICD-10-CM | POA: Diagnosis not present

## 2017-09-12 MED ORDER — METHYLPREDNISOLONE ACETATE 80 MG/ML IJ SUSP
40.0000 mg | Freq: Once | INTRAMUSCULAR | Status: AC
  Administered 2017-09-12: 40 mg via INTRA_ARTICULAR

## 2017-09-12 NOTE — Patient Instructions (Signed)
I value your feedback and appreciate you entrusting [place] with your care.  If you get a survey, I would appreciate your taking the time to let us know what your experience was like.  Knee Injection, Care After Refer to this sheet in the next few weeks. These instructions provide you with information about caring for yourself after your procedure. Your health care provider may also give you more specific instructions. Your treatment has been planned according to current medical practices, but problems sometimes occur. Call your health care provider if you have any problems or questions after your procedure. What can I expect after the procedure? After the procedure, it is common to have:  Soreness.  Warmth.  Swelling.  You may have more pain, swelling, and warmth than you did before the injection. This reaction may last for about one day. Follow these instructions at home: Bathing  If you were given a bandage (dressing), keep it dry until your health care provider says it can be removed. Ask your health care provider when you can start showering or taking a bath. Managing pain, stiffness, and swelling  If directed, apply ice to the injection area: ? Put ice in a plastic bag. ? Place a towel between your skin and the bag. ? Leave the ice on for 20 minutes, 2-3 times per day.  Do not apply heat to your knee.  Raise the injection area above the level of your heart while you are sitting or lying down. Activity  Avoid strenuous activities for as long as directed by your health care provider. Ask your health care provider when you can return to your normal activities. General instructions  Take medicines only as directed by your health care provider.  Do not take aspirin or other over-the-counter medicines unless your health care provider says you can.  Check your injection site every day for signs of infection. Watch for: ? Redness, swelling, or pain. ? Fluid, blood, or pus.  Follow  your health care provider's instructions about dressing changes and removal. Contact a health care provider if:  You have symptoms at your injection site that last longer than two days after your procedure.  You have redness, swelling, or pain in your injection area.  You have fluid, blood, or pus coming from your injection site.  You have warmth in your injection area.  You have a fever.  Your pain is not controlled with medicine. Get help right away if:  Your knee turns very red.  Your knee becomes very swollen.  Your knee pain is severe. This information is not intended to replace advice given to you by your health care provider. Make sure you discuss any questions you have with your health care provider. Document Released: 08/26/2014 Document Revised: 04/10/2016 Document Reviewed: 06/15/2014 Elsevier Interactive Patient Education  [employer].

## 2017-09-12 NOTE — Addendum Note (Signed)
Addended byCarrolyn Leigh on: 09/12/2017 03:40 PM   Modules accepted: Orders

## 2017-09-30 ENCOUNTER — Other Ambulatory Visit: Payer: Self-pay | Admitting: Family Medicine

## 2017-11-03 ENCOUNTER — Other Ambulatory Visit: Payer: Self-pay | Admitting: Family Medicine

## 2017-11-03 MED ORDER — UMECLIDINIUM-VILANTEROL 62.5-25 MCG/INH IN AEPB: INHALATION_SPRAY | RESPIRATORY_TRACT | 0 refills | Status: DC

## 2017-11-03 NOTE — Telephone Encounter (Signed)
Pt aware refill sent to pharmacy 

## 2017-11-20 ENCOUNTER — Other Ambulatory Visit: Payer: Self-pay | Admitting: Family

## 2017-11-20 ENCOUNTER — Other Ambulatory Visit: Payer: Self-pay | Admitting: Family Medicine

## 2017-11-20 DIAGNOSIS — E785 Hyperlipidemia, unspecified: Secondary | ICD-10-CM

## 2017-12-11 ENCOUNTER — Other Ambulatory Visit: Payer: Self-pay | Admitting: Family Medicine

## 2018-01-01 ENCOUNTER — Telehealth: Payer: Self-pay | Admitting: Family Medicine

## 2018-01-01 NOTE — Telephone Encounter (Signed)
Pt wanted to switch pharmacy to [facility] and to see if his HCTZ could be increased from 25mg  to 50mg . Advised pt we couldn't change medication without him being seen and pt has appt tomorrow and will discuss with Dr. Lajuana Ripple. Pharmacy changed to [facility] and pt is aware.

## 2018-01-02 ENCOUNTER — Encounter: Payer: Self-pay | Admitting: Family Medicine

## 2018-01-02 ENCOUNTER — Ambulatory Visit (INDEPENDENT_AMBULATORY_CARE_PROVIDER_SITE_OTHER): Payer: Medicare HMO | Admitting: Family Medicine

## 2018-01-02 VITALS — BP 138/68 | HR 72 | Temp 97.8°F | Ht 70.0 in | Wt 209.0 lb

## 2018-01-02 DIAGNOSIS — M25562 Pain in left knee: Secondary | ICD-10-CM

## 2018-01-02 DIAGNOSIS — E782 Mixed hyperlipidemia: Secondary | ICD-10-CM | POA: Diagnosis not present

## 2018-01-02 DIAGNOSIS — G8929 Other chronic pain: Secondary | ICD-10-CM

## 2018-01-02 DIAGNOSIS — M25561 Pain in right knee: Secondary | ICD-10-CM | POA: Diagnosis not present

## 2018-01-02 MED ORDER — HYDROCHLOROTHIAZIDE 25 MG PO TABS: 25.0000 mg | ORAL_TABLET | Freq: Every day | ORAL | 4 refills | Status: DC

## 2018-01-02 MED ORDER — CARVEDILOL 6.25 MG PO TABS: 6.2500 mg | ORAL_TABLET | Freq: Two times a day (BID) | ORAL | 4 refills | Status: DC

## 2018-01-02 MED ORDER — UMECLIDINIUM-VILANTEROL 62.5-25 MCG/INH IN AEPB: INHALATION_SPRAY | RESPIRATORY_TRACT | 4 refills | Status: DC

## 2018-01-02 MED ORDER — ROSUVASTATIN CALCIUM 20 MG PO TABS: 20.0000 mg | ORAL_TABLET | Freq: Every day | ORAL | 4 refills | Status: DC

## 2018-01-02 NOTE — Progress Notes (Signed)
Subjective: CC: Left knee pain HPI: Hector Torres is a 77 y.o. male presenting to clinic today for:  1. Left knee pain Patient with chronic bilateral knee pain.  Left is typically worse than right.  Pain is worsened by rest and when getting up from a sitting position.  Warming the joints, physical activity seems to improve pain.  Patient was last treated in January with a corticosteroid injection to the left knee.  He reports that he did not notice a great deal of improvement in his knee pain after the last injection.  He does wish to try it again however today.  He notes that his pain in the left knee seem to be aggravated because he played golf earlier.  Denies any joint swelling, discoloration.  No increased warmth.  ROS: Per HPI  Past Medical History:  Diagnosis Date  . Arthritis   . BPH (benign prostatic hypertrophy)   . Carotid artery disease (HCC)    Bilateral - Dr. Bridgett Larsson  . Colon polyp   . COPD (chronic obstructive pulmonary disease) ([place])   . Coronary atherosclerosis of native coronary artery    PTCA circumflex at [facility], nonobstructive residual disease at catheterization June and October 2016  . Essential hypertension, benign    Possible white coat  . GERD (gastroesophageal reflux disease)   . History of nephrolithiasis   . Mixed hyperlipidemia   . Prostate nodule   . PVD (peripheral vascular disease) ([place])   . TIA (transient ischemic attack)   . Vitamin D deficiency    Allergies  Allergen Reactions  . Norvasc [Amlodipine Besylate] Swelling    LE swelling    Current Outpatient Medications:  .  ANORO ELLIPTA 62.5-25 MCG/INH AEPB, INHALE 1 PUFF INTO LUNGS DAILY, Disp: 60 each, Rfl: 0 .  aspirin 81 MG tablet, Take 81 mg by mouth daily., Disp: , Rfl:  .  carvedilol (COREG) 6.25 MG tablet, Take 1 tablet (6.25 mg total) by mouth 2 (two) times daily with a meal., Disp: 180 tablet, Rfl: 1 .  Cholecalciferol (VITAMIN D) 1000 UNITS capsule, Take 1,000 Units by mouth  daily. , Disp: , Rfl:  .  diphenhydramine-acetaminophen (TYLENOL PM) 25-500 MG TABS tablet, Take 2 tablets by mouth at bedtime as needed (sleep/pain)., Disp: , Rfl:  .  hydrochlorothiazide (HYDRODIURIL) 25 MG tablet, Take 1 tablet (25 mg total) by mouth daily., Disp: 90 tablet, Rfl: 0 .  isosorbide mononitrate (IMDUR) 30 MG 24 hr tablet, TAKE 1 TABLET BY MOUTH DAILY, Disp: 90 tablet, Rfl: 3 .  losartan (COZAAR) 50 MG tablet, TAKE 1 TABLET BY MOUTH EVERY DAY, Disp: 90 tablet, Rfl: 0 .  Multiple Vitamin (MULTIVITAMIN PO), Take 1 tablet by mouth daily.  , Disp: , Rfl:  .  rosuvastatin (CRESTOR) 20 MG tablet, TAKE 1 TABLET BY MOUTH EVERY DAY, Disp: 90 tablet, Rfl: 0 .  vitamin E 400 UNIT capsule, Take 400 Units by mouth daily.  , Disp: , Rfl:  Social History   Socioeconomic History  . Marital status: Widowed    Spouse name: Not on file  . Number of children: Not on file  . Years of education: Not on file  . Highest education level: Not on file  Occupational History  . Not on file  Social Needs  . Financial resource strain: Not on file  . Food insecurity:    Worry: Not on file    Inability: Not on file  . Transportation needs:    Medical: Not on  file    Non-medical: Not on file  Tobacco Use  . Smoking status: Former Smoker    Packs/day: 1.50    Years: 60.00    Pack years: 90.00    Types: Cigarettes    Last attempt to quit: 02/27/2011    Years since quitting: 6.8  . Smokeless tobacco: Never Used  Substance and Sexual Activity  . Alcohol use: No  . Drug use: No  . Sexual activity: Not on file  Lifestyle  . Physical activity:    Days per week: Not on file    Minutes per session: Not on file  . Stress: Not on file  Relationships  . Social connections:    Talks on phone: Not on file    Gets together: Not on file    Attends religious service: Not on file    Active member of club or organization: Not on file    Attends meetings of clubs or organizations: Not on file     Relationship status: Not on file  . Intimate partner violence:    Fear of current or ex partner: Not on file    Emotionally abused: Not on file    Physically abused: Not on file    Forced sexual activity: Not on file  Other Topics Concern  . Not on file  Social History Narrative  . Not on file   Family History  Problem Relation Age of Onset  . Gastric cancer Mother   . Varicose Veins Mother   . Cancer Mother        Stomach  . Prostate cancer Father   . Cancer Father        Prostate  . Other Brother        carotid artery stenosis  . Diabetes Brother   . Leukemia Brother   . Hyperlipidemia Brother   . Hypertension Brother   . Colon cancer Brother   . Hyperlipidemia Brother   . Peripheral vascular disease Brother   . Cancer Brother        Colon and Prostate  . Hypertension Sister   . Hyperlipidemia Sister   . Diabetes Daughter   . Diabetes Son   . Prostate cancer Brother     Objective: Office vital signs reviewed. BP 138/68   Pulse 72   Temp 97.8 F (36.6 C) (Oral)   Ht '5\' 10"'  (1.778 m)   Wt 209 lb (94.8 kg)   BMI 29.99 kg/m   Physical Examination:  General: Awake, alert, wel nourished, No acute distress Extremities: warm, well perfused, No edema, cyanosis or clubbing; +2 pulses bilaterally MSK: normal gait and normal station   Left knee: No gross effusion, joint swelling or discoloration appreciated.  He has full active range of motion.  No patellar tenderness, patellar tendon tenderness, quads tenderness to palpation.  No joint line tenderness to palpation.  No palpable posterior popliteal abnormalities or pain.  No ligamentous laxity. Skin: dry; intact; no rashes or lesions Neuro: Light touch sensation grossly intact.  JOINT INJECTION:  Patient denies allergy to antiseptics (including iodine) and anesthetics.  Patient denies h/o diabetes, frequent steroid use, use of blood thinners/ antiplatelets.  Patient was given informed consent and a signed copy has  been placed in the chart. Appropriate time out was taken. Area prepped and draped in usual sterile fashion. Anatomic landmarks were identified and injection site was marked.  Ethyl chloride spray was used to numb the area and 1 cc of methylprednisolone 40 mg/ml plus  3  cc of 1% lidocaine without epinephrine was injected into the left knee using a(n) lateral approach.  Upon insertion, bone was met and the needle was withdrawn slightly and redirected.  Needle was able to be inserted fully and medication administered.  The patient tolerated the procedure well and there were no immediate complications. Estimated blood loss is less than 1 cc.  Post procedure instructions were reviewed and handout outlining these instructions were provided to patient.   Assessment/ Plan: 77 y.o. male   1. Chronic pain of both knees Likely arthritic in nature.  We proceeded with repeat corticosteroid injection to the left knee today.  Patient did tolerate procedure without difficulty.  Home care instructions were reviewed with patient.  He does wish to see an orthopedist for consideration of Visco supplementation and possible alternative therapies if symptoms do not improve after today's corticosteroid injection.  Follow-up as needed. - Ambulatory referral to [place], [place] [phone]

## 2018-01-04 NOTE — Progress Notes (Signed)
Cardiology Office Note  Date: 01/05/2018   ID: Hector Torres, DOB 08-30-1940, MRN [id number]  PCP: Janora Norlander, DO  Primary Cardiologist: Rozann Lesches, MD   Chief Complaint  Patient presents with  . Coronary Artery Disease    History of Present Illness: Hector Torres is a 77 y.o. male last seen in March 2018.  He presents for a routine follow-up visit.  States that in the meanwhile he has stopped losartan, felt like it was causing chest pain.  His blood pressure has not been optimally controlled, we discussed some of his home checks.  He does not report any obvious angina now, and has stable NYHA class II dyspnea.  He is due for follow-up Carotid Dopplers.  Continues on aspirin and statin therapy.  We reviewed his medications and discussed trying a different antihypertensive, Avapro 75 mg daily will be initiated with follow-up BMET.   I personally reviewed his ECG today which shows sinus rhythm with nonspecific ST-T changes.  I reviewed his lab work from December 2018.  Past Medical History:  Diagnosis Date  . Arthritis   . BPH (benign prostatic hypertrophy)   . Carotid artery disease (HCC)    Bilateral - Dr. Bridgett Larsson  . Colon polyp   . COPD (chronic obstructive pulmonary disease) ([place])   . Coronary atherosclerosis of native coronary artery    [facility]CA circumflex at [facility], nonobstructive residual disease at catheterization June and October 2016  . Essential hypertension, benign    Possible white coat  . GERD (gastroesophageal reflux disease)   . History of nephrolithiasis   . Mixed hyperlipidemia   . Prostate nodule   . PVD (peripheral vascular disease) ([place])   . TIA (transient ischemic attack)   . Vitamin D deficiency     Past Surgical History:  Procedure Laterality Date  . ANGIOPLASTY Left 1995   Cir. Artery  . APPENDECTOMY  1947  . CARDIAC CATHETERIZATION N/A 02/03/2015   Procedure: Left Heart Cath and Cors/Grafts Angiography;  Surgeon: Sherren Mocha,  MD;  Location: [facility];  Service: Cardiovascular;  Laterality: N/A;  . CARDIAC CATHETERIZATION N/A 05/19/2015   Procedure: Left Heart Cath and Coronary Angiography;  Surgeon: Troy Sine, MD;  Location: [facility];  Service: Cardiovascular;  Laterality: N/A;  . EYE SURGERY     Bilateral cataract  . TONSILLECTOMY  1968  . Traumatic injury  1962   Lost tip of index, middle, and ring finger/ right hand    Current Outpatient Medications  Medication Sig Dispense Refill  . aspirin 81 MG tablet Take 81 mg by mouth daily.    . carvedilol (COREG) 6.25 MG tablet Take 1 tablet (6.25 mg total) by mouth 2 (two) times daily with a meal. 180 tablet 4  . Cholecalciferol (VITAMIN D) 1000 UNITS capsule Take 1,000 Units by mouth daily.     . diphenhydramine-acetaminophen (TYLENOL PM) 25-500 MG TABS tablet Take 2 tablets by mouth at bedtime as needed (sleep/pain).    . hydrochlorothiazide (HYDRODIURIL) 25 MG tablet Take 1 tablet (25 mg total) by mouth daily. 90 tablet 4  . isosorbide mononitrate (IMDUR) 30 MG 24 hr tablet TAKE 1 TABLET BY MOUTH DAILY 90 tablet 3  . Multiple Vitamin (MULTIVITAMIN PO) Take 1 tablet by mouth daily.      . rosuvastatin (CRESTOR) 20 MG tablet Take 1 tablet (20 mg total) by mouth daily. 90 tablet 4  . umeclidinium-vilanterol (ANORO ELLIPTA) 62.5-25 MCG/INH AEPB INHALE 1 PUFF INTO  LUNGS DAILY 180 each 4  . vitamin E 400 UNIT capsule Take 400 Units by mouth daily.       No current facility-administered medications for this visit.    Allergies:  Norvasc [amlodipine besylate]   Social History: The patient  reports that he quit smoking about 6 years ago. His smoking use included cigarettes. He has a 90.00 pack-year smoking history. He has never used smokeless tobacco. He reports that he does not drink alcohol or use drugs.   ROS:  Please see the history of present illness. Otherwise, complete review of systems is positive for arthritic pains.  All other systems  are reviewed and negative.   Physical Exam: VS:  BP (!) 190/93   Pulse 64   Ht 5\' 10"  (1.778 m)   Wt 209 lb 12.8 oz (95.2 kg)   SpO2 98%   BMI 30.10 kg/m , BMI Body mass index is 30.1 kg/m.  Wt Readings from Last 3 Encounters:  01/05/18 209 lb 12.8 oz (95.2 kg)  01/02/18 209 lb (94.8 kg)  09/12/17 204 lb (92.5 kg)    General: Elderly male, appears comfortable at rest. HEENT: Conjunctiva and lids normal, oropharynx clear. Neck: Supple, no elevated JVP or carotid bruits, no thyromegaly. Lungs: Clear to auscultation, nonlabored breathing at rest. Cardiac: Regular rate and rhythm, no S3, 2/6 systolic murmur. Abdomen: Soft, nontender, bowel sounds present, no guarding or rebound. Extremities: No pitting edema, distal pulses 2+. Skin: Warm and dry. Musculoskeletal: No kyphosis. Neuropsychiatric: Alert and oriented x3, affect grossly appropriate.  ECG: I personally reviewed the tracing from 04/05/2016 which showed sinus rhythm with nonspecific ST-T changes.  Recent Labwork: 08/01/2017: ALT 14; AST 16; BUN 16; Creatinine, Ser 1.17; Potassium 4.5; Sodium 145; TSH 1.830     Component Value Date/Time   CHOL 107 08/01/2017 1159   CHOL 102 12/25/2012 1251   TRIG 74 08/01/2017 1159   TRIG 82 12/25/2012 1251   HDL 40 08/01/2017 1159   HDL 36 (L) 12/25/2012 1251   CHOLHDL 2.7 08/01/2017 1159   LDLCALC 52 08/01/2017 1159   LDLCALC 50 12/25/2012 1251    Other Studies Reviewed Today:  Cardiac catheterization 05/19/2015:  1st Diag lesion, 50% stenosed.  Prox LAD lesion, 20% stenosed.  Prox Cx lesion, 30% stenosed.  Mid Cx lesion, 20% stenosed.  Dist Cx lesion, 20% stenosed.  The left ventricular systolic function is normal.  Hyperdynamic LV function with an ejection fraction of 70-75% with evidence for left ventricular hypertrophy without focal segmental wall motion abnormalities.  No significant coronary obstructive disease with mild 20% luminal narrowing in the proximal  LAD, 50% ostial stenosis in a diminutive first diagonal branch; scattered smooth 30% proximal, 20% percent mid, and 20% distal left circumflex narrowing in a dominant left circumflex vessel and a small nondominant RCA.  Carotid Dopplers 11/20/2016: RICA with 2-95% stenosis and LICA with 18-84% stenosis.  Assessment and Plan:  1.  CAD with history of remote circumflex angioplasty, overall nonobstructive coronary atherosclerosis at cardiac catheterization in 2016.  Continue medical therapy including aspirin and statin.  He also remains on nitrate.  2.  Carotid artery disease, follow-up Dopplers will be obtained.  Continue aspirin and statin.  He is asymptomatic.  3.  Essential hypertension, blood pressure is not optimally controlled.  He stopped losartan concerned about possible side effects.  We will try Avapro at 75 mg daily, BMET in 2 weeks.  4.  Mixed hyperlipidemia, he continues on Crestor without intolerances.  LDL 52.  Current medicines were reviewed with the patient today.   Orders Placed This Encounter  Procedures  . EKG 12-Lead    Disposition: Follow-up in 1 year.  Signed, Satira Sark, MD, [facility] 01/05/2018 4:27 PM    [place] [facility] [place], [place], [place] [address] Phone: [phone]; Fax: [phone]

## 2018-01-05 ENCOUNTER — Encounter: Payer: Self-pay | Admitting: Cardiology

## 2018-01-05 ENCOUNTER — Telehealth: Payer: Self-pay | Admitting: Cardiology

## 2018-01-05 ENCOUNTER — Ambulatory Visit (INDEPENDENT_AMBULATORY_CARE_PROVIDER_SITE_OTHER): Payer: Medicare HMO | Admitting: Cardiology

## 2018-01-05 VITALS — BP 190/93 | HR 64 | Ht 70.0 in | Wt 209.8 lb

## 2018-01-05 DIAGNOSIS — I6523 Occlusion and stenosis of bilateral carotid arteries: Secondary | ICD-10-CM

## 2018-01-05 DIAGNOSIS — I25119 Atherosclerotic heart disease of native coronary artery with unspecified angina pectoris: Secondary | ICD-10-CM | POA: Diagnosis not present

## 2018-01-05 DIAGNOSIS — I10 Essential (primary) hypertension: Secondary | ICD-10-CM | POA: Diagnosis not present

## 2018-01-05 DIAGNOSIS — E782 Mixed hyperlipidemia: Secondary | ICD-10-CM | POA: Diagnosis not present

## 2018-01-05 MED ORDER — ISOSORBIDE MONONITRATE ER 30 MG PO TB24: 30.0000 mg | ORAL_TABLET | Freq: Every day | ORAL | 1 refills | Status: DC

## 2018-01-05 MED ORDER — IRBESARTAN 75 MG PO TABS: 75.0000 mg | ORAL_TABLET | Freq: Every day | ORAL | 1 refills | Status: DC

## 2018-01-05 NOTE — Patient Instructions (Addendum)
Your physician wants you to follow-up in: [place] will receive a reminder letter in the mail two months in advance. If you don't receive a letter, please call our office to schedule the follow-up appointment.  Your physician has recommended you make the following change in your medication:   START AVAPRO 75 [place]   Your physician recommends that you return for lab work in: [place] has requested that you have a carotid duplex. This test is an ultrasound of the carotid arteries in your neck. It looks at blood flow through these arteries that supply the brain with blood. Allow one hour for this exam. There are no restrictions or special instructions.   Thank you for choosing [facility]!!

## 2018-01-05 NOTE — Telephone Encounter (Signed)
Carotid schedule in [facility] Jan 07, 2018

## 2018-01-07 ENCOUNTER — Ambulatory Visit (INDEPENDENT_AMBULATORY_CARE_PROVIDER_SITE_OTHER): Payer: Medicare HMO

## 2018-01-07 DIAGNOSIS — I6523 Occlusion and stenosis of bilateral carotid arteries: Secondary | ICD-10-CM | POA: Diagnosis not present

## 2018-01-07 MED ORDER — METHYLPREDNISOLONE ACETATE 80 MG/ML IJ SUSP: 40.0000 mg | Freq: Once | INTRAMUSCULAR | Status: DC

## 2018-01-07 NOTE — Addendum Note (Signed)
Addended byCarrolyn Leigh on: 01/07/2018 03:11 PM   Modules accepted: Orders

## 2018-01-08 ENCOUNTER — Telehealth: Payer: Self-pay

## 2018-01-08 NOTE — Telephone Encounter (Signed)
-----   Message from Satira Sark, MD sent at 01/07/2018  3:55 PM EDT ----- Results reviewed.  Carotid Dopplers show 1 to 39% RICA stenosis and 40 to 30% LICA stenosis.  Also stable degree of left subclavian stenosis.  He is asymptomatic and would continue with medical therapy for now. A copy of this test should be forwarded to Janora Norlander, DO.

## 2018-01-08 NOTE — Telephone Encounter (Signed)
Patient notified. Routed to PCP 

## 2018-01-14 ENCOUNTER — Ambulatory Visit (INDEPENDENT_AMBULATORY_CARE_PROVIDER_SITE_OTHER): Payer: Medicare HMO | Admitting: Orthopaedic Surgery

## 2018-01-14 ENCOUNTER — Ambulatory Visit (INDEPENDENT_AMBULATORY_CARE_PROVIDER_SITE_OTHER): Payer: Medicare HMO

## 2018-01-14 ENCOUNTER — Ambulatory Visit (INDEPENDENT_AMBULATORY_CARE_PROVIDER_SITE_OTHER): Payer: Self-pay

## 2018-01-14 ENCOUNTER — Encounter (INDEPENDENT_AMBULATORY_CARE_PROVIDER_SITE_OTHER): Payer: Self-pay | Admitting: Orthopaedic Surgery

## 2018-01-14 VITALS — BP 92/44 | HR 62 | Ht 70.0 in | Wt 209.0 lb

## 2018-01-14 DIAGNOSIS — M170 Bilateral primary osteoarthritis of knee: Secondary | ICD-10-CM | POA: Diagnosis not present

## 2018-01-14 DIAGNOSIS — M25562 Pain in left knee: Secondary | ICD-10-CM

## 2018-01-14 DIAGNOSIS — G8929 Other chronic pain: Secondary | ICD-10-CM

## 2018-01-14 DIAGNOSIS — M25561 Pain in right knee: Secondary | ICD-10-CM

## 2018-01-14 MED ORDER — HYALURONAN 30 MG/2ML IX SOSY
30.0000 mg | PREFILLED_SYRINGE | INTRA_ARTICULAR | Status: AC | PRN
  Administered 2018-01-14: 30 mg via INTRA_ARTICULAR

## 2018-01-14 NOTE — Progress Notes (Signed)
Office Visit Note   Patient: Hector Torres           Date of Birth: July 22, 1941           MRN: [id number] Visit Date: 01/14/2018              Requested by: Janora Norlander, DO [address] [place], [place] [address] PCP: Janora Norlander, DO   Assessment & Plan: Visit Diagnoses:  1. Chronic pain of left knee   2. Chronic pain of right knee     Plan: Advanced osteoarthritis both knees.  More symptomatic on the left.  Long discussion regarding diagnosis and treatment options.  Try NSAIDs.  Visco supplementation left knee with Orthovisc return weekly for the next 2 weeks to complete the series.  This visit approximately 45 minutes 50% of the time in counseling regarding his diagnosis and treatment options. Follow-Up Instructions: Return in about 1 week (around 01/21/2018).   Orders:  Orders Placed This Encounter  Procedures  . Large Joint Inj: L knee  . XR KNEE 3 VIEW LEFT  . XR KNEE 3 VIEW RIGHT   No orders of the defined types were placed in this encounter.     Procedures: Large Joint Inj: L knee on 01/14/2018 11:09 AM Indications: pain and joint swelling Details: 25 G 1.5 in needle, anteromedial approach  Arthrogram: No  Medications: 30 mg Hyaluronan 30 MG/2ML Outcome: tolerated well, no immediate complications Procedure, treatment alternatives, risks and benefits explained, specific risks discussed. Consent was given by the patient. Immediately prior to procedure a time out was called to verify the correct patient, procedure, equipment, support staff and site/side marked as required. Patient was prepped and draped in the usual sterile fashion.       Clinical Data: No additional findings.   Subjective: Chief Complaint  Patient presents with  . Right Knee - Pain  . Left Knee - Pain  . Follow-up    bi lat knee pain for years gotten worse last 3 mo. no injury, gives away, hard to get up. tried 2 injections no relief wakes him at night   Hector Torres is a 77 years  old visit the office for evaluation of bilateral knee pain.  Had trouble for "years" but worse over the past 3 months.  No injury or trauma.  His knee pain will oftentimes awaken him at night.  He has a feeling of his knee giving way.  He also has some difficulty getting up from a sitting position.  After being up for a few minutes and walking he is "okay".  Sometimes he has an effusion.  Symptoms are worse on the left than the right knee.  He has had 2 injections in his left knee through his primary care physician over the last several months without "much help".  He  takes Tylenol PM and Aleve to "help with my pain"..  He still remains active.  He is widowed and goes to the senior center.  He also plays golf.  HPI  Review of Systems  Constitutional: Positive for fatigue.  HENT: Negative for ear pain.   Eyes: Negative for pain.  Cardiovascular: Positive for leg swelling.  Gastrointestinal: Negative for diarrhea.  Genitourinary: Negative for difficulty urinating.  Musculoskeletal: Negative for back pain and neck pain.  Skin: Negative for rash.  Allergic/Immunologic: Negative for food allergies.  Neurological: Positive for weakness. Negative for numbness.  Hematological: Bruises/bleeds easily.  Psychiatric/Behavioral: Positive for sleep disturbance.  Objective: Vital Signs: BP (!) 92/44 (BP Location: Left Arm, Patient Position: Sitting, Cuff Size: Normal)   Pulse 62   Ht 5\' 10"  (1.778 m)   Wt 209 lb (94.8 kg)   BMI 29.99 kg/m   Physical Exam  Constitutional: He is oriented to person, place, and time. He appears well-developed and well-nourished.  HENT:  Mouth/Throat: Oropharynx is clear and moist.  Eyes: Pupils are equal, round, and reactive to light. EOM are normal.  Pulmonary/Chest: Effort normal.  Neurological: He is alert and oriented to person, place, and time.  Skin: Skin is warm and dry.  Psychiatric: He has a normal mood and affect. His behavior is normal.    Ortho  Exam awake alert and oriented x3.  Comfortable sitting.  Both knees with full extension and flexion over 105 degrees.  No instability.  Palpable osteophytes along the medial lateral compartments of both knee.  Moderate patellar crepitation.  No instability with varus valgus stress.  No calf pain.  No swelling distally.  Good pulses.  Feet are warm.  Straight leg raise negative bilaterally.  No pain with range of motion of either hip  Specialty Comments:  No specialty comments available.  Imaging: Xr Knee 3 View Left  Result Date: 01/14/2018 Numbness of the left knee were obtained in 3 projections standing.  There is considerable degenerative change in all 3 compartments.  Joint spaces are still maintained but considerably narrowed.  Subchondral sclerosis identified both medial lateral tibial plateaus.  Small osteophytes.  No ectopic calcification.  Patellofemoral joints are maintained but narrowed with osteophytes.  Findings are consistent with advanced osteoarthritis left knee  Xr Knee 3 View Right  Result Date: 01/14/2018 The right knee were obtained in 3 projections standing.  They are quite similar in comparison to the left knee where there is tricompartmental degenerative arthritis.  No ectopic calcification.  Increased valgus bilaterally of about 80 degrees.  Osteophyte formation and subchondral sclerosis identified in all 3 compartments    PMFS History: Patient Active Problem List   Diagnosis Date Noted  . COPD (chronic obstructive pulmonary disease) ([place]) 08/01/2017  . History of non-ST elevation myocardial infarction (NSTEMI)   . Accelerating angina ([place])   . History of renal stone   . BPH (benign prostatic hypertrophy)   . TIA (transient ischemic attack)   . Prostate nodule   . PVD (peripheral vascular disease) ([place])   . Vitamin D deficiency   . HTN (hypertension) 12/25/2012  . HLD (hyperlipidemia) 12/25/2012  . Screening for prostate cancer 12/25/2012  . Occlusion and  stenosis of carotid artery without mention of cerebral infarction 04/03/2012  . Coronary atherosclerosis of native coronary artery 04/15/2011  . Mixed hyperlipidemia 04/15/2011   Past Medical History:  Diagnosis Date  . Arthritis   . BPH (benign prostatic hypertrophy)   . Carotid artery disease (HCC)    Bilateral - Dr. Bridgett Larsson  . Colon polyp   . COPD (chronic obstructive pulmonary disease) ([place])   . Coronary atherosclerosis of native coronary artery    PTCA circumflex at [facility], nonobstructive residual disease at catheterization June and October 2016  . Essential hypertension, benign    Possible white coat  . GERD (gastroesophageal reflux disease)   . History of nephrolithiasis   . Mixed hyperlipidemia   . Prostate nodule   . PVD (peripheral vascular disease) ([place])   . TIA (transient ischemic attack)   . Vitamin D deficiency     Family History  Problem Relation Age of  Onset  . Gastric cancer Mother   . Varicose Veins Mother   . Cancer Mother        Stomach  . Prostate cancer Father   . Cancer Father        Prostate  . Other Brother        carotid artery stenosis  . Diabetes Brother   . Leukemia Brother   . Hyperlipidemia Brother   . Hypertension Brother   . Colon cancer Brother   . Hyperlipidemia Brother   . Peripheral vascular disease Brother   . Cancer Brother        Colon and Prostate  . Hypertension Sister   . Hyperlipidemia Sister   . Diabetes Daughter   . Diabetes Son   . Prostate cancer Brother     Past Surgical History:  Procedure Laterality Date  . ANGIOPLASTY Left 1995   Cir. Artery  . APPENDECTOMY  1947  . CARDIAC CATHETERIZATION N/A 02/03/2015   Procedure: Left Heart Cath and Cors/Grafts Angiography;  Surgeon: Sherren Mocha, MD;  Location: [facility];  Service: Cardiovascular;  Laterality: N/A;  . CARDIAC CATHETERIZATION N/A 05/19/2015   Procedure: Left Heart Cath and Coronary Angiography;  Surgeon: Troy Sine, MD;  Location: [facility];  Service: Cardiovascular;  Laterality: N/A;  . EYE SURGERY     Bilateral cataract  . TONSILLECTOMY  1968  . Traumatic injury  1962   Lost tip of index, middle, and ring finger/ right hand   Social History   Occupational History  . Not on file  Tobacco Use  . Smoking status: Former Smoker    Packs/day: 1.50    Years: 60.00    Pack years: 90.00    Types: Cigarettes    Last attempt to quit: 02/27/2011    Years since quitting: 6.8  . Smokeless tobacco: Never Used  Substance and Sexual Activity  . Alcohol use: No  . Drug use: No  . Sexual activity: Not on file

## 2018-01-19 ENCOUNTER — Other Ambulatory Visit: Payer: Self-pay | Admitting: *Deleted

## 2018-01-19 ENCOUNTER — Telehealth (INDEPENDENT_AMBULATORY_CARE_PROVIDER_SITE_OTHER): Payer: Self-pay

## 2018-01-19 ENCOUNTER — Other Ambulatory Visit: Payer: Medicare HMO

## 2018-01-19 DIAGNOSIS — I2510 Atherosclerotic heart disease of native coronary artery without angina pectoris: Secondary | ICD-10-CM | POA: Diagnosis not present

## 2018-01-19 NOTE — Telephone Encounter (Signed)
Submitted application online for Orthovisc injection series, right knee.

## 2018-01-19 NOTE — Telephone Encounter (Signed)
Do you mean just another set for Orthovisc right knee??

## 2018-01-19 NOTE — Telephone Encounter (Signed)
Yes.  Thank you.

## 2018-01-20 ENCOUNTER — Telehealth: Payer: Self-pay

## 2018-01-20 ENCOUNTER — Other Ambulatory Visit: Payer: Self-pay | Admitting: Family Medicine

## 2018-01-20 DIAGNOSIS — I2510 Atherosclerotic heart disease of native coronary artery without angina pectoris: Secondary | ICD-10-CM

## 2018-01-20 LAB — BASIC METABOLIC PANEL
BUN / CREAT RATIO: 14 (ref 10–24)
BUN: 20 mg/dL (ref 8–27)
CALCIUM: 9.4 mg/dL (ref 8.6–10.2)
CHLORIDE: 101 mmol/L (ref 96–106)
CO2: 28 mmol/L (ref 20–29)
CREATININE: 1.42 mg/dL — AB (ref 0.76–1.27)
GFR calc Af Amer: 55 mL/min/{1.73_m2} — ABNORMAL LOW (ref >59)
GFR calc non Af Amer: 47 mL/min/{1.73_m2} — ABNORMAL LOW (ref >59)
GLUCOSE: 85 mg/dL (ref 65–99)
Potassium: 4 mmol/L (ref 3.5–5.2)
Sodium: 143 mmol/L (ref 134–144)

## 2018-01-20 MED ORDER — HYDROCHLOROTHIAZIDE 12.5 MG PO CAPS: 12.5000 mg | ORAL_CAPSULE | Freq: Every day | ORAL | 0 refills | Status: DC

## 2018-01-20 MED ORDER — HYDROCHLOROTHIAZIDE 25 MG PO TABS: 12.5000 mg | ORAL_TABLET | Freq: Every day | ORAL | 4 refills | Status: DC

## 2018-01-20 NOTE — Telephone Encounter (Signed)
-----   Message from Satira Sark, MD sent at 01/20/2018  8:12 AM EDT ----- Results reviewed.  Creatinine 1.42 after starting Avapro recently.  Previously 1.17.  Would reduce HCTZ to 12.5 mg daily.  Follow-up BMET in 1 month. A copy of this test should be forwarded to Janora Norlander, DO.

## 2018-01-20 NOTE — Telephone Encounter (Signed)
Patient notified. Routed to PCP. Rx sent

## 2018-01-21 ENCOUNTER — Ambulatory Visit (INDEPENDENT_AMBULATORY_CARE_PROVIDER_SITE_OTHER): Payer: Medicare HMO | Admitting: Orthopaedic Surgery

## 2018-01-23 ENCOUNTER — Other Ambulatory Visit: Payer: Self-pay | Admitting: Family Medicine

## 2018-01-24 ENCOUNTER — Other Ambulatory Visit: Payer: Self-pay | Admitting: Family Medicine

## 2018-01-24 ENCOUNTER — Other Ambulatory Visit: Payer: Self-pay | Admitting: Cardiology

## 2018-01-26 ENCOUNTER — Telehealth (INDEPENDENT_AMBULATORY_CARE_PROVIDER_SITE_OTHER): Payer: Self-pay

## 2018-01-26 NOTE — Telephone Encounter (Signed)
Patient approved for Orthovisc series, right knee. Buy & Bill No PA required Patient will have a co-pay of $35.00 each visit. Patient will be responsible for 20% of Orthovisc(medication). Appts.scheduled with Dr. Durward Fortes.

## 2018-02-04 ENCOUNTER — Ambulatory Visit (INDEPENDENT_AMBULATORY_CARE_PROVIDER_SITE_OTHER): Payer: Medicare HMO | Admitting: Orthopaedic Surgery

## 2018-02-04 ENCOUNTER — Encounter (INDEPENDENT_AMBULATORY_CARE_PROVIDER_SITE_OTHER): Payer: Self-pay | Admitting: Orthopaedic Surgery

## 2018-02-04 VITALS — BP 123/75 | HR 62 | Ht 70.0 in | Wt 205.0 lb

## 2018-02-04 DIAGNOSIS — M1712 Unilateral primary osteoarthritis, left knee: Secondary | ICD-10-CM | POA: Diagnosis not present

## 2018-02-04 MED ORDER — HYALURONAN 30 MG/2ML IX SOSY
30.0000 mg | PREFILLED_SYRINGE | INTRA_ARTICULAR | Status: AC | PRN
  Administered 2018-02-04: 30 mg via INTRA_ARTICULAR

## 2018-02-04 NOTE — Progress Notes (Signed)
Office Visit Note   Patient: Hector Torres           Date of Birth: [birth year]           MRN: [id number] Visit Date: 02/04/2018              Requested by: Janora Norlander, DO [address] [place], [place] [address] PCP: Janora Norlander, DO   Assessment & Plan: Visit Diagnoses:  1. Unilateral primary osteoarthritis, left knee     Plan: Osteo-arthritis left knee.  Second Orthovisc injection today.  Office 1 week complete series  Follow-Up Instructions: Return in about 1 week (around 02/11/2018).   Orders:  Orders Placed This Encounter  Procedures  . Large Joint Inj: L knee   No orders of the defined types were placed in this encounter.     Procedures: Large Joint Inj: L knee on 02/04/2018 2:41 PM Indications: pain and joint swelling Details: 25 G 1.5 in needle, anteromedial approach  Arthrogram: No  Medications: 30 mg Hyaluronan 30 MG/2ML Outcome: tolerated well, no immediate complications Procedure, treatment alternatives, risks and benefits explained, specific risks discussed. Consent was given by the patient. Immediately prior to procedure a time out was called to verify the correct patient, procedure, equipment, support staff and site/side marked as required. Patient was prepped and draped in the usual sterile fashion.       Clinical Data: No additional findings.   Subjective: Chief Complaint  Patient presents with  . Left Knee - Pain  . Follow-up    L KNEE ORTHOVISC # 2    HPI  Review of Systems  Constitutional: Positive for fatigue.  HENT: Negative for ear pain.   Eyes: Negative for pain.  Respiratory: Negative for cough.   Cardiovascular: Positive for leg swelling.  Gastrointestinal: Negative for constipation and diarrhea.  Genitourinary: Negative for difficulty urinating.  Musculoskeletal: Negative for back pain.  Skin: Negative for rash.  Allergic/Immunologic: Negative for food allergies.  Neurological: Positive for weakness.    Hematological: Bruises/bleeds easily.  Psychiatric/Behavioral: Negative for sleep disturbance.     Objective: Vital Signs: BP 123/75 (BP Location: Left Arm, Patient Position: Sitting, Cuff Size: Normal)   Pulse 62   Ht 5\' 10"  (1.778 m)   Wt 205 lb (93 kg)   BMI 29.41 kg/m   Physical Exam  Ortho Exam alert and oriented x3.  Comfortable sitting.  Red or swollen.  No effusion.  We will proceed with the second Orthovisc injection  Specialty Comments:  No specialty comments available.  Imaging: No results found.   PMFS History: Patient Active Problem List   Diagnosis Date Noted  . Unilateral primary osteoarthritis, left knee 02/04/2018  . COPD (chronic obstructive pulmonary disease) ([place]) 08/01/2017  . History of non-ST elevation myocardial infarction (NSTEMI)   . Accelerating angina ([place])   . History of renal stone   . BPH (benign prostatic hypertrophy)   . TIA (transient ischemic attack)   . Prostate nodule   . PVD (peripheral vascular disease) ([place])   . Vitamin D deficiency   . HTN (hypertension) 12/25/2012  . HLD (hyperlipidemia) 12/25/2012  . Screening for prostate cancer 12/25/2012  . Occlusion and stenosis of carotid artery without mention of cerebral infarction 04/03/2012  . Coronary atherosclerosis of native coronary artery 04/15/2011  . Mixed hyperlipidemia 04/15/2011   Past Medical History:  Diagnosis Date  . Arthritis   . BPH (benign prostatic hypertrophy)   . Carotid artery disease (HCC)    Bilateral -  Dr. Bridgett Larsson  . Colon polyp   . COPD (chronic obstructive pulmonary disease) ([place])   . Coronary atherosclerosis of native coronary artery    PTCA circumflex at [facility], nonobstructive residual disease at catheterization June and October 2016  . Essential hypertension, benign    Possible white coat  . GERD (gastroesophageal reflux disease)   . History of nephrolithiasis   . Mixed hyperlipidemia   . Prostate nodule   . PVD (peripheral vascular disease)  ([place])   . TIA (transient ischemic attack)   . Vitamin D deficiency     Family History  Problem Relation Age of Onset  . Gastric cancer Mother   . Varicose Veins Mother   . Cancer Mother        Stomach  . Prostate cancer Father   . Cancer Father        Prostate  . Other Brother        carotid artery stenosis  . Diabetes Brother   . Leukemia Brother   . Hyperlipidemia Brother   . Hypertension Brother   . Colon cancer Brother   . Hyperlipidemia Brother   . Peripheral vascular disease Brother   . Cancer Brother        Colon and Prostate  . Hypertension Sister   . Hyperlipidemia Sister   . Diabetes Daughter   . Diabetes Son   . Prostate cancer Brother     Past Surgical History:  Procedure Laterality Date  . ANGIOPLASTY Left 1995   Cir. Artery  . APPENDECTOMY  1947  . CARDIAC CATHETERIZATION N/A 02/03/2015   Procedure: Left Heart Cath and Cors/Grafts Angiography;  Surgeon: Sherren Mocha, MD;  Location: [facility] [facility];  Service: Cardiovascular;  Laterality: N/A;  . CARDIAC CATHETERIZATION N/A 05/19/2015   Procedure: Left Heart Cath and Coronary Angiography;  Surgeon: Troy Sine, MD;  Location: [facility];  Service: Cardiovascular;  Laterality: N/A;  . EYE SURGERY     Bilateral cataract  . TONSILLECTOMY  1968  . Traumatic injury  1962   Lost tip of index, middle, and ring finger/ right hand   Social History   Occupational History  . Not on file  Tobacco Use  . Smoking status: Former Smoker    Packs/day: 1.50    Years: 60.00    Pack years: 90.00    Types: Cigarettes    Last attempt to quit: 02/27/2011    Years since quitting: 6.9  . Smokeless tobacco: Never Used  Substance and Sexual Activity  . Alcohol use: No  . Drug use: No  . Sexual activity: Not on file

## 2018-02-11 ENCOUNTER — Ambulatory Visit (INDEPENDENT_AMBULATORY_CARE_PROVIDER_SITE_OTHER): Payer: Medicare HMO | Admitting: *Deleted

## 2018-02-11 ENCOUNTER — Encounter: Payer: Self-pay | Admitting: *Deleted

## 2018-02-11 VITALS — BP 171/83 | HR 64 | Ht 70.0 in | Wt 211.0 lb

## 2018-02-11 DIAGNOSIS — Z0000 Encounter for general adult medical examination without abnormal findings: Secondary | ICD-10-CM

## 2018-02-11 DIAGNOSIS — Z1211 Encounter for screening for malignant neoplasm of colon: Secondary | ICD-10-CM

## 2018-02-11 DIAGNOSIS — Z23 Encounter for immunization: Secondary | ICD-10-CM

## 2018-02-11 NOTE — Patient Instructions (Addendum)
Please work on your goal of losing weight by increasing your exercise to 3 time per week for 30 minutes.   You received your tdap vaccine today.    At your convenience, please bring a copy of your Advanced Directives to our office to be filed in your medical record.  Thank you for coming in your Annual Wellness Visit today!  Preventive Care 78 Years and Older, Male Preventive care refers to lifestyle choices and visits with your health care provider that can promote health and wellness. What does preventive care include?  A yearly physical exam. This is also called an annual well check.  Dental exams once or twice a year.  Routine eye exams. Ask your health care provider how often you should have your eyes checked.  Personal lifestyle choices, including: ? Daily care of your teeth and gums. ? Regular physical activity. ? Eating a healthy diet. ? Avoiding tobacco and drug use. ? Limiting alcohol use. ? Practicing safe sex. ? Taking low doses of aspirin every day. ? Taking vitamin and mineral supplements as recommended by your health care provider. What happens during an annual well check? The services and screenings done by your health care provider during your annual well check will depend on your age, overall health, lifestyle risk factors, and family history of disease. Counseling Your health care provider may ask you questions about your:  Alcohol use.  Tobacco use.  Drug use.  Emotional well-being.  Home and relationship well-being.  Sexual activity.  Eating habits.  History of falls.  Memory and ability to understand (cognition).  Work and work Statistician.  Screening You may have the following tests or measurements:  Height, weight, and BMI.  Blood pressure.  Lipid and cholesterol levels. These may be checked every 5 years, or more frequently if you are over 78 years old.  Skin check.  Lung cancer screening. You may have this screening every year  starting at age 78 if you have a 30-pack-year history of smoking and currently smoke or have quit within the past 15 years.  Fecal occult blood test (FOBT) of the stool. You may have this test every year starting at age 78.  Flexible sigmoidoscopy or colonoscopy. You may have a sigmoidoscopy every 5 years or a colonoscopy every 10 years starting at age 78.  Prostate cancer screening. Recommendations will vary depending on your family history and other risks.  Hepatitis C blood test.  Hepatitis B blood test.  Sexually transmitted disease (STD) testing.  Diabetes screening. This is done by checking your blood sugar (glucose) after you have not eaten for a while (fasting). You may have this done every 1-3 years.  Abdominal aortic aneurysm (AAA) screening. You may need this if you are a current or former smoker.  Osteoporosis. You may be screened starting at age 18 if you are at high risk.  Talk with your health care provider about your test results, treatment options, and if necessary, the need for more tests. Vaccines Your health care provider may recommend certain vaccines, such as:  Influenza vaccine. This is recommended every year.  Tetanus, diphtheria, and acellular pertussis (Tdap, Td) vaccine. You may need a Td booster every 10 years.  Varicella vaccine. You may need this if you have not been vaccinated.  Zoster vaccine. You may need this after age 78.  Measles, mumps, and rubella (MMR) vaccine. You may need at least one dose of MMR if you were born in 1957 or later. You may also  need a second dose.  Pneumococcal 13-valent conjugate (PCV13) vaccine. One dose is recommended after age 78.  Pneumococcal polysaccharide (PPSV23) vaccine. One dose is recommended after age 78.  Meningococcal vaccine. You may need this if you have certain conditions.  Hepatitis A vaccine. You may need this if you have certain conditions or if you travel or work in places where you may be exposed  to hepatitis A.  Hepatitis B vaccine. You may need this if you have certain conditions or if you travel or work in places where you may be exposed to hepatitis B.  Haemophilus influenzae type b (Hib) vaccine. You may need this if you have certain risk factors.  Talk to your health care provider about which screenings and vaccines you need and how often you need them. This information is not intended to replace advice given to you by your health care provider. Make sure you discuss any questions you have with your health care provider. Document Released: 09/01/2015 Document Revised: 04/24/2016 Document Reviewed: 06/06/2015 Elsevier Interactive Patient Education  [employer].

## 2018-02-11 NOTE — Progress Notes (Signed)
Subjective:   Hector Torres is a 77 y.o. male who presents for an Initial Medicare Annual Wellness Visit.  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  He enjoys going to church, playing golf with 2 different groups of friends,  Playing cards at the senior center weekly, and gardening.  He is widowed and lives alone.  He has 2 children, and 6 grandchildren, that live in [place], and he sees them often.  Hector Torres reports not hospitalizations, ER visits, or surgeries in the past year.  He feels his health this year is about the same as it was last year.   Review of Systems   Skin -  small skin tear left anteriomedial forearm.       Objective:    Today's Vitals   02/11/18 1014 02/11/18 1030  BP: (!) 186/88 (!) 171/83  Pulse: 64   Weight: 211 lb (95.7 kg)   Height: 5\' 10"  (1.778 m)   PainSc: 0-No pain    Body mass index is 30.28 kg/m.  Advanced Directives 02/11/2018 05/18/2015 04/14/2015 02/03/2015 10/14/2014 04/08/2014  Does Patient Have a Medical Advance Directive? Yes No Yes Yes Yes Yes  Type of Paramedic of [place];Living will - Healthcare Power of [place];Living will Living will;Healthcare Power of Attorney -  Does patient want to make changes to medical advance directive? - - No - Patient declined No - Patient declined No - Patient declined -  Copy of [place] in Chart? No - copy requested - No - copy requested No - copy requested No - copy requested No - copy requested  Would patient like information on creating a medical advance directive? - No - patient declined information - - - -    Current Medications (verified) Outpatient Encounter Medications as of 02/11/2018  Medication Sig  . aspirin 81 MG tablet Take 81 mg by mouth daily.  . carvedilol (COREG) 6.25 MG tablet Take 1 tablet (6.25 mg total)  by mouth 2 (two) times daily with a meal.  . Cholecalciferol (VITAMIN D) 1000 UNITS capsule Take 1,000 Units by mouth daily.   . diphenhydramine-acetaminophen (TYLENOL PM) 25-500 MG TABS tablet Take 2 tablets by mouth at bedtime as needed (sleep/pain).  . hydrochlorothiazide (MICROZIDE) 12.5 MG capsule Take 1 capsule (12.5 mg total) by mouth daily.  . irbesartan (AVAPRO) 75 MG tablet Take 1 tablet (75 mg total) by mouth daily.  . isosorbide mononitrate (IMDUR) 30 MG 24 hr tablet Take 1 tablet (30 mg total) by mouth daily.  . Multiple Vitamin (MULTIVITAMIN PO) Take 1 tablet by mouth daily.    . rosuvastatin (CRESTOR) 20 MG tablet Take 1 tablet (20 mg total) by mouth daily.  Marland Kitchen umeclidinium-vilanterol (ANORO ELLIPTA) 62.5-25 MCG/INH AEPB INHALE 1 PUFF INTO LUNGS DAILY  . vitamin E 400 UNIT capsule Take 400 Units by mouth daily.     Facility-Administered Encounter Medications as of 02/11/2018  Medication  . methylPREDNISolone acetate (DEPO-MEDROL) injection 40 mg    Allergies (verified) Norvasc [amlodipine besylate]   History: Past Medical History:  Diagnosis Date  . Arthritis   . BPH (benign prostatic hypertrophy)   . Carotid artery disease (HCC)    Bilateral - Dr. Bridgett Larsson  . Colon polyp   . COPD (chronic obstructive pulmonary disease) ([place])   . Coronary atherosclerosis of native coronary artery  [facility]flex at [facility], nonobstructive residual disease at catheterization June and October 2016  . Essential hypertension, benign    Possible white coat  . GERD (gastroesophageal reflux disease)   . History of nephrolithiasis   . Mixed hyperlipidemia   . Prostate nodule   . PVD (peripheral vascular disease) ([place])   . TIA (transient ischemic attack)   . Vitamin D deficiency    Past Surgical History:  Procedure Laterality Date  . ANGIOPLASTY Left 1995   Cir. Artery  . APPENDECTOMY  1947  . CARDIAC CATHETERIZATION N/A 02/03/2015   Procedure: Left Heart Cath and Cors/Grafts  Angiography;  Surgeon: Sherren Mocha, MD;  Location: [facility];  Service: Cardiovascular;  Laterality: N/A;  . CARDIAC CATHETERIZATION N/A 05/19/2015   Procedure: Left Heart Cath and Coronary Angiography;  Surgeon: Troy Sine, MD;  Location: [facility];  Service: Cardiovascular;  Laterality: N/A;  . EYE SURGERY     Bilateral cataract  . TONSILLECTOMY  1968  . Traumatic injury  1962   Lost tip of index, middle, and ring finger/ right hand   Family History  Problem Relation Age of Onset  . Gastric cancer Mother   . Varicose Veins Mother   . Cancer Mother        Stomach  . Prostate cancer Father   . Cancer Father        Prostate  . Other Brother        carotid artery stenosis  . Diabetes Brother   . Leukemia Brother   . Hyperlipidemia Brother   . Hypertension Brother   . Colon cancer Brother   . Hyperlipidemia Brother   . Peripheral vascular disease Brother   . Cancer Brother        Colon and Prostate  . Hypertension Sister   . Hyperlipidemia Sister   . Diabetes Daughter   . Diabetes Son   . Prostate cancer Brother    Social History   Socioeconomic History  . Marital status: Widowed    Spouse name: Not on file  . Number of children: Not on file  . Years of education: Not on file  . Highest education level: Not on file  Occupational History  . Not on file  Social Needs  . Financial resource strain: Not on file  . Food insecurity:    Worry: Not on file    Inability: Not on file  . Transportation needs:    Medical: Not on file    Non-medical: Not on file  Tobacco Use  . Smoking status: Former Smoker    Packs/day: 1.50    Years: 60.00    Pack years: 90.00    Types: Cigarettes    Last attempt to quit: 02/27/2011    Years since quitting: 6.9  . Smokeless tobacco: Never Used  Substance and Sexual Activity  . Alcohol use: No  . Drug use: No  . Sexual activity: Not on file  Lifestyle  . Physical activity:    Days per week: Not on file     Minutes per session: Not on file  . Stress: Not on file  Relationships  . Social connections:    Talks on phone: Not on file    Gets together: Not on file    Attends religious service: Not on file    Active member of club or organization: Not on file    Attends meetings of clubs or organizations: Not on file    Relationship status:  Not on file  Other Topics Concern  . Not on file  Social History Narrative  . Not on file   Tobacco Counseling Counseling given: Yes   Clinical Intake:     Pain Score: 0-No pain                 Activities of Daily Living In your present state of health, do you have any difficulty performing the following activities: 02/11/2018  Hearing? N  Vision? N  Difficulty concentrating or making decisions? N  Walking or climbing stairs? Y  Comment causes bilateral knee pain  Dressing or bathing? N  Doing errands, shopping? N  Preparing Food and eating ? N  Using the Toilet? N  In the past six months, have you accidently leaked urine? N  Do you have problems with loss of bowel control? N  Managing your Medications? N  Managing your Finances? N  Housekeeping or managing your Housekeeping? N  Some recent data might be hidden     Immunizations and Health Maintenance Immunization History  Administered Date(s) Administered  . Influenza Split 06/11/2013  . Influenza, High Dose Seasonal PF 06/14/2014, 06/03/2017  . Influenza,inj,Quad PF,6+ Mos 05/19/2015  . Influenza-Unspecified 05/31/2016  . Pneumococcal Polysaccharide-23 06/27/2008, 05/19/2015  . Tdap 04/25/2006, 02/11/2018  . Zoster 08/10/2008   Health Maintenance Due  Topic Date Due  . PNA vac Low Risk Adult (2 of 2 - PCV13) 05/18/2016  . COLONOSCOPY  09/03/2016    Patient Care Team: Janora Norlander, DO as PCP - General (Family Medicine) Cathe Mons, MD (Gastroenterology) Satira Sark, MD as Consulting Physician (Cardiology) Garald Balding, MD as Consulting  Physician (Orthopedic Surgery) Festus Aloe, MD as Consulting Physician (Urology) Temas, Verdie Shire, MD as Referring Physician (Ophthalmology)      Assessment:   This is a routine wellness examination for Tamar.  Hearing/Vision screen No hearing deficit noted. Patient is seen yearly by Dr. Keith Rake in [facility] for eye exams.  Patient states he sees well with his glasses.  Dietary issues and exercise activities discussed:    Patient states he usually has 1 larger meal and 1-2 smaller meals per day and snacks as needed.  He tries to eat mostly lean proteins and vegetables.  He does enjoy bread and usually has it once per day.  He also eats at least 1-2 servings of fruit per day.    He currently plays golf 2-3 days per week and this is his main form of exercise.  He is interested in loosing around 10 lbs and plans to do this by increasing his walking for exercise.  Goals    . Weight (lb) < 200 lb (90.7 kg)     Increase walking 3 times per week for 30 minutes.           Depression Screen PHQ 2/9 Scores 02/11/2018 08/28/2017 08/01/2017 10/09/2016  PHQ - 2 Score 0 0 0 0    Fall Risk Fall Risk  02/11/2018 08/28/2017 08/01/2017 10/09/2016 04/10/2016  Falls in the past year? No No No No No    Is the patient's home free of loose throw rugs in walkways, pet beds, electrical cords, etc?   yes      Grab bars in the bathroom? no      Handrails on the stairs?   no stairs in home      Adequate lighting?   yes  Cognitive Function: MMSE - Mini Mental State Exam 02/11/2018  Orientation to time  5  Orientation to Place 5  Registration 3  Attention/ Calculation 5  Recall 3  Language- name 2 objects 2  Language- repeat 1  Language- follow 3 step command 3  Language- read & follow direction 1  Write a sentence 1  Copy design 1  Total score 30        Screening Tests Health Maintenance  Topic Date Due  . PNA vac Low Risk Adult (2 of 2 - PCV13) 05/18/2016  . COLONOSCOPY   09/03/2016  . INFLUENZA VACCINE  03/19/2018  . TETANUS/TDAP  02/12/2028   Patient plans to get prevnar at next visit with Dr. Lajuana Ripple.   Qualifies for Shingles Vaccine? Declined today  Cancer Screenings: Lung: Low Dose CT Chest recommended if Age 66-80 years, 30 pack-year currently smoking OR have quit w/in 15years. Patient does qualify. Colorectal: referral entered for screening colonoscopy.   Additional Screenings:  Hepatitis C Screening:  Recommend at next visit with Dr. Lajuana Ripple      Plan:      Work on your goal of losing weight by increasing your exercise to 3 time per week for 30 minutes.  Bring a copy of your Advanced Directives to our office to be filed in your medical record. Follow up with Dr. Lajuana Ripple as recommended by her in 5 months or before if needed.  I have personally reviewed and noted the following in the patient's chart:   . Medical and social history . Use of alcohol, tobacco or illicit drugs  . Current medications and supplements . Functional ability and status . Nutritional status . Physical activity . Advanced directives . List of other physicians . Hospitalizations, surgeries, and ER visits in previous 12 months . Vitals . Screenings to include cognitive, depression, and falls . Referrals and appointments  In addition, I have reviewed and discussed with patient certain preventive protocols, quality metrics, and best practice recommendations. A written personalized care plan for preventive services as well as general preventive health recommendations were provided to patient.     Denyce Robert, RN   02/11/2018

## 2018-02-18 ENCOUNTER — Ambulatory Visit (INDEPENDENT_AMBULATORY_CARE_PROVIDER_SITE_OTHER): Payer: Medicare HMO | Admitting: Orthopaedic Surgery

## 2018-02-18 ENCOUNTER — Encounter (INDEPENDENT_AMBULATORY_CARE_PROVIDER_SITE_OTHER): Payer: Self-pay | Admitting: Orthopaedic Surgery

## 2018-02-18 VITALS — BP 103/51 | HR 63 | Ht 70.0 in | Wt 210.0 lb

## 2018-02-18 DIAGNOSIS — M1712 Unilateral primary osteoarthritis, left knee: Secondary | ICD-10-CM | POA: Diagnosis not present

## 2018-02-18 MED ORDER — HYALURONAN 30 MG/2ML IX SOSY
30.0000 mg | PREFILLED_SYRINGE | INTRA_ARTICULAR | Status: AC | PRN
  Administered 2018-02-18: 30 mg via INTRA_ARTICULAR

## 2018-02-18 NOTE — Progress Notes (Signed)
Office Visit Note   Patient: Hector Torres           Date of Birth: 1941/04/12           MRN: [id number] Visit Date: 02/18/2018              Requested by: Janora Norlander, DO [address] [place], [place] [address] PCP: Janora Norlander, DO   Assessment & Plan: Visit Diagnoses:  1. Unilateral primary osteoarthritis, left knee     Plan: Third Orthovisc injection left knee.  Follow-up as needed  Follow-Up Instructions: Return if symptoms worsen or fail to improve.   Orders:  Orders Placed This Encounter  Procedures  . Large Joint Inj: L knee   No orders of the defined types were placed in this encounter.     Procedures: Large Joint Inj: L knee on 02/18/2018 2:59 PM Indications: pain and joint swelling Details: 25 G 1.5 in needle, anteromedial approach  Arthrogram: No  Medications: 30 mg Hyaluronan 30 MG/2ML Outcome: tolerated well, no immediate complications Procedure, treatment alternatives, risks and benefits explained, specific risks discussed. Consent was given by the patient. Immediately prior to procedure a time out was called to verify the correct patient, procedure, equipment, support staff and site/side marked as required. Patient was prepped and draped in the usual sterile fashion.       Clinical Data: No additional findings.   Subjective: Chief Complaint  Patient presents with  . Follow-up    BI LAT KNEE PAIN FOR SEV YRS. HAD 2 INJECTIONS IN LEFT KNEE  Has had 2 prior Orthovisc injections left knee.  Not sure he can tell much of a difference "yet.  No complications  HPI  Review of Systems  Constitutional: Negative for fatigue and fever.  HENT: Negative for ear pain.   Eyes: Negative for pain.  Respiratory: Positive for cough and shortness of breath.   Cardiovascular: Negative for leg swelling.  Gastrointestinal: Negative for constipation and diarrhea.  Genitourinary: Negative for difficulty urinating.  Musculoskeletal: Negative for back  pain and neck pain.  Skin: Negative for rash.  Allergic/Immunologic: Negative for food allergies.  Neurological: Positive for weakness. Negative for numbness.  Hematological: Bruises/bleeds easily.  Psychiatric/Behavioral: Negative for sleep disturbance.     Objective: Vital Signs: BP (!) 103/51 (BP Location: Right Arm, Patient Position: Sitting, Cuff Size: Normal)   Pulse 63   Ht 5\' 10"  (1.778 m)   Wt 210 lb (95.3 kg)   BMI 30.13 kg/m   Physical Exam  Ortho Exam awake alert and oriented x3.  Comfortable sitting left knee was not hot warm or red.  No effusion.  Specialty Comments:  No specialty comments available.  Imaging: No results found.   PMFS History: Patient Active Problem List   Diagnosis Date Noted  . Unilateral primary osteoarthritis, left knee 02/04/2018  . COPD (chronic obstructive pulmonary disease) ([place]) 08/01/2017  . History of non-ST elevation myocardial infarction (NSTEMI)   . Accelerating angina ([place])   . History of renal stone   . BPH (benign prostatic hypertrophy)   . TIA (transient ischemic attack)   . Prostate nodule   . PVD (peripheral vascular disease) ([place])   . Vitamin D deficiency   . HTN (hypertension) 12/25/2012  . HLD (hyperlipidemia) 12/25/2012  . Screening for prostate cancer 12/25/2012  . Occlusion and stenosis of carotid artery without mention of cerebral infarction 04/03/2012  . Coronary atherosclerosis of native coronary artery 04/15/2011  . Mixed hyperlipidemia 04/15/2011  Past Medical History:  Diagnosis Date  . Arthritis   . BPH (benign prostatic hypertrophy)   . Carotid artery disease ([facility])    Bilateral - Dr. Bridgett Larsson  . Colon polyp   . COPD (chronic obstructive pulmonary disease) ([place])   . Coronary atherosclerosis of native coronary artery    PTCA circumflex at [facility], nonobstructive residual disease at catheterization June and October 2016  . Essential hypertension, benign    Possible white coat  . GERD  (gastroesophageal reflux disease)   . History of nephrolithiasis   . Mixed hyperlipidemia   . Prostate nodule   . PVD (peripheral vascular disease) ([place])   . TIA (transient ischemic attack)   . Vitamin D deficiency     Family History  Problem Relation Age of Onset  . Gastric cancer Mother   . Varicose Veins Mother   . Cancer Mother        Stomach  . Prostate cancer Father   . Cancer Father        Prostate  . Other Brother        carotid artery stenosis  . Diabetes Brother   . Leukemia Brother   . Hyperlipidemia Brother   . Hypertension Brother   . Colon cancer Brother   . Hyperlipidemia Brother   . Peripheral vascular disease Brother   . Cancer Brother        Colon and Prostate  . Hypertension Sister   . Hyperlipidemia Sister   . Diabetes Daughter   . Diabetes Son   . Prostate cancer Brother     Past Surgical History:  Procedure Laterality Date  . ANGIOPLASTY Left 1995   Cir. Artery  . APPENDECTOMY  1947  . CARDIAC CATHETERIZATION N/A 02/03/2015   Procedure: Left Heart Cath and Cors/Grafts Angiography;  Surgeon: Sherren Mocha, MD;  Location: [facility];  Service: Cardiovascular;  Laterality: N/A;  . CARDIAC CATHETERIZATION N/A 05/19/2015   Procedure: Left Heart Cath and Coronary Angiography;  Surgeon: Troy Sine, MD;  Location: [facility];  Service: Cardiovascular;  Laterality: N/A;  . EYE SURGERY     Bilateral cataract  . TONSILLECTOMY  1968  . Traumatic injury  1962   Lost tip of index, middle, and ring finger/ right hand   Social History   Occupational History  . Not on file  Tobacco Use  . Smoking status: Former Smoker    Packs/day: 1.50    Years: 60.00    Pack years: 90.00    Types: Cigarettes    Last attempt to quit: 02/27/2011    Years since quitting: 6.9  . Smokeless tobacco: Never Used  Substance and Sexual Activity  . Alcohol use: No  . Drug use: No  . Sexual activity: Not on file

## 2018-02-21 ENCOUNTER — Other Ambulatory Visit: Payer: Self-pay | Admitting: Family Medicine

## 2018-02-21 DIAGNOSIS — E785 Hyperlipidemia, unspecified: Secondary | ICD-10-CM

## 2018-02-25 ENCOUNTER — Other Ambulatory Visit: Payer: Self-pay | Admitting: *Deleted

## 2018-02-25 ENCOUNTER — Other Ambulatory Visit: Payer: Medicare HMO

## 2018-02-25 DIAGNOSIS — I10 Essential (primary) hypertension: Secondary | ICD-10-CM | POA: Diagnosis not present

## 2018-02-25 DIAGNOSIS — I2510 Atherosclerotic heart disease of native coronary artery without angina pectoris: Secondary | ICD-10-CM | POA: Diagnosis not present

## 2018-02-25 LAB — BASIC METABOLIC PANEL
BUN / CREAT RATIO: 15 (ref 10–24)
BUN: 19 mg/dL (ref 8–27)
CALCIUM: 9.1 mg/dL (ref 8.6–10.2)
CHLORIDE: 102 mmol/L (ref 96–106)
CO2: 25 mmol/L (ref 20–29)
Creatinine, Ser: 1.25 mg/dL (ref 0.76–1.27)
GFR calc non Af Amer: 55 mL/min/{1.73_m2} — ABNORMAL LOW (ref >59)
GFR, EST AFRICAN AMERICAN: 63 mL/min/{1.73_m2} (ref >59)
GLUCOSE: 96 mg/dL (ref 65–99)
Potassium: 3.9 mmol/L (ref 3.5–5.2)
Sodium: 143 mmol/L (ref 134–144)

## 2018-02-26 ENCOUNTER — Telehealth: Payer: Self-pay

## 2018-02-26 NOTE — Telephone Encounter (Signed)
-----   Message from Satira Sark, MD sent at 02/26/2018  8:48 AM EDT ----- Results reviewed.  Renal function improved compared to lab work from 1 month ago, creatinine now 1.25 and normal potassium.  Continue with current plan. A copy of this test should be forwarded to Janora Norlander, DO.

## 2018-02-26 NOTE — Telephone Encounter (Signed)
Patient notified. Routed to PCP 

## 2018-03-09 ENCOUNTER — Ambulatory Visit (INDEPENDENT_AMBULATORY_CARE_PROVIDER_SITE_OTHER): Payer: Self-pay

## 2018-03-09 DIAGNOSIS — Z86010 Personal history of colonic polyps: Secondary | ICD-10-CM

## 2018-03-09 MED ORDER — NA SULFATE-K SULFATE-MG SULF 17.5-3.13-1.6 GM/177ML PO SOLN: 1.0000 | ORAL | 0 refills | Status: DC

## 2018-03-09 NOTE — Progress Notes (Signed)
Gastroenterology Pre-Procedure Review  Request Date:03/09/18 Requesting Physician: Dr.Gottschalk DO ( per pt- he had  one 2015 by Dr.Benson, pt had 11 polyps, he said he had another one in 2016 with only 1 polyp.) (2016 report is in [place])  PATIENT REVIEW QUESTIONS: The patient responded to the following health history questions as indicated:    1. Diabetes Melitis: no 2. Joint replacements in the past 12 months: no 3. Major health problems in the past 3 months: no 4. Has an artificial valve or MVP: no 5. Has a defibrillator: no 6. Has been advised in past to take antibiotics in advance of a procedure like teeth cleaning: no 7. Family history of colon cancer: yes (father age 55 and brother age 84)  12. Alcohol Use: no 9. History of sleep apnea: no  10. History of coronary artery or other vascular stents placed within the last 12 months: no 11. History of any prior anesthesia complications: no    MEDICATIONS & ALLERGIES:    Patient reports the following regarding taking any blood thinners:   Plavix? no Aspirin? yes (81mg ) Coumadin? no Brilinta? no Xarelto? no Eliquis? no Pradaxa? no Savaysa? no Effient? no  Patient confirms/reports the following medications:  Current Outpatient Medications  Medication Sig Dispense Refill  . aspirin 81 MG tablet Take 81 mg by mouth daily.    . carvedilol (COREG) 6.25 MG tablet Take 1 tablet (6.25 mg total) by mouth 2 (two) times daily with a meal. 180 tablet 4  . diphenhydramine-acetaminophen (TYLENOL PM) 25-500 MG TABS tablet Take 2 tablets by mouth at bedtime as needed (sleep/pain).    . hydrochlorothiazide (MICROZIDE) 12.5 MG capsule Take 1 capsule (12.5 mg total) by mouth daily. 90 capsule 0  . irbesartan (AVAPRO) 75 MG tablet Take 1 tablet (75 mg total) by mouth daily. 90 tablet 1  . isosorbide mononitrate (IMDUR) 30 MG 24 hr tablet Take 1 tablet (30 mg total) by mouth daily. 90 tablet 1  . Multiple Vitamin (MULTIVITAMIN PO) Take 1 tablet by  mouth daily.      . rosuvastatin (CRESTOR) 20 MG tablet Take 1 tablet (20 mg total) by mouth daily. 90 tablet 4  . umeclidinium-vilanterol (ANORO ELLIPTA) 62.5-25 MCG/INH AEPB INHALE 1 PUFF INTO LUNGS DAILY 180 each 4   Current Facility-Administered Medications  Medication Dose Route Frequency Provider Last Rate Last Dose  . methylPREDNISolone acetate (DEPO-MEDROL) injection 40 mg  40 mg Intra-articular Once Ronnie Doss M, DO        Patient confirms/reports the following allergies:  Allergies  Allergen Reactions  . Norvasc [Amlodipine Besylate] Swelling    LE swelling    No orders of the defined types were placed in this encounter.   AUTHORIZATION INFORMATION Primary Insurance: Josephine Igo,  [place] #: [id number] Pre-Cert / Josem Kaufmann required:    SCHEDULE INFORMATION: Procedure has been scheduled as follows:  Date: 03/19/18, Time: 12:00 Location: [facility]  This Gastroenterology Pre-Precedure Review Form is being routed to the following provider(s): Neil Crouch, PA

## 2018-03-09 NOTE — Patient Instructions (Addendum)
Hector Torres  1940/11/20 MRN: [id number]     Procedure Date: 03/19/18 Time to register: 11:00am Place to register: [place] Stay Procedure Time: 12:00 Scheduled provider: Barney Drain, MD    PREPARATION FOR COLONOSCOPY WITH SUPREP BOWEL PREP KIT  Note: Suprep Bowel Prep Kit is a split-dose (2day) regimen. Consumption of BOTH 6-ounce bottles is required for a complete prep.  Please notify us immediately if you are diabetic, take iron supplements, or if you are on Coumadin or any other blood thinners.                                                                                                                                                    1 DAY BEFORE PROCEDURE:  DATE: 03/18/18   DAY: Wednesday  clear liquids the entire day - NO SOLID FOOD.     At 6:00pm: Complete steps 1 through 4 below, using ONE (1) 6-ounce bottle, before going to bed. Step 1:  Pour ONE (1) 6-ounce bottle of SUPREP liquid into the mixing container.  Step 2:  Add cool drinking water to the 16 ounce line on the container and mix.  Note: Dilute the solution concentrate as directed prior to use. Step 3:  DRINK ALL the liquid in the container. Step 4:  You MUST drink an additional two (2) or more 16 ounce containers of water over the next one (1) hour.   Continue clear liquids.  DAY OF PROCEDURE:   DATE: 03/19/18   DAY: Thursday If you take medications for your heart, blood pressure, or breathing, you may take these medications.    5 hours before your procedure at 7:00am: Step 1:  Pour ONE (1) 6-ounce bottle of SUPREP liquid into the mixing container.  Step 2:  Add cool drinking water to the 16 ounce line on the container and mix.  Note: Dilute the solution concentrate as directed prior to use. Step 3:  DRINK ALL the liquid in the container. Step 4:  You MUST drink an additional two (2) or more 16 ounce containers of water over the next one (1) hour. You MUST complete the final glass of water at  least 3 hours before your colonoscopy.    Nothing by mouth past 9:00am  You may take your morning medications with sip of water unless we have instructed otherwise.    Please see below for Dietary Information.  CLEAR LIQUIDS INCLUDE:  Water Jello (NOT red in color)   Ice Popsicles (NOT red in color)   Tea (sugar ok, no milk/cream) Powdered fruit flavored drinks  Coffee (sugar ok, no milk/cream) Gatorade/ Lemonade/ Kool-Aid  (NOT red in color)   Juice: apple, white grape, white cranberry Soft drinks  Clear bullion, consomme, broth (fat free beef/chicken/vegetable)  Carbonated beverages (any kind)  Strained chicken noodle soup Hard Candy   Remember:  Clear liquids are liquids that will allow you to see your fingers on the other side of a clear glass. Be sure liquids are NOT red in color, and not cloudy, but CLEAR.  DO NOT EAT OR DRINK ANY OF THE FOLLOWING:  Dairy products of any kind   Cranberry juice Tomato juice / V8 juice   Grapefruit juice Orange juice     Red grape juice  Do not eat any solid foods, including such foods as: cereal, oatmeal, yogurt, fruits, vegetables, creamed soups, eggs, bread, crackers, pureed foods in a blender, etc.   HELPFUL HINTS FOR DRINKING PREP SOLUTION:   Make sure prep is extremely cold. Mix and refrigerate the the morning of the prep. You may also put in the freezer.   You may try mixing some Crystal Light or Country Time Lemonade if you prefer. Mix in small amounts; add more if necessary.  Try drinking through a straw  Rinse mouth with water or a mouthwash between glasses, to remove after-taste.  Try sipping on a cold beverage /ice/ popsicles between glasses of prep.  Place a piece of sugar-free hard candy in mouth between glasses.  If you become nauseated, try consuming smaller amounts, or stretch out the time between glasses. Stop for 30-60 minutes, then slowly start back drinking.     OTHER INSTRUCTIONS  You will need a responsible  adult at least 78 years of age to accompany you and drive you home. This person must remain in the waiting room during your procedure. The hospital will cancel your procedure if you do not have a responsible adult with you.   1. Wear loose fitting clothing that is easily removed. 2. Leave jewelry and other valuables at home.  3. Remove all body piercing jewelry and leave at home. 4. Total time from sign-in until discharge is approximately 2-3 hours. 5. You should go home directly after your procedure and rest. You can resume normal activities the day after your procedure. 6. The day of your procedure you should not:  Drive  Make legal decisions  Operate machinery  Drink alcohol  Return to work   You may call the office (Dept: [phone]) before 5:00pm, or page the doctor on call [phone]) after 5:00pm, for further instructions, if necessary.   Insurance Information YOU WILL NEED TO CHECK WITH YOUR INSURANCE COMPANY FOR THE BENEFITS OF COVERAGE YOU HAVE FOR THIS PROCEDURE.  UNFORTUNATELY, NOT ALL INSURANCE COMPANIES HAVE BENEFITS TO COVER ALL OR PART OF THESE TYPES OF PROCEDURES.  IT IS YOUR RESPONSIBILITY TO CHECK YOUR BENEFITS, HOWEVER, WE WILL BE GLAD TO ASSIST YOU WITH ANY CODES YOUR INSURANCE COMPANY MAY NEED.    PLEASE NOTE THAT MOST INSURANCE COMPANIES WILL NOT COVER A SCREENING COLONOSCOPY FOR PEOPLE UNDER THE AGE OF 50  IF YOU HAVE BCBS INSURANCE, YOU MAY HAVE BENEFITS FOR A SCREENING COLONOSCOPY BUT IF POLYPS ARE FOUND THE DIAGNOSIS WILL CHANGE AND THEN YOU MAY HAVE A DEDUCTIBLE THAT WILL NEED TO BE MET. SO PLEASE MAKE SURE YOU CHECK YOUR BENEFITS FOR A SCREENING COLONOSCOPY AS WELL AS A DIAGNOSTIC COLONOSCOPY.

## 2018-03-09 NOTE — Progress Notes (Signed)
H/o multiple colon polyps with recommendation of follow up 3 yr surveillance colonoscopy after 2016 TCS.  Please ask advise from Dr. Oneida Alar: #1 should we offer TCS in this 77 year old and #2 if we do, should we use propofol as he has had previous TCSs with Britta Mccreedy with anesthesia protocol.

## 2018-03-10 NOTE — Progress Notes (Signed)
Message sent to [facility].

## 2018-03-11 NOTE — Progress Notes (Addendum)
REVIEWED TCS/PATH REPORT. OK TO SCHEDULE TCS W/ MODERATE SEDATION IN 2019 PER RECOMMENDATIONS OF DR. Britta Mccreedy.

## 2018-03-19 ENCOUNTER — Ambulatory Visit (HOSPITAL_COMMUNITY)
Admission: RE | Admit: 2018-03-19 | Discharge: 2018-03-19 | Disposition: A | Discharge status: Home / Self Care | Payer: Medicare HMO | Source: Ambulatory Visit | Attending: Gastroenterology | Admitting: Gastroenterology

## 2018-03-19 ENCOUNTER — Encounter (HOSPITAL_COMMUNITY): Payer: Self-pay

## 2018-03-19 ENCOUNTER — Other Ambulatory Visit: Payer: Self-pay

## 2018-03-19 ENCOUNTER — Encounter (HOSPITAL_COMMUNITY)
Admission: RE | Disposition: A | Discharge status: Home / Self Care | Payer: Self-pay | Source: Ambulatory Visit | Attending: Gastroenterology

## 2018-03-19 DIAGNOSIS — K5730 Diverticulosis of large intestine without perforation or abscess without bleeding: Secondary | ICD-10-CM | POA: Insufficient documentation

## 2018-03-19 DIAGNOSIS — I10 Essential (primary) hypertension: Secondary | ICD-10-CM | POA: Insufficient documentation

## 2018-03-19 DIAGNOSIS — Z8673 Personal history of transient ischemic attack (TIA), and cerebral infarction without residual deficits: Secondary | ICD-10-CM | POA: Diagnosis not present

## 2018-03-19 DIAGNOSIS — Z7982 Long term (current) use of aspirin: Secondary | ICD-10-CM | POA: Diagnosis not present

## 2018-03-19 DIAGNOSIS — Z860100 Personal history of colon polyps, unspecified: Secondary | ICD-10-CM

## 2018-03-19 DIAGNOSIS — E782 Mixed hyperlipidemia: Secondary | ICD-10-CM | POA: Diagnosis not present

## 2018-03-19 DIAGNOSIS — Z87891 Personal history of nicotine dependence: Secondary | ICD-10-CM | POA: Insufficient documentation

## 2018-03-19 DIAGNOSIS — K644 Residual hemorrhoidal skin tags: Secondary | ICD-10-CM | POA: Diagnosis not present

## 2018-03-19 DIAGNOSIS — D123 Benign neoplasm of transverse colon: Secondary | ICD-10-CM | POA: Insufficient documentation

## 2018-03-19 DIAGNOSIS — Z8249 Family history of ischemic heart disease and other diseases of the circulatory system: Secondary | ICD-10-CM | POA: Diagnosis not present

## 2018-03-19 DIAGNOSIS — K648 Other hemorrhoids: Secondary | ICD-10-CM | POA: Diagnosis not present

## 2018-03-19 DIAGNOSIS — D124 Benign neoplasm of descending colon: Secondary | ICD-10-CM | POA: Diagnosis not present

## 2018-03-19 DIAGNOSIS — Z1211 Encounter for screening for malignant neoplasm of colon: Secondary | ICD-10-CM | POA: Diagnosis not present

## 2018-03-19 DIAGNOSIS — Z888 Allergy status to other drugs, medicaments and biological substances status: Secondary | ICD-10-CM | POA: Insufficient documentation

## 2018-03-19 DIAGNOSIS — Z87442 Personal history of urinary calculi: Secondary | ICD-10-CM | POA: Diagnosis not present

## 2018-03-19 DIAGNOSIS — D120 Benign neoplasm of cecum: Secondary | ICD-10-CM | POA: Insufficient documentation

## 2018-03-19 DIAGNOSIS — D122 Benign neoplasm of ascending colon: Secondary | ICD-10-CM | POA: Diagnosis not present

## 2018-03-19 DIAGNOSIS — Z800 Family history of malignant neoplasm of digestive organs: Secondary | ICD-10-CM | POA: Insufficient documentation

## 2018-03-19 DIAGNOSIS — I739 Peripheral vascular disease, unspecified: Secondary | ICD-10-CM | POA: Insufficient documentation

## 2018-03-19 DIAGNOSIS — Z79899 Other long term (current) drug therapy: Secondary | ICD-10-CM | POA: Diagnosis not present

## 2018-03-19 DIAGNOSIS — I2510 Atherosclerotic heart disease of native coronary artery without angina pectoris: Secondary | ICD-10-CM | POA: Diagnosis not present

## 2018-03-19 DIAGNOSIS — J449 Chronic obstructive pulmonary disease, unspecified: Secondary | ICD-10-CM | POA: Insufficient documentation

## 2018-03-19 HISTORY — PX: COLONOSCOPY: SHX5424

## 2018-03-19 HISTORY — PX: POLYPECTOMY: SHX5525

## 2018-03-19 SURGERY — COLONOSCOPY
Anesthesia: Moderate Sedation

## 2018-03-19 MED ORDER — MEPERIDINE HCL 100 MG/ML IJ SOLN
INTRAMUSCULAR | Status: DC | PRN
  Administered 2018-03-19 (×2): 25 mg via INTRAVENOUS

## 2018-03-19 MED ORDER — SODIUM CHLORIDE 0.9 % IV SOLN
INTRAVENOUS | Status: DC
  Administered 2018-03-19: 11:00:00 via INTRAVENOUS

## 2018-03-19 MED ORDER — MIDAZOLAM HCL 5 MG/5ML IJ SOLN
INTRAMUSCULAR | Status: AC
  Filled 2018-03-19: qty 10

## 2018-03-19 MED ORDER — MEPERIDINE HCL 100 MG/ML IJ SOLN
INTRAMUSCULAR | Status: AC
  Filled 2018-03-19: qty 2

## 2018-03-19 MED ORDER — MIDAZOLAM HCL 5 MG/5ML IJ SOLN
INTRAMUSCULAR | Status: DC | PRN
  Administered 2018-03-19 (×2): 2 mg via INTRAVENOUS

## 2018-03-19 NOTE — Op Note (Signed)
[facility] Patient Name: Hector Torres Procedure Date: 03/19/2018 11:29 AM MRN: [id number] Date of Birth: [birth year] Attending MD: Barney Drain MD, MD CSN: [id number] Age: 77 Admit Type: Outpatient Procedure:                Colonoscopy with COLD FORCEPS/SNARE AND SNARE                            CAUTERY POLYPECTOMY Indications:              Personal history of colonic polyps Providers:                Barney Drain MD, MD, Charlsie Quest. Theda Sers RN, RN,                            Aram Candela Referring MD:             Koleen Distance. Gottschalk Medicines:                Meperidine 50 mg IV, Midazolam 4 mg IV Complications:            No immediate complications. Estimated Blood Loss:     Estimated blood loss was minimal. Procedure:                Pre-Anesthesia Assessment:                           - Prior to the procedure, a History and Physical                            was performed, and patient medications and                            allergies were reviewed. The patient's tolerance of                            previous anesthesia was also reviewed. The risks                            and benefits of the procedure and the sedation                            options and risks were discussed with the patient.                            All questions were answered, and informed consent                            was obtained. Prior Anticoagulants: The patient has                            taken aspirin, last dose was 7 days prior to                            procedure. ASA Grade Assessment: II - A patient  with mild systemic disease. After reviewing the                            risks and benefits, the patient was deemed in                            satisfactory condition to undergo the procedure.                            After obtaining informed consent, the colonoscope                            was passed under direct vision. Throughout the                 procedure, the patient's blood pressure, pulse, and                            oxygen saturations were monitored continuously. The                            CF-[id number] ([id number]) scope was introduced through                            the anus and advanced to the the cecum, identified                            by appendiceal orifice and ileocecal valve. The                            colonoscopy was technically difficult and complex                            due to significant looping. Successful completion                            of the procedure was aided by changing the patient                            to a supine position, using manual pressure,                            straightening and shortening the scope to obtain                            bowel loop reduction and COLOWRAP. The patient                            tolerated the procedure well. The quality of the                            bowel preparation was good. The ileocecal valve,  appendiceal orifice, and rectum were photographed. Scope In: 12:34:13 PM Scope Out: 1:02:23 PM Scope Withdrawal Time: 0 hours 19 minutes 13 seconds  Total Procedure Duration: 0 hours 28 minutes 10 seconds  Findings:      Four sessile polyps were found in the transverse colon, ascending colon       and cecum. The polyps were 3 to 6 mm in size. These polyps were removed       with a cold snare. Resection and retrieval were complete.      Two sessile polyps were found in the transverse colon. The polyps were 5       to 7 mm in size. These polyps were removed with a hot snare. Resection       and retrieval were complete.      Three sessile polyps were found in the descending colon and ascending       colon. The polyps were 2 to 4 mm in size. These polyps were removed with       a cold biopsy forceps. Resection and retrieval were complete.      Multiple small and large-mouthed diverticula were found in the        recto-sigmoid colon and sigmoid colon.      The recto-sigmoid colon, sigmoid colon and descending colon revealed       significantly excessive looping.      External and internal hemorrhoids were found. The hemorrhoids were       moderate. Impression:               - Four 3 to 6 mm polyps in the transverse colon(2),                            in the ascending colon and in the cecum, removed                            with a cold snare. Resected and retrieved.                           - Two 5 to 7 mm polyps in the transverse colon,                            removed with a hot snare. Resected and retrieved.                           - THREE 2 to 4 mm polyps in the descending colon                            and in the ascending colon(2), removed with a cold                            biopsy forceps. Resected and retrieved.                           - Diverticulosis in the recto-sigmoid colon and in                            the sigmoid colon.                           -  There was significant looping of the colon.                           - External and internal hemorrhoids. Moderate Sedation:      Moderate (conscious) sedation was administered by the endoscopy nurse       and supervised by the endoscopist. The following parameters were       monitored: oxygen saturation, heart rate, blood pressure, and response       to care. Total physician intraservice time was 37 minutes. Recommendation:           - Patient has a contact number available for                            emergencies. The signs and symptoms of potential                            delayed complications were discussed with the                            patient. Return to normal activities tomorrow.                            Written discharge instructions were provided to the                            patient.                           - High fiber diet.                           - Continue present medications.                            - Await pathology results.                           - Repeat colonoscopy is not recommended due to                            current age (46 years or older) for surveillance. Procedure Code(s):        --- Professional ---                           [phone], Colonoscopy, flexible; with removal of                            tumor(s), polyp(s), or other lesion(s) by snare                            technique                           55732, 75, Colonoscopy, flexible; with biopsy,  single or multiple                           G0500, Moderate sedation services provided by the                            same physician or other qualified health care                            professional performing a gastrointestinal                            endoscopic service that sedation supports,                            requiring the presence of an independent trained                            observer to assist in the monitoring of the                            patient's level of consciousness and physiological                            status; initial 15 minutes of intra-service time;                            patient age 52 years or older (additional time may                            be reported with [phone], as appropriate)                           [phone], Moderate sedation services provided by the                            same physician or other qualified health care                            professional performing the diagnostic or                            therapeutic service that the sedation supports,                            requiring the presence of an independent trained                            observer to assist in the monitoring of the                            patient's level of consciousness and physiological                            status;  each additional 15 minutes intraservice                            time (List  separately in addition to code for                            primary service) Diagnosis Code(s):        --- Professional ---                           D12.0, Benign neoplasm of cecum                           D12.3, Benign neoplasm of transverse colon (hepatic                            flexure or splenic flexure)                           D12.4, Benign neoplasm of descending colon                           D12.2, Benign neoplasm of ascending colon                           K64.8, Other hemorrhoids                           Z86.010, Personal history of colonic polyps                           K57.30, Diverticulosis of large intestine without                            perforation or abscess without bleeding CPT copyright 2017 American Medical Association. All rights reserved. The codes documented in this report are preliminary and upon coder review may  be revised to meet current compliance requirements. Barney Drain, MD Barney Drain MD, MD 03/19/2018 1:21:41 PM This report has been signed electronically. Number of Addenda: 0

## 2018-03-19 NOTE — Discharge Instructions (Signed)
You have MODERATE internal hemorrhoids and diverticulosis IN YOUR LEFT RIGHT COLON. YOU HAD NINE POLYPS REMOVED.    DRINK WATER TO KEEP YOUR URINE LIGHT YELLOW.  CONTINUE YOUR WEIGHT LOSS EFFORTS. YOUR BODY MASS INDEX IS OVER 30 WHICH MEANS YOU ARE OBESE. OBESITY IS ASSOCIATED WITH AN INCREASE FOR ALL CANCERS, INCLUDING ESOPHAGEAL AND COLON CANCER. A WEIGHT OF  205 LBS OR LESS  WILL GET YOUR BODY MASS INDEX(BMI) UNDER 30.   FOLLOW A HIGH FIBER DIET. AVOID ITEMS THAT CAUSE BLOATING. See info below.  YOUR BIOPSY RESULTS WILL BE AVAILABLE IN 7 DAYS.  USE PREPARATION H FOUR TIMES  A DAY IF NEEDED TO RELIEVE RECTAL PAIN/PRESSURE/BLEEDING.\  We do not routinely screen for polyps after the age of 80.   Colonoscopy Care After Read the instructions outlined below and refer to this sheet in the next week. These discharge instructions provide you with general information on caring for yourself after you leave the hospital. While your treatment has been planned according to the most current medical practices available, unavoidable complications occasionally occur. If you have any problems or questions after discharge, call DR. Mariacristina Aday, [phone].  ACTIVITY  You may resume your regular activity, but move at a slower pace for the next 24 hours.   Take frequent rest periods for the next 24 hours.   Walking will help get rid of the air and reduce the bloated feeling in your belly (abdomen).   No driving for 24 hours (because of the medicine (anesthesia) used during the test).   You may shower.   Do not sign any important legal documents or operate any machinery for 24 hours (because of the anesthesia used during the test).    NUTRITION  Drink plenty of fluids.   You may resume your normal diet as instructed by your doctor.   Begin with a light meal and progress to your normal diet. Heavy or fried foods are harder to digest and may make you feel sick to your stomach (nauseated).   Avoid  alcoholic beverages for 24 hours or as instructed.    MEDICATIONS  You may resume your normal medications.   WHAT YOU CAN EXPECT TODAY  Some feelings of bloating in the abdomen.   Passage of more gas than usual.   Spotting of blood in your stool or on the toilet paper  .  IF YOU HAD POLYPS REMOVED DURING THE COLONOSCOPY:  Eat a soft diet IF YOU HAVE NAUSEA, BLOATING, ABDOMINAL PAIN, OR VOMITING.    FINDING OUT THE RESULTS OF YOUR TEST Not all test results are available during your visit. DR. Oneida Alar WILL CALL YOU WITHIN 14 DAYS OF YOUR PROCEDUE WITH YOUR RESULTS. Do not assume everything is normal if you have not heard from DR. Nikholas Geffre, CALL HER OFFICE AT [phone].  SEEK IMMEDIATE MEDICAL ATTENTION AND CALL THE OFFICE: [phone] IF:  You have more than a spotting of blood in your stool.   Your belly is swollen (abdominal distention).   You are nauseated or vomiting.   You have a temperature over 101F.   You have abdominal pain or discomfort that is severe or gets worse throughout the day.  High-Fiber Diet A high-fiber diet changes your normal diet to include more whole grains, legumes, fruits, and vegetables. Changes in the diet involve replacing refined carbohydrates with unrefined foods. The calorie level of the diet is essentially unchanged. The Dietary Reference Intake (recommended amount) for adult males is 38 grams per day. For adult females,  it is 25 grams per day. Pregnant and lactating women should consume 28 grams of fiber per day. Fiber is the intact part of a plant that is not broken down during digestion. Functional fiber is fiber that has been isolated from the plant to provide a beneficial effect in the body. PURPOSE  Increase stool bulk.   Ease and regulate bowel movements.   Lower cholesterol.   REDUCE RISK OF COLON CANCER  INDICATIONS THAT YOU NEED MORE FIBER  Constipation and hemorrhoids.   Uncomplicated diverticulosis (intestine  condition) and irritable bowel syndrome.   Weight management.   As a protective measure against hardening of the arteries (atherosclerosis), diabetes, and cancer.   GUIDELINES FOR INCREASING FIBER IN THE DIET  Start adding fiber to the diet slowly. A gradual increase of about 5 more grams (2 slices of whole-wheat bread, 2 servings of most fruits or vegetables, or 1 bowl of high-fiber cereal) per day is best. Too rapid an increase in fiber may result in constipation, flatulence, and bloating.   Drink enough water and fluids to keep your urine clear or pale yellow. Water, juice, or caffeine-free drinks are recommended. Not drinking enough fluid may cause constipation.   Eat a variety of high-fiber foods rather than one type of fiber.   Try to increase your intake of fiber through using high-fiber foods rather than fiber pills or supplements that contain small amounts of fiber.   The goal is to change the types of food eaten. Do not supplement your present diet with high-fiber foods, but replace foods in your present diet.   INCLUDE A VARIETY OF FIBER SOURCES  Replace refined and processed grains with whole grains, canned fruits with fresh fruits, and incorporate other fiber sources. White rice, white breads, and most bakery goods contain little or no fiber.   Brown whole-grain rice, buckwheat oats, and many fruits and vegetables are all good sources of fiber. These include: broccoli, Brussels sprouts, cabbage, cauliflower, beets, sweet potatoes, white potatoes (skin on), carrots, tomatoes, eggplant, squash, berries, fresh fruits, and dried fruits.   Cereals appear to be the richest source of fiber. Cereal fiber is found in whole grains and bran. Bran is the fiber-rich outer coat of cereal grain, which is largely removed in refining. In whole-grain cereals, the bran remains. In breakfast cereals, the largest amount of fiber is found in those with "bran" in their names. The fiber content is  sometimes indicated on the label.   You may need to include additional fruits and vegetables each day.   In baking, for 1 cup white flour, you may use the following substitutions:   1 cup whole-wheat flour minus 2 tablespoons.   1/2 cup white flour plus 1/2 cup whole-wheat flour.   Polyps, Colon  A polyp is extra tissue that grows inside your body. Colon polyps grow in the large intestine. The large intestine, also called the colon, is part of your digestive system. It is a long, hollow tube at the end of your digestive tract where your body makes and stores stool. Most polyps are not dangerous. They are benign. This means they are not cancerous. But over time, some types of polyps can turn into cancer. Polyps that are smaller than a pea are usually not harmful. But larger polyps could someday become or may already be cancerous. To be safe, doctors remove all polyps and test them.   PREVENTION There is not one sure way to prevent polyps. You might be able to lower  your risk of getting them if you:  Eat more fruits and vegetables and less fatty food.   Do not smoke.   Avoid alcohol.   Exercise every day.   Lose weight if you are overweight.   Eating more calcium and folate can also lower your risk of getting polyps. Some foods that are rich in calcium are milk, cheese, and broccoli. Some foods that are rich in folate are chickpeas, kidney beans, and spinach.    Diverticulosis Diverticulosis is a common condition that develops when small pouches (diverticula) form in the wall of the colon. The risk of diverticulosis increases with age. It happens more often in people who eat a low-fiber diet. Most individuals with diverticulosis have no symptoms. Those individuals with symptoms usually experience belly (abdominal) pain, constipation, or loose stools (diarrhea).  HOME CARE INSTRUCTIONS  Increase the amount of fiber in your diet as directed by your caregiver or dietician. This may  reduce symptoms of diverticulosis.   Drink at least 6 to 8 glasses of water each day to prevent constipation.   Try not to strain when you have a bowel movement.   Avoiding nuts and seeds to prevent complications is NOT NECESSARY.   FOODS HAVING HIGH FIBER CONTENT INCLUDE:  Fruits. Apple, peach, pear, tangerine, raisins, prunes.   Vegetables. Brussels sprouts, asparagus, broccoli, cabbage, carrot, cauliflower, romaine lettuce, spinach, summer squash, tomato, winter squash, zucchini.   Starchy Vegetables. Baked beans, kidney beans, lima beans, split peas, lentils, potatoes (with skin).   Grains. Whole wheat bread, brown rice, bran flake cereal, plain oatmeal, white rice, shredded wheat, bran muffins.   SEEK IMMEDIATE MEDICAL CARE IF:  You develop increasing pain or severe bloating.   You have an oral temperature above 101F.   You develop vomiting or bowel movements that are bloody or black.

## 2018-03-19 NOTE — H&P (Signed)
Primary Care Physician:  Janora Norlander, DO Primary Gastroenterologist:  Dr. Oneida Alar  Pre-Procedure History & Physical: HPI:  Hector Torres is a 77 y.o. male here for  PERSONAL HISTORY OF POLYPS.  Past Medical History:  Diagnosis Date  . Arthritis   . BPH (benign prostatic hypertrophy)   . Carotid artery disease (HCC)    Bilateral - Dr. Bridgett Larsson  . Colon polyp   . COPD (chronic obstructive pulmonary disease) ([place])   . Coronary atherosclerosis of native coronary artery    PTCA circumflex at [facility], nonobstructive residual disease at catheterization June and October 2016  . Essential hypertension, benign    Possible white coat  . GERD (gastroesophageal reflux disease)   . History of nephrolithiasis   . Mixed hyperlipidemia   . Prostate nodule   . PVD (peripheral vascular disease) ([place])   . TIA (transient ischemic attack)   . Vitamin D deficiency     Past Surgical History:  Procedure Laterality Date  . ANGIOPLASTY Left 1995   Cir. Artery  . APPENDECTOMY  1947  . CARDIAC CATHETERIZATION N/A 02/03/2015   Procedure: Left Heart Cath and Cors/Grafts Angiography;  Surgeon: Sherren Mocha, MD;  Location: [facility];  Service: Cardiovascular;  Laterality: N/A;  . CARDIAC CATHETERIZATION N/A 05/19/2015   Procedure: Left Heart Cath and Coronary Angiography;  Surgeon: Troy Sine, MD;  Location: [facility];  Service: Cardiovascular;  Laterality: N/A;  . EYE SURGERY     Bilateral cataract  . TONSILLECTOMY  1968  . Traumatic injury  1962   Lost tip of index, middle, and ring finger/ right hand    Prior to Admission medications   Medication Sig Start Date End Date Taking? Authorizing Provider  aspirin 81 MG tablet Take 81 mg by mouth daily.   Yes [provider]  carvedilol (COREG) 6.25 MG tablet Take 1 tablet (6.25 mg total) by mouth 2 (two) times daily with a meal. 01/02/18  Yes Gottschalk, Ashly M, DO  diphenhydramine-acetaminophen (TYLENOL PM) 25-500  MG TABS tablet Take 2 tablets by mouth at bedtime as needed (sleep/pain).   Yes [provider]  hydrochlorothiazide (MICROZIDE) 12.5 MG capsule Take 1 capsule (12.5 mg total) by mouth daily. 01/20/18 04/20/18 Yes Satira Sark, MD  irbesartan (AVAPRO) 75 MG tablet Take 1 tablet (75 mg total) by mouth daily. 01/05/18  Yes Satira Sark, MD  isosorbide mononitrate (IMDUR) 30 MG 24 hr tablet Take 1 tablet (30 mg total) by mouth daily. 01/05/18  Yes Satira Sark, MD  Multiple Vitamin (MULTIVITAMIN PO) Take 1 tablet by mouth daily.     Yes [provider]  Na Sulfate-K Sulfate-Mg Sulf (SUPREP BOWEL PREP KIT) 17.5-3.13-1.6 GM/177ML SOLN Take 1 kit by mouth as directed. 03/09/18  Yes Mahala Menghini, PA-C  rosuvastatin (CRESTOR) 20 MG tablet Take 1 tablet (20 mg total) by mouth daily. 01/02/18  Yes Gottschalk, Ashly M, DO  umeclidinium-vilanterol (ANORO ELLIPTA) 62.5-25 MCG/INH AEPB INHALE 1 PUFF INTO LUNGS DAILY 01/02/18  Yes Ronnie Doss M, DO    Allergies as of 03/09/2018 - Review Complete 03/09/2018  Allergen Reaction Noted  . Norvasc [amlodipine besylate] Swelling 08/28/2017    Family History  Problem Relation Age of Onset  . Gastric cancer Mother   . Varicose Veins Mother   . Cancer Mother        Stomach  . Prostate cancer Father   . Cancer Father  Prostate  . Other Brother        carotid artery stenosis  . Diabetes Brother   . Leukemia Brother   . Hyperlipidemia Brother   . Hypertension Brother   . Colon cancer Brother   . Hyperlipidemia Brother   . Peripheral vascular disease Brother   . Cancer Brother        Colon and Prostate  . Hypertension Sister   . Hyperlipidemia Sister   . Diabetes Daughter   . Diabetes Son   . Prostate cancer Brother     Social History   Socioeconomic History  . Marital status: Widowed    Spouse name: Not on file  . Number of children: Not on file  . Years of education: Not on file  . Highest education  level: Not on file  Occupational History  . Not on file  Social Needs  . Financial resource strain: Not on file  . Food insecurity:    Worry: Not on file    Inability: Not on file  . Transportation needs:    Medical: Not on file    Non-medical: Not on file  Tobacco Use  . Smoking status: Former Smoker    Packs/day: 1.50    Years: 60.00    Pack years: 90.00    Types: Cigarettes    Last attempt to quit: 02/27/2011    Years since quitting: 7.0  . Smokeless tobacco: Never Used  Substance and Sexual Activity  . Alcohol use: No  . Drug use: No  . Sexual activity: Not on file  Lifestyle  . Physical activity:    Days per week: Not on file    Minutes per session: Not on file  . Stress: Not on file  Relationships  . Social connections:    Talks on phone: Not on file    Gets together: Not on file    Attends religious service: Not on file    Active member of club or organization: Not on file    Attends meetings of clubs or organizations: Not on file    Relationship status: Not on file  . Intimate partner violence:    Fear of current or ex partner: Not on file    Emotionally abused: Not on file    Physically abused: Not on file    Forced sexual activity: Not on file  Other Topics Concern  . Not on file  Social History Narrative  . Not on file    Review of Systems: See HPI, otherwise negative ROS   Physical Exam: BP (!) 148/73   Pulse (!) 58   Temp (!) 97.5 F (36.4 C) (Oral)   Resp 18   Ht 5' 10" (1.778 m)   Wt 210 lb (95.3 kg)   SpO2 97%   BMI 30.13 kg/m  General:   Alert,  pleasant and cooperative in NAD Head:  Normocephalic and atraumatic. Neck:  Supple; Lungs:  Clear throughout to auscultation.    Heart:  Regular rate and rhythm. Abdomen:  Soft, nontender and nondistended. Normal bowel sounds, without guarding, and without rebound.   Neurologic:  Alert and  oriented x4;  grossly normal neurologically.  Impression/Plan:     PERSONAL HISTORY OF  POLYPS.  PLAN: 1. TCS TODAY. DISCUSSED PROCEDURE, BENEFITS, & RISKS: < 1% chance of medication reaction, bleeding, perforation, or rupture of spleen/liver.

## 2018-03-24 ENCOUNTER — Encounter (HOSPITAL_COMMUNITY): Payer: Self-pay | Admitting: Gastroenterology

## 2018-03-26 NOTE — Progress Notes (Signed)
PT is aware.

## 2018-04-06 DIAGNOSIS — H35371 Puckering of macula, right eye: Secondary | ICD-10-CM | POA: Diagnosis not present

## 2018-04-06 DIAGNOSIS — H26491 Other secondary cataract, right eye: Secondary | ICD-10-CM | POA: Diagnosis not present

## 2018-04-06 DIAGNOSIS — H04123 Dry eye syndrome of bilateral lacrimal glands: Secondary | ICD-10-CM | POA: Diagnosis not present

## 2018-04-06 DIAGNOSIS — Z961 Presence of intraocular lens: Secondary | ICD-10-CM | POA: Diagnosis not present

## 2018-04-15 ENCOUNTER — Other Ambulatory Visit: Payer: Self-pay | Admitting: Cardiology

## 2018-05-26 IMAGING — US US RENAL
1 series · 14 of 25 positions shown · non-contrast
Comparison: None.

CLINICAL DATA: Hematuria

EXAM:
RENAL / URINARY TRACT ULTRASOUND COMPLETE

[Series 1: us renal · 0.23mm/px · 14 of 31 slices shown]
[im 1/31]
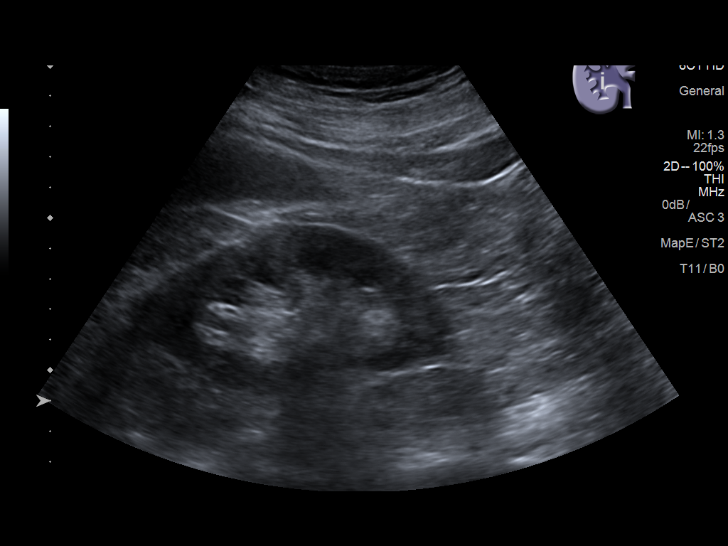
[im 3/31]
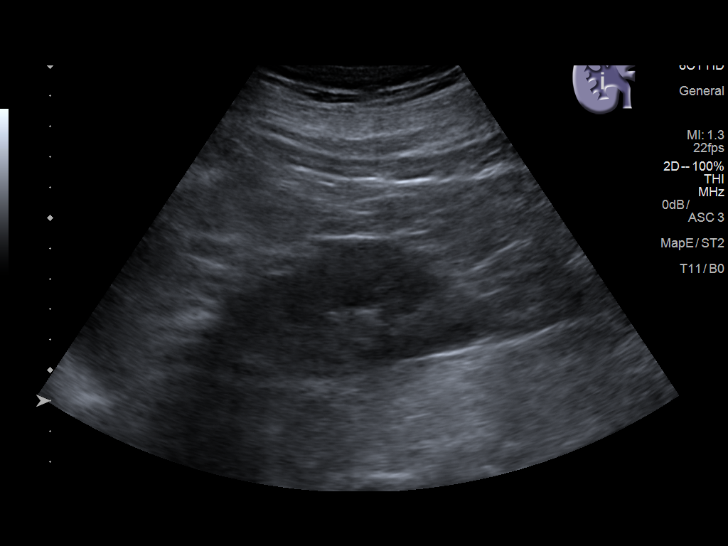
[im 6/31]
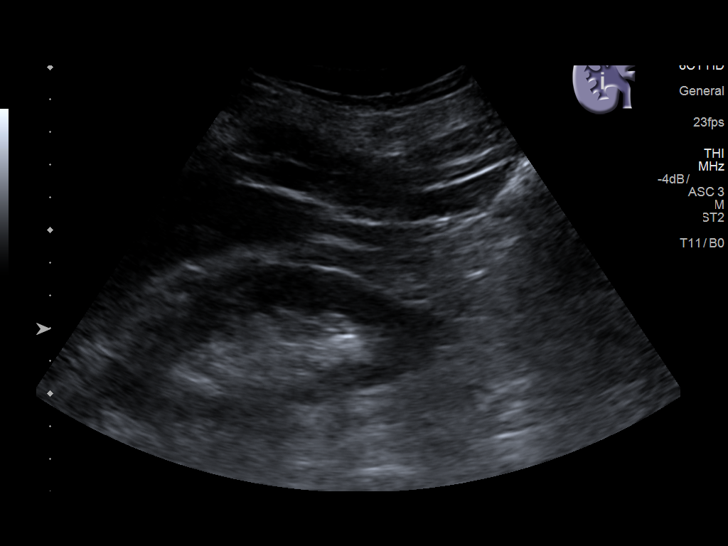
[im 8/31]
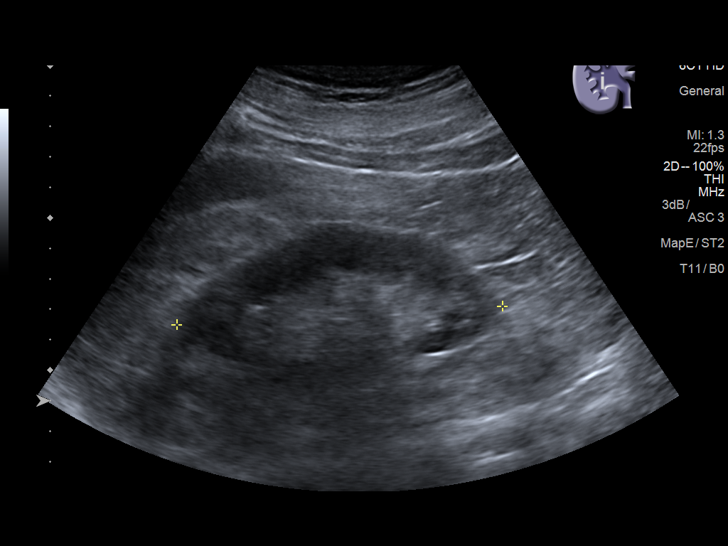
[im 11/31]
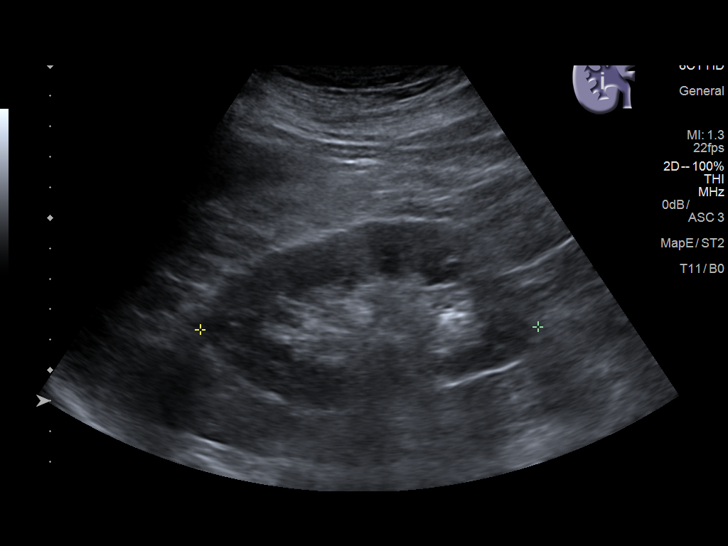
[im 12/31]
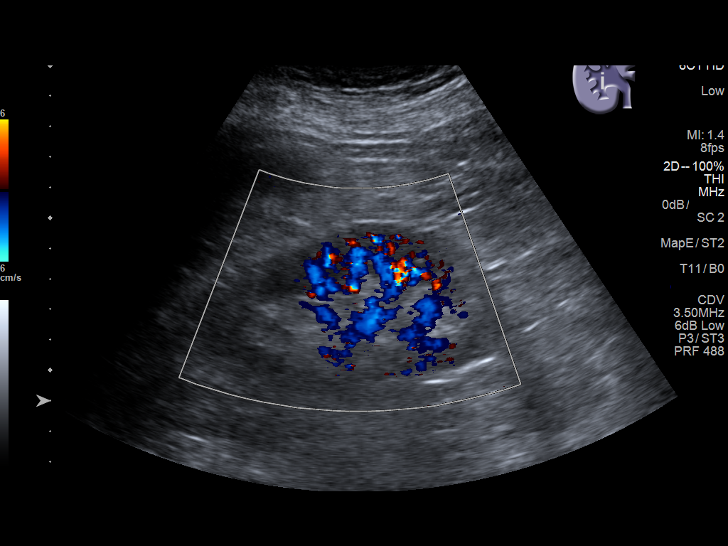
[im 14/31]
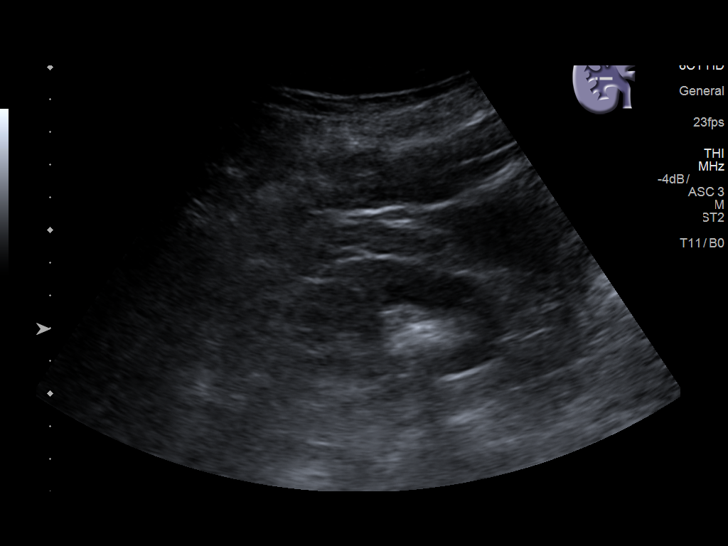
[im 17/31]
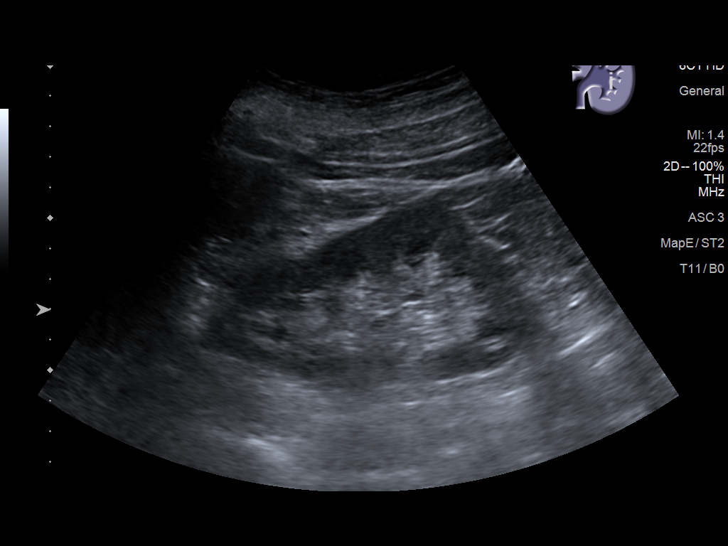
[im 19/31]
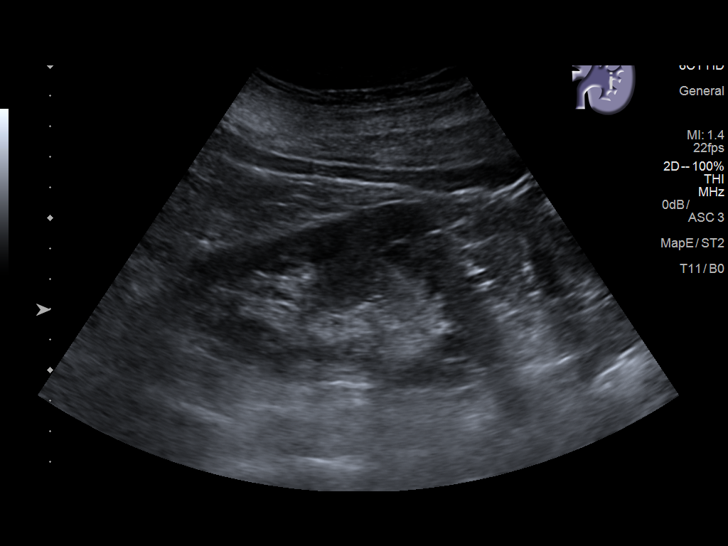
[im 21/31]
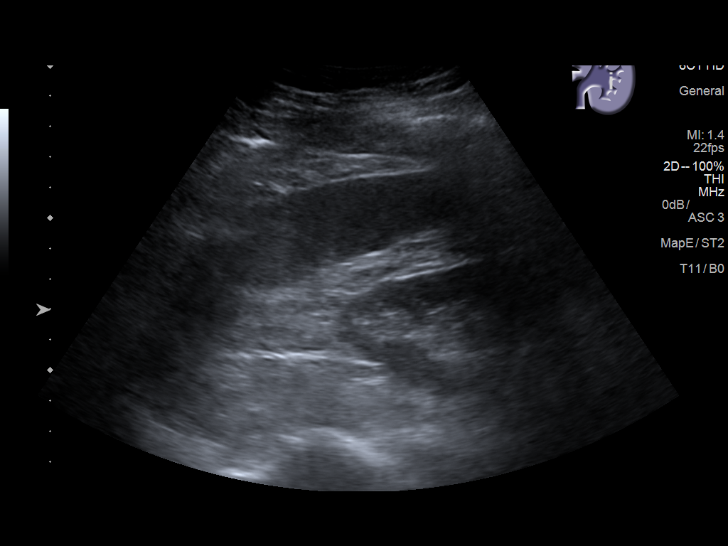
[im 23/31]
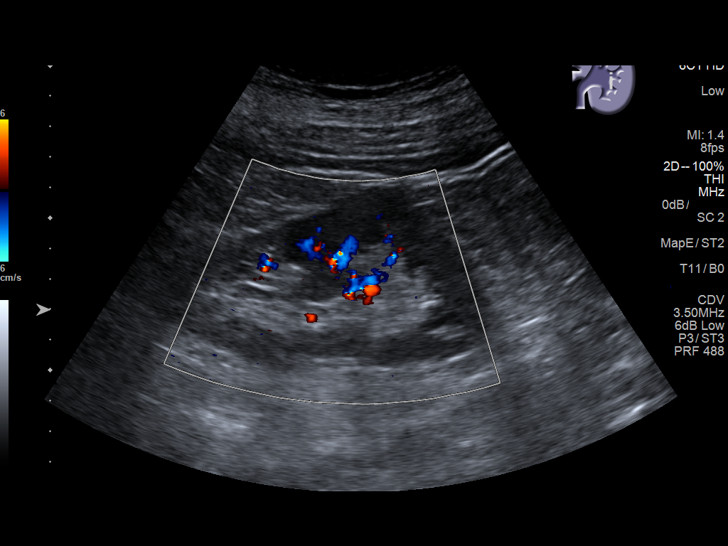
[im 26/31]
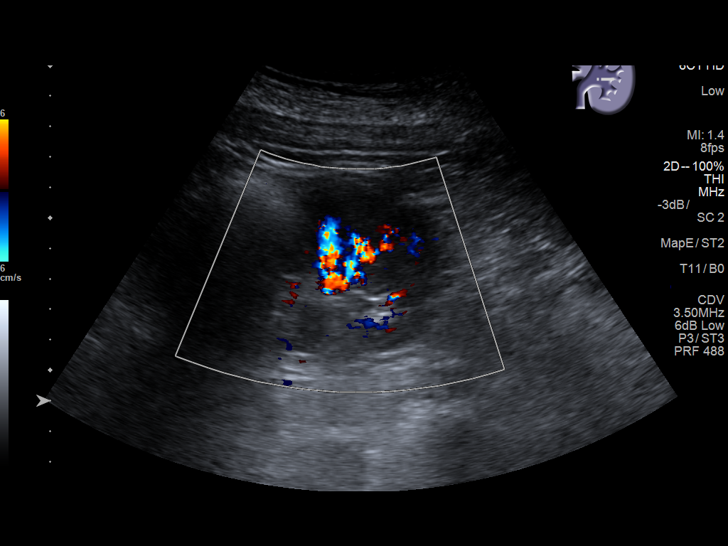
[im 28/31]
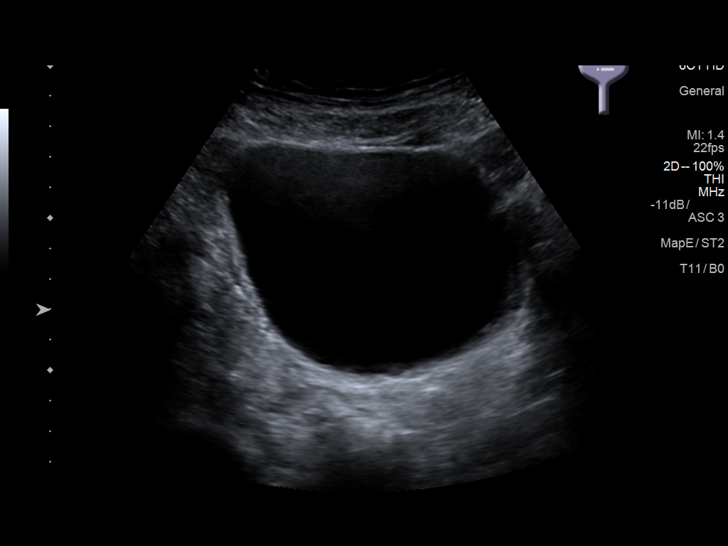
[im 31/31]
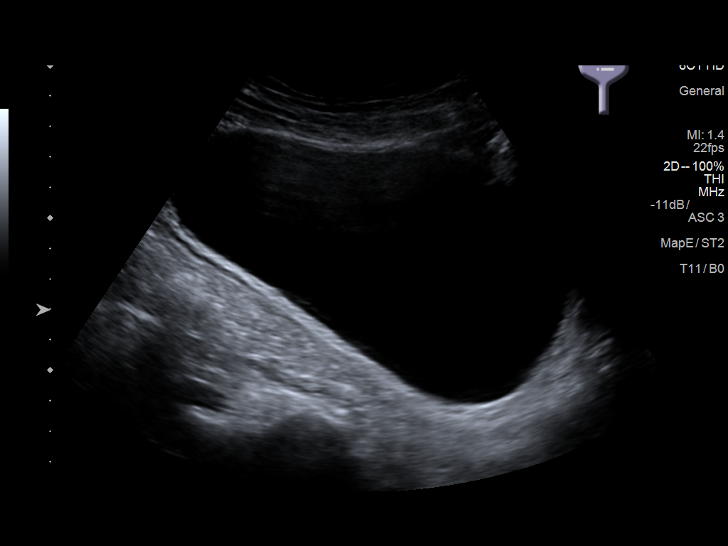

[14 of 25 positions shown; findings below may reference images not displayed]

FINDINGS: Right Kidney:

Length: 11.1 cm.. No hydronephrosis is noted. Calculus is noted in
the lower pole of the right kidney measuring 12 mm.

Left Kidney:

Length: 11 cm.. Echogenicity within normal limits. No mass or
hydronephrosis visualized.

Bladder:

Appears normal for degree of bladder distention.
IMPRESSION: Right renal calculus without obstructive change.

## 2018-08-20 ENCOUNTER — Other Ambulatory Visit: Payer: Self-pay | Admitting: Cardiology

## 2018-08-21 ENCOUNTER — Other Ambulatory Visit: Payer: Self-pay | Admitting: *Deleted

## 2018-08-21 NOTE — Telephone Encounter (Signed)
Gottschalk. NTBS. Last OV for chronic condition 08/2017

## 2018-08-21 NOTE — Telephone Encounter (Signed)
Pt scheduled with Dr Darnell Level 1/10 at 1 pm.

## 2018-08-28 ENCOUNTER — Ambulatory Visit (INDEPENDENT_AMBULATORY_CARE_PROVIDER_SITE_OTHER): Payer: Medicare HMO | Admitting: Family Medicine

## 2018-08-28 VITALS — BP 100/64 | HR 100 | Temp 98.1°F | Ht 70.0 in | Wt 202.0 lb

## 2018-08-28 DIAGNOSIS — E782 Mixed hyperlipidemia: Secondary | ICD-10-CM | POA: Diagnosis not present

## 2018-08-28 DIAGNOSIS — I2510 Atherosclerotic heart disease of native coronary artery without angina pectoris: Secondary | ICD-10-CM

## 2018-08-28 DIAGNOSIS — N400 Enlarged prostate without lower urinary tract symptoms: Secondary | ICD-10-CM | POA: Diagnosis not present

## 2018-08-28 DIAGNOSIS — Z23 Encounter for immunization: Secondary | ICD-10-CM | POA: Diagnosis not present

## 2018-08-28 DIAGNOSIS — J449 Chronic obstructive pulmonary disease, unspecified: Secondary | ICD-10-CM | POA: Diagnosis not present

## 2018-08-28 DIAGNOSIS — E559 Vitamin D deficiency, unspecified: Secondary | ICD-10-CM | POA: Diagnosis not present

## 2018-08-28 DIAGNOSIS — Z125 Encounter for screening for malignant neoplasm of prostate: Secondary | ICD-10-CM

## 2018-08-28 DIAGNOSIS — I10 Essential (primary) hypertension: Secondary | ICD-10-CM

## 2018-08-28 NOTE — Patient Instructions (Signed)
You had labs performed today.  You will be contacted with the results of the labs once they are available, usually in the next 3 business days for routine lab work.   

## 2018-08-28 NOTE — Progress Notes (Signed)
Subjective: CC: HTN HPI: Hector Torres is a 78 y.o. male presenting to clinic today for:  1. Hypertension with hyperlipidemia/ CAD Reports he has been doing well from a blood pressure standpoint.  He reports compliance with his antihypertensives and Crestor and continues to follow-up with his cardiologist as directed.  He does intermittently have some sternal pain that is intermittent and resolves with Motrin.  He notes that his cardiologist is aware of this and does not think it is cardiac in nature.  Denies headache, dizziness, visual changes, nausea, vomiting, LE swelling, abdominal pain or shortness of breath outside of baseline.  2. COPD Patient reports good response to Anoro and is compliant with the medication but states that when he went into the donut hole in November he had spaced his doses to every other day.  Denies any coughing, hemoptysis, unplanned weight loss.  He does have some dyspnea but this is chronic and unchanged.   ROS: Per HPI  Past Medical History:  Diagnosis Date  . Arthritis   . BPH (benign prostatic hypertrophy)   . Carotid artery disease (HCC)    Bilateral - Dr. Bridgett Larsson  . Colon polyp   . COPD (chronic obstructive pulmonary disease) ([place])   . Coronary atherosclerosis of native coronary artery    PTCA circumflex at [facility], nonobstructive residual disease at catheterization June and October 2016  . Essential hypertension, benign    Possible white coat  . GERD (gastroesophageal reflux disease)   . History of nephrolithiasis   . Mixed hyperlipidemia   . Prostate nodule   . PVD (peripheral vascular disease) ([place])   . TIA (transient ischemic attack)   . Vitamin D deficiency    Allergies  Allergen Reactions  . Norvasc [Amlodipine Besylate] Swelling and Other (See Comments)    LE swelling    Current Outpatient Medications:  .  aspirin 81 MG tablet, Take 81 mg by mouth daily., Disp: , Rfl:  .  carvedilol (COREG) 6.25 MG tablet, Take 1 tablet (6.25  mg total) by mouth 2 (two) times daily with a meal., Disp: 180 tablet, Rfl: 4 .  diphenhydramine-acetaminophen (TYLENOL PM) 25-500 MG TABS tablet, Take 2 tablets by mouth at bedtime as needed (sleep/pain)., Disp: , Rfl:  .  hydrochlorothiazide (MICROZIDE) 12.5 MG capsule, TAKE 1 CAPSULE EVERY DAY, Disp: 90 capsule, Rfl: 1 .  irbesartan (AVAPRO) 75 MG tablet, TAKE 1 TABLET EVERY DAY, Disp: 90 tablet, Rfl: 1 .  isosorbide mononitrate (IMDUR) 30 MG 24 hr tablet, TAKE 1 TABLET EVERY DAY, Disp: 90 tablet, Rfl: 1 .  Multiple Vitamin (MULTIVITAMIN PO), Take 1 tablet by mouth daily.  , Disp: , Rfl:  .  rosuvastatin (CRESTOR) 20 MG tablet, Take 1 tablet (20 mg total) by mouth daily., Disp: 90 tablet, Rfl: 4 .  umeclidinium-vilanterol (ANORO ELLIPTA) 62.5-25 MCG/INH AEPB, INHALE 1 PUFF INTO LUNGS DAILY, Disp: 180 each, Rfl: 4  Current Facility-Administered Medications:  .  methylPREDNISolone acetate (DEPO-MEDROL) injection 40 mg, 40 mg, Intra-articular, Once, Janora Norlander, DO Social History   Socioeconomic History  . Marital status: Widowed    Spouse name: Not on file  . Number of children: Not on file  . Years of education: Not on file  . Highest education level: Not on file  Occupational History  . Not on file  Social Needs  . Financial resource strain: Not on file  . Food insecurity:    Worry: Not on file    Inability: Not on file  .  Transportation needs:    Medical: Not on file    Non-medical: Not on file  Tobacco Use  . Smoking status: Former Smoker    Packs/day: 1.50    Years: 60.00    Pack years: 90.00    Types: Cigarettes    Last attempt to quit: 02/27/2011    Years since quitting: 7.5  . Smokeless tobacco: Never Used  Substance and Sexual Activity  . Alcohol use: No  . Drug use: No  . Sexual activity: Not on file  Lifestyle  . Physical activity:    Days per week: Not on file    Minutes per session: Not on file  . Stress: Not on file  Relationships  . Social  connections:    Talks on phone: Not on file    Gets together: Not on file    Attends religious service: Not on file    Active member of club or organization: Not on file    Attends meetings of clubs or organizations: Not on file    Relationship status: Not on file  . Intimate partner violence:    Fear of current or ex partner: Not on file    Emotionally abused: Not on file    Physically abused: Not on file    Forced sexual activity: Not on file  Other Topics Concern  . Not on file  Social History Narrative  . Not on file   Family History  Problem Relation Age of Onset  . Gastric cancer Mother   . Varicose Veins Mother   . Cancer Mother        Stomach  . Prostate cancer Father   . Cancer Father        Prostate  . Other Brother        carotid artery stenosis  . Diabetes Brother   . Leukemia Brother   . Hyperlipidemia Brother   . Hypertension Brother   . Colon cancer Brother   . Hyperlipidemia Brother   . Peripheral vascular disease Brother   . Cancer Brother        Colon and Prostate  . Hypertension Sister   . Hyperlipidemia Sister   . Diabetes Daughter   . Diabetes Son   . Prostate cancer Brother     Objective: Office vital signs reviewed. BP 100/64   Pulse 100   Temp 98.1 F (36.7 C) (Oral)   Ht 5' 10" (1.778 m)   Wt 202 lb (91.6 kg)   BMI 28.98 kg/m   Physical Examination:  General: Awake, alert, well nourished, well appearing, No acute distress Cardio: RRR, S1-S2 heard.  Systolic murmur appreciated at the LSB. Pulm: Globally decreased breath sounds.  No wheezes, rhonchi or rales.  Normal work of breathing on room air. Extremities: Warm, well-perfused, no edema. MSK: No tenderness palpation to the chest.  No palpable bony abnormalities.  Assessment/ Plan: 78 y.o. male   1. Mixed hyperlipidemia Patient is fasting.  Check fasting lipid panel.  Continue Crestor and antihypertensives.  Continue to follow-up with cardiology as directed. - CMP14+EGFR -  Lipid Panel  2. Benign prostatic hyperplasia, unspecified whether lower urinary tract symptoms present - PSA  3. Atherosclerosis of native coronary artery of native heart without angina pectoris - Lipid Panel  4. Essential hypertension Under excellent control.  No changes made. - CMP14+EGFR  5. Vitamin D deficiency - VITAMIN D 25 Hydroxy (Vit-D Deficiency, Fractures)  6. Screening for prostate cancer  7. Need for vaccination - Pneumococcal conjugate vaccine  13-valent  8. Chronic obstructive pulmonary disease, unspecified COPD type ([place]) Pneumococcal vaccine updated.  He was given a sample of Anoro as well while he waits on his mail order to arrive.    Janora Norlander, DO [place] [phone]

## 2018-08-29 LAB — CMP14+EGFR
A/G RATIO: 1.6 (ref 1.2–2.2)
ALBUMIN: 4.1 g/dL (ref 3.5–4.8)
ALT: 13 IU/L (ref 0–44)
AST: 17 IU/L (ref 0–40)
Alkaline Phosphatase: 64 IU/L (ref 39–117)
BUN / CREAT RATIO: 15 (ref 10–24)
BUN: 18 mg/dL (ref 8–27)
Bilirubin Total: 0.5 mg/dL (ref 0.0–1.2)
CALCIUM: 8.8 mg/dL (ref 8.6–10.2)
CHLORIDE: 97 mmol/L (ref 96–106)
CO2: 25 mmol/L (ref 20–29)
Creatinine, Ser: 1.24 mg/dL (ref 0.76–1.27)
GFR, EST AFRICAN AMERICAN: 64 mL/min/{1.73_m2} (ref >59)
GFR, EST NON AFRICAN AMERICAN: 55 mL/min/{1.73_m2} — AB (ref >59)
GLUCOSE: 74 mg/dL (ref 65–99)
Globulin, Total: 2.5 g/dL (ref 1.5–4.5)
Potassium: 3.8 mmol/L (ref 3.5–5.2)
Sodium: 142 mmol/L (ref 134–144)
TOTAL PROTEIN: 6.6 g/dL (ref 6.0–8.5)

## 2018-08-29 LAB — LIPID PANEL
Chol/HDL Ratio: 3.4 ratio (ref 0.0–5.0)
Cholesterol, Total: 98 mg/dL — ABNORMAL LOW (ref 100–199)
HDL: 29 mg/dL — ABNORMAL LOW (ref >39)
LDL CALC: 48 mg/dL (ref 0–99)
Triglycerides: 103 mg/dL (ref 0–149)
VLDL Cholesterol Cal: 21 mg/dL (ref 5–40)

## 2018-08-29 LAB — VITAMIN D 25 HYDROXY (VIT D DEFICIENCY, FRACTURES): VIT D 25 HYDROXY: 37 ng/mL (ref 30.0–100.0)

## 2018-08-29 LAB — PSA: PROSTATE SPECIFIC AG, SERUM: 0.4 ng/mL (ref 0.0–4.0)

## 2018-09-01 ENCOUNTER — Other Ambulatory Visit: Payer: Self-pay | Admitting: *Deleted

## 2018-09-01 MED ORDER — UMECLIDINIUM-VILANTEROL 62.5-25 MCG/INH IN AEPB: 1.0000 | INHALATION_SPRAY | Freq: Every day | RESPIRATORY_TRACT | 0 refills | Status: DC

## 2018-09-14 DIAGNOSIS — N200 Calculus of kidney: Secondary | ICD-10-CM | POA: Diagnosis not present

## 2018-09-14 DIAGNOSIS — R3912 Poor urinary stream: Secondary | ICD-10-CM | POA: Diagnosis not present

## 2018-09-14 DIAGNOSIS — N401 Enlarged prostate with lower urinary tract symptoms: Secondary | ICD-10-CM | POA: Diagnosis not present

## 2018-11-04 ENCOUNTER — Other Ambulatory Visit: Payer: Self-pay | Admitting: *Deleted

## 2018-11-04 MED ORDER — UMECLIDINIUM-VILANTEROL 62.5-25 MCG/INH IN AEPB: 1.0000 | INHALATION_SPRAY | Freq: Every day | RESPIRATORY_TRACT | 0 refills | Status: DC

## 2019-01-06 ENCOUNTER — Telehealth: Payer: Self-pay | Admitting: *Deleted

## 2019-01-06 NOTE — Telephone Encounter (Signed)
   Primary Cardiologist:  Rozann Lesches, MD   Patient contacted.  History reviewed.  No symptoms to suggest any unstable cardiac conditions. Denied symptoms of chest pain, dizziness, swelling, weight gain, palpitations. Based on discussion, with current pandemic situation, patient chose to postpone this appointment. Appointment rescheduled per patient request 03/24/2019 or sooner if feasible/necessary.  If symptoms change, he has been instructed to contact our office.    Marlou Sa, RN  01/06/2019 9:13 AM         .

## 2019-01-08 ENCOUNTER — Ambulatory Visit: Payer: Medicare HMO | Admitting: Cardiology

## 2019-01-13 ENCOUNTER — Other Ambulatory Visit: Payer: Self-pay | Admitting: Cardiology

## 2019-01-13 ENCOUNTER — Other Ambulatory Visit: Payer: Self-pay | Admitting: Family Medicine

## 2019-01-13 DIAGNOSIS — E782 Mixed hyperlipidemia: Secondary | ICD-10-CM

## 2019-01-14 MED ORDER — UMECLIDINIUM-VILANTEROL 62.5-25 MCG/INH IN AEPB: 1.0000 | INHALATION_SPRAY | Freq: Every day | RESPIRATORY_TRACT | 0 refills | Status: DC

## 2019-01-25 ENCOUNTER — Other Ambulatory Visit: Payer: Self-pay | Admitting: *Deleted

## 2019-01-25 MED ORDER — UMECLIDINIUM-VILANTEROL 62.5-25 MCG/INH IN AEPB
1.0000 | INHALATION_SPRAY | Freq: Every day | RESPIRATORY_TRACT | 1 refills | Status: DC
Start: 2019-01-25 — End: 2019-02-26

## 2019-02-26 ENCOUNTER — Other Ambulatory Visit: Payer: Self-pay

## 2019-02-26 ENCOUNTER — Ambulatory Visit (INDEPENDENT_AMBULATORY_CARE_PROVIDER_SITE_OTHER): Payer: Medicare HMO | Admitting: Family Medicine

## 2019-02-26 ENCOUNTER — Encounter: Payer: Self-pay | Admitting: Family Medicine

## 2019-02-26 VITALS — BP 122/75 | Temp 97.8°F

## 2019-02-26 DIAGNOSIS — I25118 Atherosclerotic heart disease of native coronary artery with other forms of angina pectoris: Secondary | ICD-10-CM | POA: Diagnosis not present

## 2019-02-26 DIAGNOSIS — I10 Essential (primary) hypertension: Secondary | ICD-10-CM

## 2019-02-26 DIAGNOSIS — M170 Bilateral primary osteoarthritis of knee: Secondary | ICD-10-CM | POA: Diagnosis not present

## 2019-02-26 DIAGNOSIS — J449 Chronic obstructive pulmonary disease, unspecified: Secondary | ICD-10-CM

## 2019-02-26 DIAGNOSIS — E782 Mixed hyperlipidemia: Secondary | ICD-10-CM | POA: Diagnosis not present

## 2019-02-26 MED ORDER — DICLOFENAC SODIUM 1 % TD GEL
4.0000 g | Freq: Four times a day (QID) | TRANSDERMAL | 1 refills | Status: DC
Start: 2019-02-26 — End: 2019-05-27

## 2019-02-26 MED ORDER — CARVEDILOL 6.25 MG PO TABS: 6.2500 mg | ORAL_TABLET | Freq: Two times a day (BID) | ORAL | 4 refills | Status: DC

## 2019-02-26 MED ORDER — ROSUVASTATIN CALCIUM 20 MG PO TABS: 20.0000 mg | ORAL_TABLET | Freq: Every day | ORAL | 3 refills | Status: DC

## 2019-02-26 MED ORDER — ANORO ELLIPTA 62.5-25 MCG/INH IN AEPB: 1.0000 | INHALATION_SPRAY | Freq: Every day | RESPIRATORY_TRACT | 1 refills | Status: DC

## 2019-02-26 NOTE — Progress Notes (Signed)
Telephone visit  Subjective: CC: Follow-up hypertension, hyperlipidemia, CAD PCP: Janora Norlander, DO Hector Torres is a 78 y.o. male calls for telephone consult today. Patient provides verbal consent for consult held via phone.  Location of patient: Home Location of provider: [facility] Others present for call: None  1.  Hypertension with hyperlipidemia and known CAD Patient reports compliance with antihypertensives and cholesterol medication.  He denies any shortness of breath, lower extremity edema, dizziness or falls.  He does report chronic intermittent chest pain.  He notes that this is been evaluated multiple times but no significant finding since his initial catheterization.  2.  Arthritis Patient reports longstanding history of bilateral knee arthritis.  He has had a couple of corticosteroid injections and gone through [place] with orthopedics.  Thus far nothing seems to be really helping and pain seems to be ongoing.  Pain is exacerbated by getting up from a seated position but often gets better after he moves around for a while.  No joint swelling.   ROS: Per HPI  Allergies  Allergen Reactions  . Norvasc [Amlodipine Besylate] Swelling and Other (See Comments)    LE swelling   Past Medical History:  Diagnosis Date  . Arthritis   . BPH (benign prostatic hypertrophy)   . Carotid artery disease (HCC)    Bilateral - Dr. Bridgett Larsson  . Colon polyp   . COPD (chronic obstructive pulmonary disease) ([place])   . Coronary atherosclerosis of native coronary artery    PTCA circumflex at [facility], nonobstructive residual disease at catheterization June and October 2016  . Essential hypertension, benign    Possible white coat  . GERD (gastroesophageal reflux disease)   . History of nephrolithiasis   . Mixed hyperlipidemia   . Prostate nodule   . PVD (peripheral vascular disease) ([place])   . TIA (transient ischemic attack)   . Vitamin D deficiency     Current  Outpatient Medications:  .  aspirin 81 MG tablet, Take 81 mg by mouth daily., Disp: , Rfl:  .  carvedilol (COREG) 6.25 MG tablet, Take 1 tablet (6.25 mg total) by mouth 2 (two) times daily with a meal., Disp: 180 tablet, Rfl: 4 .  diphenhydramine-acetaminophen (TYLENOL PM) 25-500 MG TABS tablet, Take 2 tablets by mouth at bedtime as needed (sleep/pain)., Disp: , Rfl:  .  hydrochlorothiazide (MICROZIDE) 12.5 MG capsule, TAKE 1 CAPSULE EVERY DAY, Disp: 90 capsule, Rfl: 1 .  irbesartan (AVAPRO) 75 MG tablet, TAKE 1 TABLET EVERY DAY, Disp: 90 tablet, Rfl: 1 .  isosorbide mononitrate (IMDUR) 30 MG 24 hr tablet, TAKE 1 TABLET EVERY DAY, Disp: 90 tablet, Rfl: 1 .  Multiple Vitamin (MULTIVITAMIN PO), Take 1 tablet by mouth daily.  , Disp: , Rfl:  .  rosuvastatin (CRESTOR) 20 MG tablet, Take 1 tablet (20 mg total) by mouth daily., Disp: 90 tablet, Rfl: 3 .  umeclidinium-vilanterol (ANORO ELLIPTA) 62.5-25 MCG/INH AEPB, Inhale 1 puff into the lungs daily., Disp: 180 each, Rfl: 1   Blood pressure 122/75, temperature 97.8 F (36.6 C). Gen: does not sound distressed Pulm: no dyspnea with speech  Assessment/ Plan: 78 y.o. male   1. Essential hypertension Controlled.  Continue current regimen.  Coreg resent.  2. Mixed hyperlipidemia Continue statin.  Renewed Crestor - rosuvastatin (CRESTOR) 20 MG tablet; Take 1 tablet (20 mg total) by mouth daily.  Dispense: 90 tablet; Refill: 3  3. Atherosclerosis of native coronary artery of native heart with stable angina pectoris ([place]) Continue blood pressure  and cholesterol control as above  4. Chronic obstructive pulmonary disease, unspecified COPD type ([place]) I have given him a 66-month supply with several refills of his inhaler.  COPD is stable.  5. Primary osteoarthritis of both knees Not controlled despite corticosteroid injections and Visco supplementation.  Given cardiac disease I advised against use of oral NSAIDs.  Instead I will give him diclofenac gel  to apply to the affected areas up to 4 times daily if needed.  He will follow-up PRN on this issue - diclofenac sodium (VOLTAREN) 1 % GEL; Apply 4 g topically 4 (four) times daily.  Dispense: 400 g; Refill: 1   Start time: 1:03pm End time: 1:11pm  Total time spent on patient care (including telephone call/ virtual visit): 18 minutes  [place], [place] [phone]

## 2019-03-16 ENCOUNTER — Telehealth: Payer: Self-pay | Admitting: Family Medicine

## 2019-03-16 NOTE — Chronic Care Management (AMB) (Signed)
Chronic Care Management   Note  03/16/2019 Name: Hector Torres MRN: [id number] DOB: 09/27/1940  Hector Torres is a 78 y.o. year old male who is a primary care patient of Janora Norlander, DO. I reached out to Jolee Ewing by phone today in response to a referral sent by Hector Torres health plan.    [name] was given information about Chronic Care Management services today including:  1. CCM service includes personalized support from designated clinical staff supervised by his physician, including individualized plan of care and coordination with other care providers 2. 24/7 contact phone numbers for assistance for urgent and routine care needs. 3. Service will only be billed when office clinical staff spend 20 minutes or more in a month to coordinate care. 4. Only one practitioner may furnish and bill the service in a calendar month. 5. The patient may stop CCM services at any time (effective at the end of the month) by phone call to the office staff. 6. The patient will be responsible for cost sharing (co-pay) of up to 20% of the service fee (after annual deductible is met).  Patient did not agree to enrollment in care management services and does not wish to consider at this time.  Follow up plan: The patient has been provided with contact information for the chronic care management team and has been advised to call with any health related questions or concerns.   [place]  [email or web address]   ??[phone]

## 2019-03-22 ENCOUNTER — Other Ambulatory Visit: Payer: Self-pay | Admitting: *Deleted

## 2019-03-22 DIAGNOSIS — I6523 Occlusion and stenosis of bilateral carotid arteries: Secondary | ICD-10-CM

## 2019-03-24 ENCOUNTER — Ambulatory Visit: Payer: Medicare HMO | Admitting: Cardiology

## 2019-04-30 ENCOUNTER — Other Ambulatory Visit: Payer: Self-pay | Admitting: *Deleted

## 2019-04-30 DIAGNOSIS — I6523 Occlusion and stenosis of bilateral carotid arteries: Secondary | ICD-10-CM

## 2019-05-04 ENCOUNTER — Ambulatory Visit: Payer: Medicare HMO | Admitting: Cardiology

## 2019-05-13 ENCOUNTER — Ambulatory Visit (INDEPENDENT_AMBULATORY_CARE_PROVIDER_SITE_OTHER): Payer: Medicare HMO

## 2019-05-13 ENCOUNTER — Other Ambulatory Visit: Payer: Self-pay

## 2019-05-13 ENCOUNTER — Other Ambulatory Visit: Payer: Self-pay | Admitting: Cardiology

## 2019-05-13 DIAGNOSIS — I6523 Occlusion and stenosis of bilateral carotid arteries: Secondary | ICD-10-CM | POA: Diagnosis not present

## 2019-05-17 ENCOUNTER — Telehealth: Payer: Self-pay | Admitting: *Deleted

## 2019-05-17 DIAGNOSIS — I6523 Occlusion and stenosis of bilateral carotid arteries: Secondary | ICD-10-CM

## 2019-05-17 NOTE — Telephone Encounter (Signed)
Patient informed. Copy sent to PCP °

## 2019-05-17 NOTE — Telephone Encounter (Signed)
-----   Message from Satira Sark, MD sent at 05/13/2019  6:40 PM EDT ----- Results reviewed. Moderate bilateral ICA stenoses. Can get a follow-up study in one year.

## 2019-05-27 ENCOUNTER — Encounter: Payer: Self-pay | Admitting: *Deleted

## 2019-05-27 ENCOUNTER — Encounter: Payer: Self-pay | Admitting: Cardiology

## 2019-05-27 ENCOUNTER — Other Ambulatory Visit: Payer: Self-pay

## 2019-05-27 ENCOUNTER — Telehealth: Payer: Self-pay | Admitting: Cardiology

## 2019-05-27 ENCOUNTER — Ambulatory Visit (INDEPENDENT_AMBULATORY_CARE_PROVIDER_SITE_OTHER): Payer: Medicare HMO | Admitting: Cardiology

## 2019-05-27 VITALS — BP 198/82 | HR 67 | Ht 70.0 in | Wt 202.6 lb

## 2019-05-27 DIAGNOSIS — I25118 Atherosclerotic heart disease of native coronary artery with other forms of angina pectoris: Secondary | ICD-10-CM

## 2019-05-27 DIAGNOSIS — I25119 Atherosclerotic heart disease of native coronary artery with unspecified angina pectoris: Secondary | ICD-10-CM | POA: Diagnosis not present

## 2019-05-27 DIAGNOSIS — I6523 Occlusion and stenosis of bilateral carotid arteries: Secondary | ICD-10-CM | POA: Diagnosis not present

## 2019-05-27 DIAGNOSIS — E782 Mixed hyperlipidemia: Secondary | ICD-10-CM | POA: Diagnosis not present

## 2019-05-27 DIAGNOSIS — I10 Essential (primary) hypertension: Secondary | ICD-10-CM | POA: Diagnosis not present

## 2019-05-27 MED ORDER — ISOSORBIDE MONONITRATE ER 30 MG PO TB24: 30.0000 mg | ORAL_TABLET | Freq: Two times a day (BID) | ORAL | 1 refills | Status: DC

## 2019-05-27 NOTE — Telephone Encounter (Signed)
Pre-cert Verification for the following procedure    Lexiscan scheduled for 06-08-2019 at [facility]

## 2019-05-27 NOTE — Progress Notes (Signed)
Cardiology Office Note  Date: 05/27/2019   ID: Hector, Torres 08-26-1940, MRN [id number]  PCP:  Janora Norlander, DO  Cardiologist:  Rozann Lesches, MD Electrophysiologist:  None   Chief Complaint  Patient presents with  . Cardiac follow-up    History of Present Illness: Hector Torres is a 78 y.o. male last seen in May 2019.  He presents for a follow-up visit.  We went over his home blood pressure recordings which have been fluctuating a fair bit.  In general his blood pressure is controlled but he does have some outliers as noted today in the office.  He reports compliance with his medications, listed below.  He reports intermittent chest pain symptoms suggestive of angina.  These tend to be sporadic more so than associated with exertion.  He still is able to play golf and remains functional with ADLs, does not describe any definite change in stamina.  I personally reviewed his ECG today which shows sinus rhythm with diffuse ST-T wave abnormalities and decreased R wave progression.  We went over his medications today and discussed options.  His last cardiac catheterization in 2016 showed nonobstructive disease.  He has a previous history of angioplasty to the circumflex at [facility] in 1995.  Past Medical History:  Diagnosis Date  . Arthritis   . BPH (benign prostatic hypertrophy)   . Carotid artery disease (HCC)    Bilateral - Dr. Bridgett Larsson  . Colon polyp   . COPD (chronic obstructive pulmonary disease) ([place])   . Coronary atherosclerosis of native coronary artery    PTCA circumflex at [facility], nonobstructive residual disease at catheterization June and October 2016  . Essential hypertension, benign    Possible white coat  . GERD (gastroesophageal reflux disease)   . History of nephrolithiasis   . Mixed hyperlipidemia   . Prostate nodule   . PVD (peripheral vascular disease) ([place])   . TIA (transient ischemic attack)   . Vitamin D deficiency     Past Surgical  History:  Procedure Laterality Date  . ANGIOPLASTY Left 1995   Cir. Artery  . APPENDECTOMY  1947  . CARDIAC CATHETERIZATION N/A 02/03/2015   Procedure: Left Heart Cath and Cors/Grafts Angiography;  Surgeon: Sherren Mocha, MD;  Location: [place] CV [facility];  Service: Cardiovascular;  Laterality: N/A;  . CARDIAC CATHETERIZATION N/A 05/19/2015   Procedure: Left Heart Cath and Coronary Angiography;  Surgeon: Troy Sine, MD;  Location: [place] CV [facility];  Service: Cardiovascular;  Laterality: N/A;  . COLONOSCOPY N/A 03/19/2018   Procedure: COLONOSCOPY;  Surgeon: Danie Binder, MD;  [facility];  Service: Endoscopy;  Laterality: N/A;  12:00  . EYE SURGERY     Bilateral cataract  . POLYPECTOMY  03/19/2018   Procedure: POLYPECTOMY;  Surgeon: Danie Binder, MD;  Location: [facility];  Service: Endoscopy;;  . TONSILLECTOMY  1968  . Traumatic injury  1962   Lost tip of index, middle, and ring finger/ right hand    Current Outpatient Medications  Medication Sig Dispense Refill  . aspirin 81 MG tablet Take 81 mg by mouth daily.    . carvedilol (COREG) 6.25 MG tablet Take 1 tablet (6.25 mg total) by mouth 2 (two) times daily with a meal. 180 tablet 4  . diphenhydramine-acetaminophen (TYLENOL PM) 25-500 MG TABS tablet Take 2 tablets by mouth at bedtime as needed (sleep/pain).    . hydrochlorothiazide (MICROZIDE) 12.5 MG capsule TAKE 1 CAPSULE EVERY DAY 90  capsule 1  . irbesartan (AVAPRO) 75 MG tablet TAKE 1 TABLET EVERY DAY 90 tablet 1  . isosorbide mononitrate (IMDUR) 30 MG 24 hr tablet Take 1 tablet (30 mg total) by mouth 2 (two) times daily. 180 tablet 1  . Multiple Vitamin (MULTIVITAMIN PO) Take 1 tablet by mouth daily.      . rosuvastatin (CRESTOR) 20 MG tablet Take 1 tablet (20 mg total) by mouth daily. 90 tablet 3  . umeclidinium-vilanterol (ANORO ELLIPTA) 62.5-25 MCG/INH AEPB Inhale 1 puff into the lungs daily. 180 each 1   No current facility-administered medications for  this visit.    Allergies:  Norvasc [amlodipine besylate]   Social History: The patient  reports that he quit smoking about 8 years ago. His smoking use included cigarettes. He has a 90.00 pack-year smoking history. He has never used smokeless tobacco. He reports that he does not drink alcohol or use drugs.   ROS:  Please see the history of present illness. Otherwise, complete review of systems is positive for none.  All other systems are reviewed and negative.   Physical Exam: VS:  BP (!) 198/82   Pulse 67   Ht 5\' 10"  (1.778 m)   Wt 202 lb 9.6 oz (91.9 kg)   SpO2 96%   BMI 29.07 kg/m , BMI Body mass index is 29.07 kg/m.  Wt Readings from Last 3 Encounters:  05/27/19 202 lb 9.6 oz (91.9 kg)  08/28/18 202 lb (91.6 kg)  03/19/18 210 lb (95.3 kg)    General: Patient appears comfortable at rest. HEENT: Conjunctiva and lids normal, wearing a mask. Neck: Supple, no elevated JVP or carotid bruits, no thyromegaly. Lungs: Clear to auscultation, nonlabored breathing at rest. Cardiac: Regular rate and rhythm, no S3, 2/6 systolic murmur. Abdomen: Soft, nontender, bowel sounds present. Extremities: No pitting edema, distal pulses 2+. Skin: Warm and dry. Musculoskeletal: No kyphosis. Neuropsychiatric: Alert and oriented x3, affect grossly appropriate.  ECG:  An ECG dated 01/05/2018 was personally reviewed today and demonstrated:  Sinus rhythm with nonspecific ST-T changes.  Recent Labwork: 08/28/2018: ALT 13; AST 17; BUN 18; Creatinine, Ser 1.24; Potassium 3.8; Sodium 142     Component Value Date/Time   CHOL 98 (L) 08/28/2018 1332   CHOL 102 12/25/2012 1251   TRIG 103 08/28/2018 1332   TRIG 82 12/25/2012 1251   HDL 29 (L) 08/28/2018 1332   HDL 36 (L) 12/25/2012 1251   CHOLHDL 3.4 08/28/2018 1332   LDLCALC 48 08/28/2018 1332   LDLCALC 50 12/25/2012 1251    Other Studies Reviewed Today:  Carotid Dopplers 05/13/2019: Summary: Right Carotid: Velocities in the right ICA are consistent  with a 40-59%                stenosis.  Left Carotid: Velocities in the left ICA are consistent with a 40-59% stenosis.               Non-hemodynamically significant plaque <50% noted in the CCA.  Vertebrals:  Right vertebral artery demonstrates antegrade flow. Left vertebral              artery demonstrates bidirectional flow. Subclavians: Left subclavian artery was stenotic. Normal flow hemodynamics were              seen in the right subclavian artery.  Cardiac catheterization 05/19/2015:  1st Diag lesion, 50% stenosed.  Prox LAD lesion, 20% stenosed.  Prox Cx lesion, 30% stenosed.  Mid Cx lesion, 20% stenosed.  Dist Cx lesion, 20%  stenosed.  The left ventricular systolic function is normal.  Hyperdynamic LV function with an ejection fraction of 70-75% with evidence for left ventricular hypertrophy without focal segmental wall motion abnormalities.  No significant coronary obstructive disease with mild 20% luminal narrowing in the proximal LAD, 50% ostial stenosis in a diminutive first diagonal branch; scattered smooth 30% proximal, 20% percent mid, and 20% distal left circumflex narrowing in a dominant left circumflex vessel and a small nondominant RCA.  Assessment and Plan:  1.  CAD with history of circumflex angioplasty 1995.  Last cardiac catheterization in 2016 showed mild to moderate diffuse nonobstructive disease.  He reports angina symptoms, some with atypical features on medical therapy.  ECG reviewed.  We have discussed options and at this point plan will be to increase Imdur to 30 mg twice daily and follow-up with a Lexiscan Myoview to assess ischemic burden.  2.  Carotid artery disease, follow-up Dopplers done recently showed stable moderate disease.  Continue aspirin and statin.  3.  Essential hypertension, blood pressure fluctuating on current regimen.  Imdur is being increased as noted above.  4.  Mixed hyperlipidemia, continues on Crestor.  Most recent LDL  48.  Medication Adjustments/Labs and Tests Ordered: Current medicines are reviewed at length with the patient today.  Concerns regarding medicines are outlined above.   Tests Ordered: Orders Placed This Encounter  Procedures  . NM Myocar Multi W/Spect W/Wall Motion / EF  . EKG 12-Lead    Medication Changes: Meds ordered this encounter  Medications  . isosorbide mononitrate (IMDUR) 30 MG 24 hr tablet    Sig: Take 1 tablet (30 mg total) by mouth 2 (two) times daily.    Dispense:  180 tablet    Refill:  1    05/27/2019 dose increase    Disposition:  Follow up 6 weeks in the [place] office.  Signed, Satira Sark, MD, [facility] 05/27/2019 2:20 PM    [facility] [place], [place], [place] [address] Phone: [phone]; Fax: [phone]

## 2019-05-27 NOTE — Patient Instructions (Addendum)
Medication Instructions:    Your physician has recommended you make the following change in your medication  Increase isosorbide mononitrate to 30 mg by mouth twice daily  Continue all other medications the same  Labwork:  NONE  Testing/Procedures: Your physician has requested that you have a lexiscan myoview. For further information please visit [email or web address]. Please follow instruction sheet, as given.  Follow-Up:  Your physician recommends that you schedule a follow-up appointment in: 6 weeks.  Any Other Special Instructions Will Be Listed Below (If Applicable).  If you need a refill on your cardiac medications before your next appointment, please call your pharmacy.

## 2019-06-08 ENCOUNTER — Other Ambulatory Visit (HOSPITAL_COMMUNITY): Payer: Medicare HMO

## 2019-06-08 ENCOUNTER — Encounter (HOSPITAL_COMMUNITY): Payer: Medicare HMO

## 2019-06-28 ENCOUNTER — Telehealth: Payer: Self-pay | Admitting: *Deleted

## 2019-06-28 NOTE — Telephone Encounter (Signed)
Contacted office to report symptoms of chest pain has improved since increasing imdur to 30 mg BID and he canceled stress test for this reason as well. Patient agrees to having a 6 month recall placed for follow up.

## 2019-07-08 ENCOUNTER — Ambulatory Visit: Payer: Medicare HMO | Admitting: Cardiology

## 2019-08-23 ENCOUNTER — Other Ambulatory Visit: Payer: Self-pay | Admitting: Cardiology

## 2019-08-23 ENCOUNTER — Telehealth: Payer: Self-pay | Admitting: Family Medicine

## 2019-08-23 ENCOUNTER — Other Ambulatory Visit: Payer: Self-pay | Admitting: Family Medicine

## 2019-08-23 MED ORDER — ANORO ELLIPTA 62.5-25 MCG/INH IN AEPB: 1.0000 | INHALATION_SPRAY | Freq: Every day | RESPIRATORY_TRACT | 1 refills | Status: DC

## 2019-08-23 NOTE — Telephone Encounter (Signed)
What is the name of the medication? umeclidinium-vilanterol (Hector Torres) 62.5-25 MCG/INH AEPBumeclidinium-vilanterol (Hector Torres) 62.5-25 MCG/INH AEPBumeclidinium-vilanterol (Hector Torres) 62.5-25 MCG/INH AEPB/  Pt said the pharmacy is trying to charge him $1300 for a 90 day supply. He wants to know if we have samples  Have you contacted your pharmacy to request a refill? yes  Which pharmacy would you like this sent to? [facility]   Patient notified that their request is being sent to the clinical staff for review and that they should receive a call once it is complete. If they do not receive a call within 24 hours they can check with their pharmacy or our office.

## 2019-08-23 NOTE — Telephone Encounter (Signed)
Pt wants to Rx to be called [facility] to [facility] in [place] for inhaler.

## 2019-08-23 NOTE — Telephone Encounter (Signed)
[facility] and re send to [facility] - patient aware.

## 2019-08-23 NOTE — Telephone Encounter (Signed)
Spoke to pt and he just wanted [place] to send his rx for Anoro to his local pharmacy so he could get it today. [facility] [place] [facility] pt.

## 2019-08-23 NOTE — Telephone Encounter (Signed)
Advised pt we don't have any samples. He says he is calling [employer] now to speak with them about this and will call us back to let us know if we need to change the medication or what he would like [place] to do.

## 2019-09-08 ENCOUNTER — Telehealth: Payer: Self-pay | Admitting: *Deleted

## 2019-09-08 ENCOUNTER — Other Ambulatory Visit: Payer: Self-pay | Admitting: Family Medicine

## 2019-09-08 ENCOUNTER — Telehealth: Payer: Self-pay | Admitting: Family Medicine

## 2019-09-08 MED ORDER — BREO ELLIPTA 100-25 MCG/INH IN AEPB: 1.0000 | INHALATION_SPRAY | Freq: Every day | RESPIRATORY_TRACT | 12 refills | Status: DC

## 2019-09-08 NOTE — Telephone Encounter (Signed)
Anoro Ellipta 62.5-25mcg inh-Non-Formulary on pt's insurance  No alternatives were given

## 2019-09-08 NOTE — Telephone Encounter (Signed)
I have sent in Breo 100 instead.  If this is not covered by his insurance, please have him contact his insurance company to see which medications are on his formulary so that we can make a proper selection for him.

## 2019-09-08 NOTE — Telephone Encounter (Signed)
Appt given per patients request 

## 2019-09-09 ENCOUNTER — Other Ambulatory Visit: Payer: Self-pay

## 2019-09-09 NOTE — Telephone Encounter (Signed)
Aware of medication change.

## 2019-09-10 ENCOUNTER — Encounter: Payer: Self-pay | Admitting: Family Medicine

## 2019-09-10 ENCOUNTER — Ambulatory Visit (INDEPENDENT_AMBULATORY_CARE_PROVIDER_SITE_OTHER): Payer: Medicare HMO | Admitting: Family Medicine

## 2019-09-10 VITALS — BP 126/68 | HR 95 | Temp 96.9°F | Ht 70.0 in | Wt 203.0 lb

## 2019-09-10 DIAGNOSIS — H6121 Impacted cerumen, right ear: Secondary | ICD-10-CM

## 2019-09-10 DIAGNOSIS — I25118 Atherosclerotic heart disease of native coronary artery with other forms of angina pectoris: Secondary | ICD-10-CM | POA: Diagnosis not present

## 2019-09-10 DIAGNOSIS — E782 Mixed hyperlipidemia: Secondary | ICD-10-CM

## 2019-09-10 DIAGNOSIS — N400 Enlarged prostate without lower urinary tract symptoms: Secondary | ICD-10-CM

## 2019-09-10 DIAGNOSIS — I10 Essential (primary) hypertension: Secondary | ICD-10-CM | POA: Diagnosis not present

## 2019-09-10 DIAGNOSIS — N3000 Acute cystitis without hematuria: Secondary | ICD-10-CM | POA: Diagnosis not present

## 2019-09-10 DIAGNOSIS — R300 Dysuria: Secondary | ICD-10-CM | POA: Diagnosis not present

## 2019-09-10 LAB — MICROSCOPIC EXAMINATION
Epithelial Cells (non renal): NONE SEEN /hpf (ref 0–10)
RBC, Urine: NONE SEEN /hpf (ref 0–2)
Renal Epithel, UA: NONE SEEN /hpf

## 2019-09-10 LAB — URINALYSIS, COMPLETE
Bilirubin, UA: NEGATIVE
Ketones, UA: NEGATIVE
Nitrite, UA: NEGATIVE
Protein,UA: NEGATIVE
RBC, UA: NEGATIVE
Specific Gravity, UA: 1.025 (ref 1.005–1.030)
Urobilinogen, Ur: 0.2 mg/dL (ref 0.2–1.0)
pH, UA: 5.5 (ref 5.0–7.5)

## 2019-09-10 MED ORDER — CEPHALEXIN 500 MG PO CAPS: 500.0000 mg | ORAL_CAPSULE | Freq: Two times a day (BID) | ORAL | 0 refills | Status: AC

## 2019-09-10 NOTE — Progress Notes (Signed)
Subjective: CC: 73-monthfollow-up for blood pressure, lipid and COPD PCP: Hector Norlander DO HXBO:[id number] [name] a 79y.o. male presenting to clinic today for:  1.  Hypertension with hyperlipidemia/CAD Patient reports compliance with Coreg 6.25 mg twice daily, hydrochlorothiazide 12.5 mg daily and Azor pro 75 mg daily.  He is also on isosorbide 30 mg daily along with his Crestor 20 mg daily.  No chest pain, shortness of breath (outside of baseline), visual disturbance, dizziness or lower extremity edema.  No change in activity tolerance.  2.  COPD Patient's Anoro is no longer covered by his insurance.  [facility] [place] [place] [facility] but apparently is covered at a $45 co-pay.  This is what the Anoro cost as well he does note that he was spacing out the Norco to every other day due to being in the donut hole.  He does not feel that his breathing has been worsened by doing this.  Denies any wheezing.  3.  BPH Patient has an appointment with his urologist next month.  He would like to go ahead and get his PSA checked in anticipation of that appointment.  He does report dysuria with dark urine and and urinary odor is been ongoing for about 1 week.  Denies any hematuria, nausea, vomiting, abdominal pain, increased urinary frequency or urgency.   ROS: Per HPI  Allergies  Allergen Reactions  . Norvasc [Amlodipine Besylate] Swelling and Other (See Comments)    LE swelling   Past Medical History:  Diagnosis Date  . Arthritis   . BPH (benign prostatic hypertrophy)   . Carotid artery disease (HCC)    Bilateral - Dr. CBridgett Larsson . Colon polyp   . COPD (chronic obstructive pulmonary disease) ([place]   . Coronary atherosclerosis of native coronary artery    [facility] and October 2016  . Essential hypertension, benign    Possible white coat  . GERD (gastroesophageal reflux disease)   .  History of nephrolithiasis   . Mixed hyperlipidemia   . Prostate nodule   . PVD (peripheral vascular disease) ([place]   . TIA (transient ischemic attack)   . Vitamin D deficiency     Current Outpatient Medications:  .  aspirin 81 MG tablet, Take 81 mg by mouth daily., Disp: , Rfl:  .  carvedilol (COREG) 6.25 MG tablet, Take 1 tablet (6.25 mg total) by mouth 2 (two) times daily with a meal., Disp: 180 tablet, Rfl: 4 .  diphenhydramine-acetaminophen (TYLENOL PM) 25-500 MG TABS tablet, Take 2 tablets by mouth at bedtime as needed (sleep/pain)., Disp: , Rfl:  .  fluticasone furoate-vilanterol (BREO ELLIPTA) 100-25 MCG/INH AEPB, Inhale 1 puff into the lungs daily., Disp: 28 each, Rfl: 12 .  hydrochlorothiazide (MICROZIDE) 12.5 MG capsule, TAKE 1 CAPSULE EVERY DAY, Disp: 90 capsule, Rfl: 3 .  irbesartan (AVAPRO) 75 MG tablet, TAKE 1 TABLET EVERY DAY, Disp: 90 tablet, Rfl: 3 .  isosorbide mononitrate (IMDUR) 30 MG 24 hr tablet, Take 1 tablet (30 mg total) by mouth 2 (two) times daily., Disp: 180 tablet, Rfl: 1 .  Multiple Vitamin (MULTIVITAMIN PO), Take 1 tablet by mouth daily.  , Disp: , Rfl:  .  rosuvastatin (CRESTOR) 20 MG tablet, Take 1 tablet (20 mg total) by mouth daily., Disp: 90 tablet, Rfl: 3 .  umeclidinium-vilanterol (ANORO ELLIPTA) 62.5-25 MCG/INH AEPB, Inhale 1 puff into the lungs daily., Disp:  180 each, Rfl: 1 Social History   Socioeconomic History  . Marital status: Widowed    Spouse name: Not on file  . Number of children: Not on file  . Years of education: Not on file  . Highest education level: Not on file  Occupational History  . Not on file  Tobacco Use  . Smoking status: Former Smoker    Packs/day: 1.50    Years: 60.00    Pack years: 90.00    Types: Cigarettes    Quit date: 02/27/2011    Years since quitting: 8.5  . Smokeless tobacco: Never Used  Substance and Sexual Activity  . Alcohol use: No  . Drug use: No  . Sexual activity: Not on file  Other Topics Concern   . Not on file  Social History Narrative  . Not on file   Social Determinants of Health   Financial Resource Strain:   . Difficulty of Paying Living Expenses: Not on file  Food Insecurity:   . Worried About Charity fundraiser in the Last Year: Not on file  . Ran Out of Food in the Last Year: Not on file  Transportation Needs:   . Lack of Transportation (Medical): Not on file  . Lack of Transportation (Non-Medical): Not on file  Physical Activity:   . Days of Exercise per Week: Not on file  . Minutes of Exercise per Session: Not on file  Stress:   . Feeling of Stress : Not on file  Social Connections:   . Frequency of Communication with Friends and Family: Not on file  . Frequency of Social Gatherings with Friends and Family: Not on file  . Attends Religious Services: Not on file  . Active Member of Clubs or Organizations: Not on file  . Attends Archivist Meetings: Not on file  . Marital Status: Not on file  Intimate Partner Violence:   . Fear of Current or Ex-Partner: Not on file  . Emotionally Abused: Not on file  . Physically Abused: Not on file  . Sexually Abused: Not on file   Family History  Problem Relation Age of Onset  . Gastric cancer Mother   . Varicose Veins Mother   . Cancer Mother        Stomach  . Prostate cancer Father   . Cancer Father        Prostate  . Other Brother        carotid artery stenosis  . Diabetes Brother   . Leukemia Brother   . Hyperlipidemia Brother   . Hypertension Brother   . Colon cancer Brother   . Hyperlipidemia Brother   . Peripheral vascular disease Brother   . Cancer Brother        Colon and Prostate  . Hypertension Sister   . Hyperlipidemia Sister   . Diabetes Daughter   . Diabetes Son   . Prostate cancer Brother     Objective: Office vital signs reviewed. BP 126/68   Pulse 95   Temp (!) 96.9 F (36.1 C) (Temporal)   Ht '5\' 10"'  (1.778 m)   Wt 203 lb (92.1 kg)   SpO2 97%   BMI 29.13 kg/m    Physical Examination:  General: Awake, alert, well nourished, No acute distress HEENT: Normal. Sclera white, MMM; right TM occluded by cerumen. Cardio: distant heart sounds but seemingly regular rate and rhythm, S1S2 heard, no murmurs appreciated Pulm: Globally decreased breath sounds, particularly in the bases.  Normal work of  breathing on room air.  No wheezes, rhonchi or rales. Extremities: warm, well perfused, No edema, cyanosis or clubbing; +2 pulses bilaterally  No results found for this or any previous visit (from the past 24 hour(s)).  Assessment/ Plan: 79 y.o. male   1. Acute cystitis without hematuria Urinalysis shows trace leukocytes and trace glucose.  Urine microscopy with 6-10 white blood cells and moderate amounts of bacteria.  Will briefly treat for UTI given symptoms with Keflex twice daily for 7 days.  I have also sent this for culture and will contact patient with results once available.  Plan to CC results to his urologist as well -Urinalysis, Complete - CBC - cephALEXin (KEFLEX) 500 MG capsule; Take 1 capsule (500 mg total) by mouth 2 (two) times daily for 7 days.  Dispense: 14 capsule; Refill: 0 - Urine Culture  2. Essential hypertension Controlled.  Continue current regimen.  No refills needed - CMP14+EGFR  3. Mixed hyperlipidemia Check fasting lipid panel. - CMP14+EGFR - Lipid Panel  4. Atherosclerosis of native coronary artery of native heart with stable angina pectoris (HCC) Continue statin - CMP14+EGFR - Lipid Panel - TSH  5. Benign prostatic hyperplasia, unspecified whether lower urinary tract symptoms present Check PSA.  Will forward to Dr. Junious Silk, urology, once available - PSA  6. Impacted cerumen of right ear Irrigated.  Still scant cerumen noted but TM was visualized.   Orders Placed This Encounter  Procedures  . Urinalysis, Complete   No orders of the defined types were placed in this encounter.    Janora Norlander, DO [place] [phone]

## 2019-09-10 NOTE — Patient Instructions (Signed)
Urinary Tract Infection, Adult A urinary tract infection (UTI) is an infection of any part of the urinary tract. The urinary tract includes:  The kidneys.  The ureters.  The bladder.  The urethra. These organs make, store, and get rid of pee (urine) in the body. What are the causes? This is caused by germs (bacteria) in your genital area. These germs grow and cause swelling (inflammation) of your urinary tract. What increases the risk? You are more likely to develop this condition if:  You have a small, thin tube (catheter) to drain pee.  You cannot control when you pee or poop (incontinence).  You are male, and: ? You use these methods to prevent pregnancy:  A medicine that kills sperm (spermicide).  A device that blocks sperm (diaphragm). ? You have low levels of a male hormone (estrogen). ? You are pregnant.  You have genes that add to your risk.  You are sexually active.  You take antibiotic medicines.  You have trouble peeing because of: ? A prostate that is bigger than normal, if you are male. ? A blockage in the part of your body that drains pee from the bladder (urethra). ? A kidney stone. ? A nerve condition that affects your bladder (neurogenic bladder). ? Not getting enough to drink. ? Not peeing often enough.  You have other conditions, such as: ? Diabetes. ? A weak disease-fighting system (immune system). ? Sickle cell disease. ? Gout. ? Injury of the spine. What are the signs or symptoms? Symptoms of this condition include:  Needing to pee right away (urgently).  Peeing often.  Peeing small amounts often.  Pain or burning when peeing.  Blood in the pee.  Pee that smells bad or not like normal.  Trouble peeing.  Pee that is cloudy.  Fluid coming from the vagina, if you are male.  Pain in the belly or lower back. Other symptoms include:  Throwing up (vomiting).  No urge to eat.  Feeling mixed up (confused).  Being tired  and grouchy (irritable).  A fever.  Watery poop (diarrhea). How is this treated? This condition may be treated with:  Antibiotic medicine.  Other medicines.  Drinking enough water. Follow these instructions at home:  Medicines  Take over-the-counter and prescription medicines only as told by your doctor.  If you were prescribed an antibiotic medicine, take it as told by your doctor. Do not stop taking it even if you start to feel better. General instructions  Make sure you: ? Pee until your bladder is empty. ? Do not hold pee for a long time. ? Empty your bladder after sex. ? Wipe from front to back after pooping if you are a male. Use each tissue one time when you wipe.  Drink enough fluid to keep your pee pale yellow.  Keep all follow-up visits as told by your doctor. This is important. Contact a doctor if:  You do not get better after 1-2 days.  Your symptoms go away and then come back. Get help right away if:  You have very bad back pain.  You have very bad pain in your lower belly.  You have a fever.  You are sick to your stomach (nauseous).  You are throwing up. Summary  A urinary tract infection (UTI) is an infection of any part of the urinary tract.  This condition is caused by germs in your genital area.  There are many risk factors for a UTI. These include having a small, thin   tube to drain pee and not being able to control when you pee or poop.  Treatment includes antibiotic medicines for germs.  Drink enough fluid to keep your pee pale yellow. This information is not intended to replace advice given to you by your health care provider. Make sure you discuss any questions you have with your health care provider. Document Revised: 07/23/2018 Document Reviewed: 02/12/2018 Elsevier Patient Education  2020 Elsevier Inc.  

## 2019-09-11 LAB — CMP14+EGFR
ALT: 15 IU/L (ref 0–44)
AST: 19 IU/L (ref 0–40)
Albumin/Globulin Ratio: 1.7 (ref 1.2–2.2)
Albumin: 3.8 g/dL (ref 3.7–4.7)
Alkaline Phosphatase: 56 IU/L (ref 39–117)
BUN/Creatinine Ratio: 17 (ref 10–24)
BUN: 22 mg/dL (ref 8–27)
Bilirubin Total: 0.5 mg/dL (ref 0.0–1.2)
CO2: 27 mmol/L (ref 20–29)
Calcium: 9 mg/dL (ref 8.6–10.2)
Chloride: 104 mmol/L (ref 96–106)
Creatinine, Ser: 1.31 mg/dL — ABNORMAL HIGH (ref 0.76–1.27)
GFR calc Af Amer: 59 mL/min/{1.73_m2} — ABNORMAL LOW (ref >59)
GFR calc non Af Amer: 51 mL/min/{1.73_m2} — ABNORMAL LOW (ref >59)
Globulin, Total: 2.2 g/dL (ref 1.5–4.5)
Glucose: 90 mg/dL (ref 65–99)
Potassium: 4.1 mmol/L (ref 3.5–5.2)
Sodium: 143 mmol/L (ref 134–144)
Total Protein: 6 g/dL (ref 6.0–8.5)

## 2019-09-11 LAB — CBC
Hematocrit: 45.6 % (ref 37.5–51.0)
Hemoglobin: 16 g/dL (ref 13.0–17.7)
MCH: 34.2 pg — ABNORMAL HIGH (ref 26.6–33.0)
MCHC: 35.1 g/dL (ref 31.5–35.7)
MCV: 97 fL (ref 79–97)
Platelets: 209 10*3/uL (ref 150–450)
RBC: 4.68 x10E6/uL (ref 4.14–5.80)
RDW: 11.9 % (ref 11.6–15.4)
WBC: 6 10*3/uL (ref 3.4–10.8)

## 2019-09-11 LAB — LIPID PANEL
Chol/HDL Ratio: 2.9 ratio (ref 0.0–5.0)
Cholesterol, Total: 115 mg/dL (ref 100–199)
HDL: 39 mg/dL — ABNORMAL LOW (ref >39)
LDL Chol Calc (NIH): 57 mg/dL (ref 0–99)
Triglycerides: 104 mg/dL (ref 0–149)
VLDL Cholesterol Cal: 19 mg/dL (ref 5–40)

## 2019-09-11 LAB — TSH: TSH: 1.61 u[IU]/mL (ref 0.450–4.500)

## 2019-09-11 LAB — PSA: Prostate Specific Ag, Serum: 2.2 ng/mL (ref 0.0–4.0)

## 2019-09-12 LAB — URINE CULTURE

## 2019-09-20 DIAGNOSIS — N401 Enlarged prostate with lower urinary tract symptoms: Secondary | ICD-10-CM | POA: Diagnosis not present

## 2019-09-20 DIAGNOSIS — N200 Calculus of kidney: Secondary | ICD-10-CM | POA: Diagnosis not present

## 2019-09-20 DIAGNOSIS — R3121 Asymptomatic microscopic hematuria: Secondary | ICD-10-CM | POA: Diagnosis not present

## 2019-09-20 DIAGNOSIS — R3912 Poor urinary stream: Secondary | ICD-10-CM | POA: Diagnosis not present

## 2019-10-11 DIAGNOSIS — N200 Calculus of kidney: Secondary | ICD-10-CM | POA: Diagnosis not present

## 2019-10-11 DIAGNOSIS — R3121 Asymptomatic microscopic hematuria: Secondary | ICD-10-CM | POA: Diagnosis not present

## 2019-10-18 DIAGNOSIS — N401 Enlarged prostate with lower urinary tract symptoms: Secondary | ICD-10-CM | POA: Diagnosis not present

## 2019-10-18 DIAGNOSIS — R3914 Feeling of incomplete bladder emptying: Secondary | ICD-10-CM | POA: Diagnosis not present

## 2019-10-18 DIAGNOSIS — N200 Calculus of kidney: Secondary | ICD-10-CM | POA: Diagnosis not present

## 2019-10-18 DIAGNOSIS — R3912 Poor urinary stream: Secondary | ICD-10-CM | POA: Diagnosis not present

## 2019-10-18 DIAGNOSIS — R3121 Asymptomatic microscopic hematuria: Secondary | ICD-10-CM | POA: Diagnosis not present

## 2019-10-31 ENCOUNTER — Other Ambulatory Visit: Payer: Self-pay | Admitting: Cardiology

## 2020-03-09 ENCOUNTER — Ambulatory Visit (INDEPENDENT_AMBULATORY_CARE_PROVIDER_SITE_OTHER): Payer: Medicare HMO | Admitting: Family Medicine

## 2020-03-09 ENCOUNTER — Encounter: Payer: Self-pay | Admitting: Family Medicine

## 2020-03-09 ENCOUNTER — Other Ambulatory Visit: Payer: Self-pay

## 2020-03-09 VITALS — BP 172/94 | HR 56 | Temp 97.2°F | Ht 70.0 in | Wt 199.2 lb

## 2020-03-09 DIAGNOSIS — N400 Enlarged prostate without lower urinary tract symptoms: Secondary | ICD-10-CM

## 2020-03-09 DIAGNOSIS — L570 Actinic keratosis: Secondary | ICD-10-CM

## 2020-03-09 DIAGNOSIS — J449 Chronic obstructive pulmonary disease, unspecified: Secondary | ICD-10-CM | POA: Diagnosis not present

## 2020-03-09 DIAGNOSIS — E782 Mixed hyperlipidemia: Secondary | ICD-10-CM

## 2020-03-09 DIAGNOSIS — R300 Dysuria: Secondary | ICD-10-CM | POA: Diagnosis not present

## 2020-03-09 DIAGNOSIS — I10 Essential (primary) hypertension: Secondary | ICD-10-CM

## 2020-03-09 LAB — URINALYSIS, ROUTINE W REFLEX MICROSCOPIC
Bilirubin, UA: NEGATIVE
Glucose, UA: NEGATIVE
Ketones, UA: NEGATIVE
Leukocytes,UA: NEGATIVE
Nitrite, UA: NEGATIVE
Protein,UA: NEGATIVE
RBC, UA: NEGATIVE
Specific Gravity, UA: 1.025 (ref 1.005–1.030)
Urobilinogen, Ur: 0.2 mg/dL (ref 0.2–1.0)
pH, UA: 7 (ref 5.0–7.5)

## 2020-03-09 LAB — MICROSCOPIC EXAMINATION
Bacteria, UA: NONE SEEN
Epithelial Cells (non renal): NONE SEEN /hpf (ref 0–10)
RBC, Urine: NONE SEEN /hpf (ref 0–2)
Renal Epithel, UA: NONE SEEN /hpf
WBC, UA: NONE SEEN /hpf (ref 0–5)

## 2020-03-09 MED ORDER — IRBESARTAN 75 MG PO TABS: 112.5000 mg | ORAL_TABLET | Freq: Every day | ORAL | 3 refills | Status: DC

## 2020-03-09 MED ORDER — BREZTRI AEROSPHERE 160-9-4.8 MCG/ACT IN AERO: 2.0000 | INHALATION_SPRAY | Freq: Two times a day (BID) | RESPIRATORY_TRACT | 0 refills | Status: DC

## 2020-03-09 NOTE — Progress Notes (Signed)
Subjective: CC: 24-month follow-up for blood pressure, lipid and COPD PCP: Janora Norlander, DO Hector Torres is a 79 y.o. male presenting to clinic today for:  1.  Hypertension with hyperlipidemia/CAD Patient reports compliance with Coreg 6.25 mg twice daily, hydrochlorothiazide 12.5 mg daily, Avapro 75 mg daily, isosorbide 30 mg daily and Crestor 20 mg daily.  No chest pain, shortness of breath (outside of baseline), edema.  2.  COPD Patient has been compliant with Breo 100 mcg but has not really found a great deal of difference in his breathing.  Compared to the Anoro it is about the same.  3. Dysuria Patient reports a couple day history of suprapubic pain and mild dysuria.  He notes a foul odor to urine.  No hematuria, flank pain or fever.  ROS: Per HPI  Allergies  Allergen Reactions  . Norvasc [Amlodipine Besylate] Swelling and Other (See Comments)    LE swelling   Past Medical History:  Diagnosis Date  . Arthritis   . BPH (benign prostatic hypertrophy)   . Carotid artery disease (HCC)    Bilateral - Dr. Bridgett Larsson  . Colon polyp   . COPD (chronic obstructive pulmonary disease) ([place])   . Coronary atherosclerosis of native coronary artery    PTCA circumflex at [facility], nonobstructive residual disease at catheterization June and October 2016  . Essential hypertension, benign    Possible white coat  . GERD (gastroesophageal reflux disease)   . History of nephrolithiasis   . Mixed hyperlipidemia   . Prostate nodule   . PVD (peripheral vascular disease) ([place])   . TIA (transient ischemic attack)   . Vitamin D deficiency     Current Outpatient Medications:  .  aspirin 81 MG tablet, Take 81 mg by mouth daily., Disp: , Rfl:  .  carvedilol (COREG) 6.25 MG tablet, Take 1 tablet (6.25 mg total) by mouth 2 (two) times daily with a meal., Disp: 180 tablet, Rfl: 4 .  diphenhydramine-acetaminophen (TYLENOL PM) 25-500 MG TABS tablet, Take 2 tablets by mouth at bedtime as  needed (sleep/pain)., Disp: , Rfl:  .  fluticasone furoate-vilanterol (BREO ELLIPTA) 100-25 MCG/INH AEPB, Inhale 1 puff into the lungs daily., Disp: 28 each, Rfl: 12 .  hydrochlorothiazide (MICROZIDE) 12.5 MG capsule, TAKE 1 CAPSULE EVERY DAY, Disp: 90 capsule, Rfl: 3 .  irbesartan (AVAPRO) 75 MG tablet, TAKE 1 TABLET EVERY DAY, Disp: 90 tablet, Rfl: 3 .  isosorbide mononitrate (IMDUR) 30 MG 24 hr tablet, TAKE 1 TABLET TWICE DAILY, Disp: 180 tablet, Rfl: 0 .  Multiple Vitamin (MULTIVITAMIN PO), Take 1 tablet by mouth daily.  , Disp: , Rfl:  .  rosuvastatin (CRESTOR) 20 MG tablet, Take 1 tablet (20 mg total) by mouth daily., Disp: 90 tablet, Rfl: 3 .  umeclidinium-vilanterol (ANORO ELLIPTA) 62.5-25 MCG/INH AEPB, Inhale 1 puff into the lungs daily., Disp: 180 each, Rfl: 1 Social History   Socioeconomic History  . Marital status: Widowed    Spouse name: Not on file  . Number of children: Not on file  . Years of education: Not on file  . Highest education level: Not on file  Occupational History  . Not on file  Tobacco Use  . Smoking status: Former Smoker    Packs/day: 1.50    Years: 60.00    Pack years: 90.00    Types: Cigarettes    Quit date: 02/27/2011    Years since quitting: 9.0  . Smokeless tobacco: Never Used  Vaping Use  .  Vaping Use: Never used  Substance and Sexual Activity  . Alcohol use: No  . Drug use: No  . Sexual activity: Not on file  Other Topics Concern  . Not on file  Social History Narrative  . Not on file   Social Determinants of Health   Financial Resource Strain:   . Difficulty of Paying Living Expenses:   Food Insecurity:   . Worried About Charity fundraiser in the Last Year:   . Arboriculturist in the Last Year:   Transportation Needs:   . Film/video editor (Medical):   Marland Kitchen Lack of Transportation (Non-Medical):   Physical Activity:   . Days of Exercise per Week:   . Minutes of Exercise per Session:   Stress:   . Feeling of Stress :   Social  Connections:   . Frequency of Communication with Friends and Family:   . Frequency of Social Gatherings with Friends and Family:   . Attends Religious Services:   . Active Member of Clubs or Organizations:   . Attends Archivist Meetings:   Marland Kitchen Marital Status:   Intimate Partner Violence:   . Fear of Current or Ex-Partner:   . Emotionally Abused:   Marland Kitchen Physically Abused:   . Sexually Abused:    Family History  Problem Relation Age of Onset  . Gastric cancer Mother   . Varicose Veins Mother   . Cancer Mother        Stomach  . Prostate cancer Father   . Cancer Father        Prostate  . Other Brother        carotid artery stenosis  . Diabetes Brother   . Leukemia Brother   . Hyperlipidemia Brother   . Hypertension Brother   . Colon cancer Brother   . Hyperlipidemia Brother   . Peripheral vascular disease Brother   . Cancer Brother        Colon and Prostate  . Hypertension Sister   . Hyperlipidemia Sister   . Diabetes Daughter   . Diabetes Son   . Prostate cancer Brother     Objective: Office vital signs reviewed. BP (!) 172/94   Pulse 56   Temp (!) 97.2 F (36.2 C)   Ht 5\' 10"  (1.778 m)   Wt 199 lb 3.2 oz (90.4 kg)   SpO2 97%   BMI 28.58 kg/m   Physical Examination:  General: Awake, alert, well nourished, No acute distress HEENT: Normal. Sclera white, MMM Cardio: distant heart sounds but seemingly regular rate and rhythm, S1S2 heard, no murmurs appreciated Pulm: Globally decreased breath sounds, particularly in the bases.  Normal work of breathing on room air.  No wheezes, rhonchi or rales. Extremities: warm, well perfused, No edema, cyanosis or clubbing; +2 pulses bilaterally Skin: Actinic keratosis noted behind the left ear.  No bleeding.  Cryotherapy Procedure:  Risks and benefits of procedure were reviewed with the patient.  Verbal consent obtained and scanned into the chart.  Lesion of concern was identified and located on left post auricular  space.  Liquid nitrogen was applied to area of concern and extending out 1 millimeters beyond the border of the lesion.  Treated area was allowed to come back to room temperature before treating it a second time.  Patient tolerated procedure well and there were no immediate complications.  Home care instructions were reviewed with the patient and a handout was provided.   Assessment/ Plan: 79 y.o. male  1. Essential hypertension Not controlled.  Avapro increased to 1.5 tablets daily.  Follow up with me in 2 weeks or cardiology as scheduled - Basic Metabolic Panel  2. Mixed hyperlipidemia Continue statin  3. Dysuria No evidence of infection. Send for culture. - Urinalysis, Routine w reflex microscopic - Urine Culture  4. Chronic obstructive pulmonary disease, unspecified COPD type (HCC) Trial of Breztri - Budeson-Glycopyrrol-Formoterol (BREZTRI AEROSPHERE) 160-9-4.8 MCG/ACT AERO; Inhale 2 puffs into the lungs in the morning and at bedtime.  Dispense: 11.8 g; Refill: 0  5. Benign prostatic hyperplasia, unspecified whether lower urinary tract symptoms present - PSA  6. Actinic keratosis Treated with cryo.   Orders Placed This Encounter  Procedures  . Urine Culture  . Basic Metabolic Panel  . Urinalysis, Routine w reflex microscopic  . PSA   Meds ordered this encounter  Medications  . Budeson-Glycopyrrol-Formoterol (BREZTRI AEROSPHERE) 160-9-4.8 MCG/ACT AERO    Sig: Inhale 2 puffs into the lungs in the morning and at bedtime.    Dispense:  11.8 g    Refill:  0  . irbesartan (AVAPRO) 75 MG tablet    Sig: Take 1.5 tablets (112.5 mg total) by mouth daily.    Dispense:  90 tablet    Refill:  [place], [place] [phone]

## 2020-03-10 LAB — BASIC METABOLIC PANEL
BUN/Creatinine Ratio: 13 (ref 10–24)
BUN: 13 mg/dL (ref 8–27)
CO2: 29 mmol/L (ref 20–29)
Calcium: 9.3 mg/dL (ref 8.6–10.2)
Chloride: 102 mmol/L (ref 96–106)
Creatinine, Ser: 0.97 mg/dL (ref 0.76–1.27)
GFR calc Af Amer: 85 mL/min/{1.73_m2} (ref >59)
GFR calc non Af Amer: 73 mL/min/{1.73_m2} (ref >59)
Glucose: 77 mg/dL (ref 65–99)
Potassium: 4.1 mmol/L (ref 3.5–5.2)
Sodium: 143 mmol/L (ref 134–144)

## 2020-03-10 LAB — PSA: Prostate Specific Ag, Serum: 1.9 ng/mL (ref 0.0–4.0)

## 2020-03-13 ENCOUNTER — Other Ambulatory Visit: Payer: Self-pay | Admitting: Family Medicine

## 2020-03-13 LAB — URINE CULTURE

## 2020-03-13 MED ORDER — CIPROFLOXACIN HCL 500 MG PO TABS: 500.0000 mg | ORAL_TABLET | Freq: Two times a day (BID) | ORAL | 0 refills | Status: DC

## 2020-03-20 ENCOUNTER — Other Ambulatory Visit: Payer: Self-pay | Admitting: Family Medicine

## 2020-03-20 ENCOUNTER — Telehealth: Payer: Self-pay | Admitting: Family Medicine

## 2020-03-20 MED ORDER — IRBESARTAN 75 MG PO TABS: 150.0000 mg | ORAL_TABLET | Freq: Every day | ORAL | 3 refills | Status: DC

## 2020-03-20 MED ORDER — HYDROCHLOROTHIAZIDE 12.5 MG PO CAPS: 25.0000 mg | ORAL_CAPSULE | Freq: Every day | ORAL | 3 refills | Status: DC

## 2020-03-20 NOTE — Telephone Encounter (Signed)
Advised to increase hydrochlorothiazide to 25 mg daily.  Increase Avapro to 150 mg daily.  Continue monitoring blood pressures daily.  I would like him to come in for a nurses visit in a few days to check blood pressures.  We discussed red flag signs and symptoms warranting further evaluation emergency department.  Recommended contacting cardiology as well for any additional advice.  He voiced good understanding will follow prn.

## 2020-03-20 NOTE — Telephone Encounter (Signed)
Pt states his BP has been running high even with the medication changes. At the dentist last week it was 242/98 and then most of his readings are in the 180-190 over 80-90 with his home BP cuff. Pt denies any headaches, visual disturbances, feeling dizzy or lightheaded.

## 2020-03-21 ENCOUNTER — Telehealth: Payer: Self-pay | Admitting: Cardiology

## 2020-03-21 NOTE — Telephone Encounter (Signed)
Please give pt a call concerning BP med changes

## 2020-03-21 NOTE — Telephone Encounter (Signed)
States that his blood pressure has been up & down over the last month.  Most recent change to his medications was increasing the HCTZ to 12.5mg  twice a day & increased the Irbersartan 75mg  to 2  tabs daily.  Stated that he did call his pcp back yesterday & was given instructions for the HCTZ.  Stated that his pcp wanted him to notify Dr. Domenic Polite to put him in the loop of things.  No other c/o sob, dizziness, or chest pain curently.    7/29 - 3:50 pm - 156/79  66          10:50 pm - 161/78  62  7/30 - 4:00 pm - 150/83  72          11:00 pm - 183/85  73  7/31 - 1:00 pm - 182/93  64  Has OV scheduled for 04/20/2020.    8/1 - 1:30 pm - 149/80  64   8/2 4:00 pm - 190/91  64  8/3 - 11:00 am - 211/98  65            2:00 pm - 171/87  61

## 2020-03-21 NOTE — Telephone Encounter (Signed)
Patient notified and verbalized understanding. 

## 2020-03-21 NOTE — Telephone Encounter (Signed)
Thank you for letting me know.  Since he just had medications adjusted, I would see the result of these changes before making any additional modifications.

## 2020-03-24 ENCOUNTER — Ambulatory Visit (INDEPENDENT_AMBULATORY_CARE_PROVIDER_SITE_OTHER): Payer: Medicare HMO | Admitting: *Deleted

## 2020-03-24 ENCOUNTER — Other Ambulatory Visit: Payer: Self-pay

## 2020-03-24 DIAGNOSIS — I10 Essential (primary) hypertension: Secondary | ICD-10-CM

## 2020-03-24 NOTE — Progress Notes (Signed)
Bp today 120/68 Hr- 72  Home readings:  118/73 132/83 164/88 132/79 210/97  Taking meds as directed. HR at home running in the 60s   Aware to continue monitoring  Will forward note to [place] and [employer]

## 2020-04-19 DIAGNOSIS — H353132 Nonexudative age-related macular degeneration, bilateral, intermediate dry stage: Secondary | ICD-10-CM | POA: Diagnosis not present

## 2020-04-19 DIAGNOSIS — H26493 Other secondary cataract, bilateral: Secondary | ICD-10-CM | POA: Diagnosis not present

## 2020-04-20 ENCOUNTER — Encounter: Payer: Self-pay | Admitting: Cardiology

## 2020-04-20 ENCOUNTER — Other Ambulatory Visit: Payer: Self-pay

## 2020-04-20 ENCOUNTER — Ambulatory Visit: Payer: Medicare HMO | Admitting: Cardiology

## 2020-04-20 VITALS — BP 158/72 | HR 61 | Ht 70.0 in | Wt 198.0 lb

## 2020-04-20 DIAGNOSIS — I25119 Atherosclerotic heart disease of native coronary artery with unspecified angina pectoris: Secondary | ICD-10-CM

## 2020-04-20 DIAGNOSIS — E782 Mixed hyperlipidemia: Secondary | ICD-10-CM

## 2020-04-20 MED ORDER — IRBESARTAN 75 MG PO TABS: 75.0000 mg | ORAL_TABLET | Freq: Two times a day (BID) | ORAL | 3 refills | Status: DC

## 2020-04-20 MED ORDER — HYDROCHLOROTHIAZIDE 25 MG PO TABS: 25.0000 mg | ORAL_TABLET | Freq: Every day | ORAL | 3 refills | Status: DC

## 2020-04-20 NOTE — Progress Notes (Signed)
Cardiology Office Note  Date: 04/20/2020   ID: Hector, Torres April 11, 1941, MRN [id number]  PCP:  Janora Norlander, DO  Cardiologist:  Rozann Lesches, MD Electrophysiologist:  None   Chief Complaint  Patient presents with   Cardiac follow-up    History of Present Illness: Hector Torres is an 79 y.o. male last seen in October 2020.  He presents for a routine visit.  States that he has done well without any significant angina since increasing Imdur dosing, in fact canceled the previously ordered Myoview since he was feeling better.  He does feel lightheaded occasionally, has had fluctuations in his blood pressure, but required up titration of therapy in the interim by PCP due to significantly elevated blood pressures.  I reviewed his recent lab work as noted below.  LDL is 57.  I personally reviewed his ECG today which shows sinus rhythm with diffuse ST-T wave abnormalities.  Past Medical History:  Diagnosis Date   Arthritis    BPH (benign prostatic hypertrophy)    Carotid artery disease (HCC)    Bilateral - Dr. Bridgett Larsson   Colon polyp    COPD (chronic obstructive pulmonary disease) ([place])    Coronary atherosclerosis of native coronary artery    PTCA circumflex at [facility], nonobstructive residual disease at catheterization June and October 2016   Essential hypertension, benign    Possible white coat   GERD (gastroesophageal reflux disease)    History of nephrolithiasis    Mixed hyperlipidemia    Prostate nodule    PVD (peripheral vascular disease) (HCC)    TIA (transient ischemic attack)    Vitamin D deficiency     Past Surgical History:  Procedure Laterality Date   ANGIOPLASTY Left 1995   Cir. Artery   APPENDECTOMY  1947   CARDIAC CATHETERIZATION N/A 02/03/2015   Procedure: Left Heart Cath and Cors/Grafts Angiography;  Surgeon: Sherren Mocha, MD;  Location: [place] [facility];  Service: Cardiovascular;  Laterality: N/A;   CARDIAC  CATHETERIZATION N/A 05/19/2015   Procedure: Left Heart Cath and Coronary Angiography;  Surgeon: Troy Sine, MD;  Location: [place] [facility];  Service: Cardiovascular;  Laterality: N/A;   COLONOSCOPY N/A 03/19/2018   Procedure: COLONOSCOPY;  Surgeon: Danie Binder, MD;  [facility];  Service: Endoscopy;  Laterality: N/A;  12:00   EYE SURGERY     Bilateral cataract   POLYPECTOMY  03/19/2018   Procedure: POLYPECTOMY;  Surgeon: Danie Binder, MD;  [facility]: [facility];  Service: Endoscopy;;   TONSILLECTOMY  1968   Traumatic injury  1962   Lost tip of index, middle, and ring finger/ right hand    Current Outpatient Medications  Medication Sig Dispense Refill   aspirin 81 MG tablet Take 81 mg by mouth daily.     carvedilol (COREG) 6.25 MG tablet Take 1 tablet (6.25 mg total) by mouth 2 (two) times daily with a meal. 180 tablet 4   diphenhydramine-acetaminophen (TYLENOL PM) 25-500 MG TABS tablet Take 2 tablets by mouth at bedtime as needed (sleep/pain).     Fluticasone Furoate-Vilanterol (BREO ELLIPTA IN) Inhale into the lungs.     hydrochlorothiazide (MICROZIDE) 12.5 MG capsule Take 2 capsules (25 mg total) by mouth daily. 90 capsule 3   irbesartan (AVAPRO) 75 MG tablet Take 2 tablets (150 mg total) by mouth daily. 90 tablet 3   isosorbide mononitrate (IMDUR) 30 MG 24 hr tablet TAKE 1 TABLET TWICE DAILY 180 tablet 0  Multiple Vitamin (MULTIVITAMIN PO) Take 1 tablet by mouth daily.       rosuvastatin (CRESTOR) 20 MG tablet Take 1 tablet (20 mg total) by mouth daily. 90 tablet 3   No current facility-administered medications for this visit.   Allergies:  Norvasc [amlodipine besylate]   ROS:   No palpitations or syncope.  Physical Exam: VS:  BP (!) 158/72    Pulse 61    Ht 5\' 10"  (1.778 m)    Wt 198 lb (89.8 kg)    SpO2 94%    BMI 28.41 kg/m , BMI Body mass index is 28.41 kg/m.  Wt Readings from Last 3 Encounters:  04/20/20 198 lb (89.8 kg)  03/09/20  199 lb 3.2 oz (90.4 kg)  09/10/19 203 lb (92.1 kg)    General: Patient appears comfortable at rest. HEENT: Conjunctiva and lids normal, wearing a mask. Neck: Supple, no elevated JVP or carotid bruits, no thyromegaly. Lungs: Clear to auscultation, nonlabored breathing at rest. Cardiac: Regular rate and rhythm, no S3, 2/6 systolic murmur, no pericardial rub. Extremities: No pitting edema, distal pulses 2+.  ECG:  An ECG dated 05/27/2019 was personally reviewed today and demonstrated:  Sinus rhythm with diffuse ST-T wave abnormalities and decreased R wave progression.  Recent Labwork: 09/10/2019: ALT 15; AST 19; Hemoglobin 16.0; Platelets 209; TSH 1.610 03/09/2020: BUN 13; Creatinine, Ser 0.97; Potassium 4.1; Sodium 143     Component Value Date/Time   CHOL 115 09/10/2019 1147   CHOL 102 12/25/2012 1251   TRIG 104 09/10/2019 1147   TRIG 82 12/25/2012 1251   HDL 39 (L) 09/10/2019 1147   HDL 36 (L) 12/25/2012 1251   CHOLHDL 2.9 09/10/2019 1147   LDLCALC 57 09/10/2019 1147   LDLCALC 50 12/25/2012 1251    Other Studies Reviewed Today:  Carotid Dopplers 05/13/2019: Summary: Right Carotid: Velocities in the right ICA are consistent with a 40-59% stenosis.  Left Carotid: Velocities in the left ICA are consistent with a 40-59% stenosis. Non-hemodynamically significant plaque <50% noted in the CCA.  Vertebrals: Right vertebral artery demonstrates antegrade flow. Left vertebral artery demonstrates bidirectional flow. Subclavians: Left subclavian artery was stenotic. Normal flow hemodynamics were seen in the right subclavian artery.  Cardiac catheterization 05/19/2015:  1st Diag lesion, 50% stenosed.  Prox LAD lesion, 20% stenosed.  Prox Cx lesion, 30% stenosed.  Mid Cx lesion, 20% stenosed.  Dist Cx lesion, 20% stenosed.  The left ventricular systolic function is normal.  Hyperdynamic LV function with an ejection  fraction of 70-75% with evidence for left ventricular hypertrophy without focal segmental wall motion abnormalities.  No significant coronary obstructive disease with mild 20% luminal narrowing in the proximal LAD, 50% ostial stenosis in a diminutive first diagonal branch; scattered smooth 30% proximal, 20% percent mid, and 20% distal left circumflex narrowing in a dominant left circumflex vessel and a small nondominant RCA.  Assessment and Plan:  1.  CAD with remote history of circumflex angioplasty and nonobstructive disease documented as of 2016.  At this time he does not describe any active angina on current medical therapy and we continue with observation.  ECG chronically abnormal.  Continue aspirin, Coreg, HCTZ, Avapro, Imdur, and Crestor.  2.  Mixed hyperlipidemia, tolerating Crestor with recent LDL 57.  Medication Adjustments/Labs and Tests Ordered: Current medicines are reviewed at length with the patient today.  Concerns regarding medicines are outlined above.   Tests Ordered: Orders Placed This Encounter  Procedures   EKG 12-Lead    Medication Changes: No  orders of the defined types were placed in this encounter.   Disposition:  Follow up 1 year in the [place] office.  Signed, Satira Sark, MD, [facility] 04/20/2020 4:10 PM    [facility] [place], [place], [place] [address] Phone: [phone]; Fax: [phone]

## 2020-04-20 NOTE — Patient Instructions (Addendum)
Medication Instructions:   Your physician recommends that you continue on your current medications as directed. Please refer to the Current Medication list given to you today.  Avapro (ibesartan) 75 mg twice daily & Hydrochlorothiazide 25 mg daily sent to your pharmacy  Labwork:  None  Testing/Procedures:  None  Follow-Up:  Your physician recommends that you schedule a follow-up appointment in: 1 year. You will receive a reminder letter in the mail in about 10 months reminding you to call and schedule your appointment. If you don't receive this letter, please contact our office.  Any Other Special Instructions Will Be Listed Below (If Applicable).  If you need a refill on your cardiac medications before your next appointment, please call your pharmacy.

## 2020-04-21 ENCOUNTER — Other Ambulatory Visit: Payer: Self-pay | Admitting: Family Medicine

## 2020-04-21 DIAGNOSIS — E782 Mixed hyperlipidemia: Secondary | ICD-10-CM

## 2020-04-25 ENCOUNTER — Encounter: Payer: Self-pay | Admitting: *Deleted

## 2020-04-25 NOTE — Progress Notes (Signed)
Fax notification received that [facility] approved ibesartan 75 mg BID through 08/18/2020

## 2020-05-18 ENCOUNTER — Ambulatory Visit (INDEPENDENT_AMBULATORY_CARE_PROVIDER_SITE_OTHER): Payer: Medicare HMO

## 2020-05-18 ENCOUNTER — Other Ambulatory Visit: Payer: Self-pay | Admitting: Cardiology

## 2020-05-18 DIAGNOSIS — I6523 Occlusion and stenosis of bilateral carotid arteries: Secondary | ICD-10-CM

## 2020-05-21 DIAGNOSIS — I959 Hypotension, unspecified: Secondary | ICD-10-CM | POA: Diagnosis not present

## 2020-05-21 DIAGNOSIS — R0689 Other abnormalities of breathing: Secondary | ICD-10-CM | POA: Diagnosis not present

## 2020-05-22 ENCOUNTER — Telehealth: Payer: Self-pay | Admitting: *Deleted

## 2020-05-22 DIAGNOSIS — I6523 Occlusion and stenosis of bilateral carotid arteries: Secondary | ICD-10-CM

## 2020-05-22 NOTE — Telephone Encounter (Signed)
Patient informed. Copy sent to PCP °

## 2020-05-22 NOTE — Telephone Encounter (Signed)
-----   Message from Satira Sark, MD sent at 05/20/2020  7:30 AM EDT ----- Results reviewed. Stable, moderate bilateral ICA stenoses. Continue ASA and statin with follow-up carotid Dopplers in one year.

## 2020-05-24 ENCOUNTER — Telehealth: Payer: Self-pay | Admitting: Cardiology

## 2020-05-24 ENCOUNTER — Other Ambulatory Visit: Payer: Self-pay | Admitting: Family Medicine

## 2020-05-24 NOTE — Telephone Encounter (Signed)
Noted.  I reviewed the strips from EMS, he was in sinus rhythm, no unusual bradycardia or pauses.  ECG shows sinus rhythm with nonspecific ST changes, R wave progression decreased in comparison to prior tracing but this could have been related to lead placement.  If he is feeling better, would recommend continued observation for now.  If he has a home blood pressure cuff would suggest checking blood pressure and heart rate periodically.

## 2020-05-24 NOTE — Telephone Encounter (Signed)
Patient came into office stating he had an episode on Sunday while at church- they called EMS and they ran an EKG, pt dropped off the strip and brought it for Dr. Domenic Polite to review.

## 2020-05-24 NOTE — Telephone Encounter (Signed)
Pt says he started feeling like he was going to pass out and c/o dizziness/blurred vision/sweating- lasted about 15 mins was evaluated by EMS who did not recommend pt be seen in ED - pt brought EKG by office for provider to review - BP 123/70 HR 60s by EMS - pt denies any symptoms since Sunday

## 2020-05-25 NOTE — Telephone Encounter (Signed)
Pt voiced understanding and will continue to monitor BP/HR/symptoms

## 2020-06-23 ENCOUNTER — Other Ambulatory Visit: Payer: Self-pay | Admitting: Cardiology

## 2020-09-07 ENCOUNTER — Encounter: Payer: Self-pay | Admitting: *Deleted

## 2020-09-07 NOTE — Progress Notes (Signed)
Faxed notification received that irbersartan 75 mg BID approved through 08/18/2021

## 2020-09-14 ENCOUNTER — Other Ambulatory Visit: Payer: Self-pay | Admitting: Family Medicine

## 2020-09-18 ENCOUNTER — Other Ambulatory Visit: Payer: Self-pay | Admitting: Family Medicine

## 2020-09-18 NOTE — Telephone Encounter (Signed)
Pt says that he does not know why BREO Rx was discounted of his med list. He was able to refill it January 3rd 2022. Pt scheduled apt for 10/27/2020 with Lajuana Ripple. Please call back.

## 2020-09-21 DIAGNOSIS — N401 Enlarged prostate with lower urinary tract symptoms: Secondary | ICD-10-CM | POA: Diagnosis not present

## 2020-09-21 DIAGNOSIS — R3912 Poor urinary stream: Secondary | ICD-10-CM | POA: Diagnosis not present

## 2020-09-21 DIAGNOSIS — N200 Calculus of kidney: Secondary | ICD-10-CM | POA: Diagnosis not present

## 2020-10-26 ENCOUNTER — Other Ambulatory Visit: Payer: Self-pay | Admitting: Family Medicine

## 2020-10-27 ENCOUNTER — Ambulatory Visit (INDEPENDENT_AMBULATORY_CARE_PROVIDER_SITE_OTHER): Payer: Medicare HMO | Admitting: Family Medicine

## 2020-10-27 ENCOUNTER — Other Ambulatory Visit: Payer: Self-pay

## 2020-10-27 VITALS — BP 172/88 | HR 64 | Temp 96.9°F | Ht 70.0 in | Wt 204.0 lb

## 2020-10-27 DIAGNOSIS — J449 Chronic obstructive pulmonary disease, unspecified: Secondary | ICD-10-CM | POA: Diagnosis not present

## 2020-10-27 DIAGNOSIS — N3000 Acute cystitis without hematuria: Secondary | ICD-10-CM

## 2020-10-27 DIAGNOSIS — N400 Enlarged prostate without lower urinary tract symptoms: Secondary | ICD-10-CM | POA: Diagnosis not present

## 2020-10-27 DIAGNOSIS — I25118 Atherosclerotic heart disease of native coronary artery with other forms of angina pectoris: Secondary | ICD-10-CM | POA: Diagnosis not present

## 2020-10-27 DIAGNOSIS — R0989 Other specified symptoms and signs involving the circulatory and respiratory systems: Secondary | ICD-10-CM | POA: Diagnosis not present

## 2020-10-27 DIAGNOSIS — R300 Dysuria: Secondary | ICD-10-CM | POA: Diagnosis not present

## 2020-10-27 LAB — URINALYSIS, COMPLETE
Bilirubin, UA: NEGATIVE
Glucose, UA: NEGATIVE
Ketones, UA: NEGATIVE
Nitrite, UA: NEGATIVE
Protein,UA: NEGATIVE
Specific Gravity, UA: 1.02 (ref 1.005–1.030)
Urobilinogen, Ur: 0.2 mg/dL (ref 0.2–1.0)
pH, UA: 7.5 (ref 5.0–7.5)

## 2020-10-27 LAB — MICROSCOPIC EXAMINATION
Epithelial Cells (non renal): NONE SEEN /hpf (ref 0–10)
WBC, UA: >30 /hpf — AB (ref 0–5)

## 2020-10-27 MED ORDER — CEFTRIAXONE SODIUM 1 G IJ SOLR
1.0000 g | Freq: Once | INTRAMUSCULAR | Status: AC
  Administered 2020-10-27: 1 g via INTRAMUSCULAR

## 2020-10-27 MED ORDER — CEPHALEXIN 500 MG PO CAPS: 500.0000 mg | ORAL_CAPSULE | Freq: Four times a day (QID) | ORAL | 0 refills | Status: DC

## 2020-10-27 NOTE — Progress Notes (Signed)
Subjective: CC: Urinary tract infection PCP: Janora Norlander, DO GEZ:MOQHUTM Hector Torres is a 80 y.o. male presenting to clinic today for:  1.  UTI Patient was diagnosed and treated by his urologist for urinary tract infection in February with Macrobid.  He has completed this but never felt that the symptoms fully resolved.  He is asking for repeat urinalysis today.  No reports of hematuria or fevers.  He does need PSA collected also for Dr. Lyndal Rainbow office  2.  Hypertension, CAD Patient reports compliance with his Avapro 75 mg daily, hydrochlorothiazide 25 mg daily, Imdur 30 mg daily and carvedilol 6.25 mg twice daily.  He is also on Crestor for cholesterol.  He does have history of NSTEMI.  He is followed by Dr. Domenic Polite.  He notes that he has been having fluctuating blood pressures for years but they seem to be higher lately.  Blood pressures range anywhere from 80s over 60s to 200s over 80s.  He is just had a "deal with it" in the past.  He does report years of atypical angina and chronic dyspnea on exertion.  He has history of COPD.    ROS: Per HPI  Allergies  Allergen Reactions  . Norvasc [Amlodipine Besylate] Swelling and Other (See Comments)    LE swelling   Past Medical History:  Diagnosis Date  . Arthritis   . BPH (benign prostatic hypertrophy)   . Carotid artery disease (HCC)    Bilateral - Dr. Bridgett Larsson  . Colon polyp   . COPD (chronic obstructive pulmonary disease) ([place])   . Coronary atherosclerosis of native coronary artery    PTCA circumflex at [facility], nonobstructive residual disease at catheterization June and October 2016  . Essential hypertension, benign    Possible white coat  . GERD (gastroesophageal reflux disease)   . History of nephrolithiasis   . Mixed hyperlipidemia   . Prostate nodule   . PVD (peripheral vascular disease) ([place])   . TIA (transient ischemic attack)   . Vitamin D deficiency     Current Outpatient Medications:  .  aspirin 81 MG  tablet, Take 81 mg by mouth daily., Disp: , Rfl:  .  BREO ELLIPTA 100-25 MCG/INH AEPB, TAKE 1 PUFF BY MOUTH EVERY DAY, Disp: 60 each, Rfl: 12 .  carvedilol (COREG) 6.25 MG tablet, TAKE 1 TABLET TWICE DAILY WITH MEALS, Disp: 180 tablet, Rfl: 0 .  diphenhydramine-acetaminophen (TYLENOL PM) 25-500 MG TABS tablet, Take 2 tablets by mouth at bedtime as needed (sleep/pain)., Disp: , Rfl:  .  hydrochlorothiazide (HYDRODIURIL) 25 MG tablet, Take 1 tablet (25 mg total) by mouth daily., Disp: 90 tablet, Rfl: 3 .  irbesartan (AVAPRO) 75 MG tablet, Take 1 tablet (75 mg total) by mouth 2 (two) times daily., Disp: 180 tablet, Rfl: 3 .  isosorbide mononitrate (IMDUR) 30 MG 24 hr tablet, TAKE 1 TABLET TWICE DAILY (NEED MD APPOINTMENT), Disp: 180 tablet, Rfl: 1 .  Multiple Vitamin (MULTIVITAMIN PO), Take 1 tablet by mouth daily.  , Disp: , Rfl:  .  rosuvastatin (CRESTOR) 20 MG tablet, TAKE 1 TABLET (20 MG TOTAL) BY MOUTH DAILY., Disp: 90 tablet, Rfl: 3 Social History   Socioeconomic History  . Marital status: Widowed    Spouse name: Not on file  . Number of children: Not on file  . Years of education: Not on file  . Highest education level: Not on file  Occupational History  . Not on file  Tobacco Use  . Smoking status: Former  Smoker    Packs/day: 1.50    Years: 60.00    Pack years: 90.00    Types: Cigarettes    Quit date: 02/27/2011    Years since quitting: 9.6  . Smokeless tobacco: Never Used  Vaping Use  . Vaping Use: Never used  Substance and Sexual Activity  . Alcohol use: No  . Drug use: No  . Sexual activity: Not on file  Other Topics Concern  . Not on file  Social History Narrative  . Not on file   Social Determinants of Health   Financial Resource Strain: Not on file  Food Insecurity: Not on file  Transportation Needs: Not on file  Physical Activity: Not on file  Stress: Not on file  Social Connections: Not on file  Intimate Partner Violence: Not on file   Family History   Problem Relation Age of Onset  . Gastric cancer Mother   . Varicose Veins Mother   . Cancer Mother        Stomach  . Prostate cancer Father   . Cancer Father        Prostate  . Other Brother        carotid artery stenosis  . Diabetes Brother   . Leukemia Brother   . Hyperlipidemia Brother   . Hypertension Brother   . Colon cancer Brother   . Hyperlipidemia Brother   . Peripheral vascular disease Brother   . Cancer Brother        Colon and Prostate  . Hypertension Sister   . Hyperlipidemia Sister   . Diabetes Daughter   . Diabetes Son   . Prostate cancer Brother     Objective: Office vital signs reviewed. BP (!) 172/88 Comment: manual  Pulse 64   Temp (!) 96.9 F (36.1 C) (Temporal)   Ht _0  (1.778 m)   Wt 204 lb (92.5 kg)   SpO2 97%   BMI 29.27 kg/m   Physical Examination:  General: Awake, alert, well nourished, No acute distress HEENT: Normal, sclera white, no carotid bruits Cardio: regular rate and rhythm, S1S2 heard, no murmurs appreciated Pulm: clear to auscultation bilaterally, no wheezes, rhonchi or rales; normal work of breathing on room air Extremities: warm, well perfused, trace ankle edema, No cyanosis or clubbing; +2 pulses bilaterally  Assessment/ Plan: 80 y.o. male   Acute cystitis without hematuria - Plan: Urinalysis, Complete, cephALEXin (KEFLEX) 500 MG capsule, Urine Culture, cefTRIAXone (ROCEPHIN) injection 1 g  Labile blood pressure - Plan: CMP14+EGFR, Ambulatory referral to [facility] - [facility] [place]  Atherosclerosis of native coronary artery of native heart with stable angina pectoris ([place]) - Plan: CMP14+EGFR, Ambulatory referral to Advanced Hypertension Clinic - [facility] [address]  Benign prostatic hyperplasia, unspecified whether lower urinary tract symptoms present - Plan: PSA  Urinalysis with ongoing evidence of urinary tract infection.  He had 2+ blood, 2+ leukocytes.  Over 30 white blood cells and few  bacteria noted on microscopy.  I have sent this for urine culture.  Will await results but in the meantime I have treated him with a gram of Rocephin and he will start Keflex 4 times daily tomorrow given automatic complicated UTI in this male patient status post treatment with Macrobid  Check PSA.  Will CC to his urologist  Blood pressure is very labile.  He describes blood pressures in the 80s over 60s but as high as 200s over 80s.  Apparently this has been an issue for years.  I am  referring him to hypertension clinic in hopes that they might be able to help [place].  Medical history is significant for coronary artery disease, TIA and NSTEMI  No orders of the defined types were placed in this encounter.  No orders of the defined types were placed in this encounter.    Janora Norlander, DO [place] [phone]

## 2020-10-28 LAB — CMP14+EGFR
ALT: 14 IU/L (ref 0–44)
AST: 16 IU/L (ref 0–40)
Albumin/Globulin Ratio: 1.9 (ref 1.2–2.2)
Albumin: 4.1 g/dL (ref 3.7–4.7)
Alkaline Phosphatase: 63 IU/L (ref 44–121)
BUN/Creatinine Ratio: 11 (ref 10–24)
BUN: 12 mg/dL (ref 8–27)
Bilirubin Total: 0.4 mg/dL (ref 0.0–1.2)
CO2: 28 mmol/L (ref 20–29)
Calcium: 8.9 mg/dL (ref 8.6–10.2)
Chloride: 103 mmol/L (ref 96–106)
Creatinine, Ser: 1.07 mg/dL (ref 0.76–1.27)
Globulin, Total: 2.2 g/dL (ref 1.5–4.5)
Glucose: 90 mg/dL (ref 65–99)
Potassium: 4.6 mmol/L (ref 3.5–5.2)
Sodium: 143 mmol/L (ref 134–144)
Total Protein: 6.3 g/dL (ref 6.0–8.5)
eGFR: 70 mL/min/{1.73_m2} (ref >59)

## 2020-10-28 LAB — PSA: Prostate Specific Ag, Serum: 1.5 ng/mL (ref 0.0–4.0)

## 2020-10-31 ENCOUNTER — Other Ambulatory Visit: Payer: Self-pay | Admitting: Family Medicine

## 2020-10-31 DIAGNOSIS — N390 Urinary tract infection, site not specified: Secondary | ICD-10-CM

## 2020-10-31 LAB — URINE CULTURE

## 2020-10-31 MED ORDER — AMOXICILLIN 875 MG PO TABS: 875.0000 mg | ORAL_TABLET | Freq: Two times a day (BID) | ORAL | 0 refills | Status: AC

## 2020-11-27 ENCOUNTER — Other Ambulatory Visit: Payer: Self-pay

## 2020-11-27 MED ORDER — BREO ELLIPTA 100-25 MCG/INH IN AEPB: INHALATION_SPRAY | RESPIRATORY_TRACT | 0 refills | Status: DC

## 2020-12-27 ENCOUNTER — Other Ambulatory Visit: Payer: Self-pay

## 2020-12-27 ENCOUNTER — Ambulatory Visit: Payer: Medicare HMO | Admitting: Cardiovascular Disease

## 2020-12-27 ENCOUNTER — Encounter: Payer: Self-pay | Admitting: Cardiovascular Disease

## 2020-12-27 VITALS — BP 174/88 | HR 67 | Ht 71.0 in | Wt 201.8 lb

## 2020-12-27 DIAGNOSIS — E782 Mixed hyperlipidemia: Secondary | ICD-10-CM

## 2020-12-27 DIAGNOSIS — I25118 Atherosclerotic heart disease of native coronary artery with other forms of angina pectoris: Secondary | ICD-10-CM

## 2020-12-27 DIAGNOSIS — I10 Essential (primary) hypertension: Secondary | ICD-10-CM | POA: Diagnosis not present

## 2020-12-27 MED ORDER — HYDRALAZINE HCL 25 MG PO TABS: 25.0000 mg | ORAL_TABLET | Freq: Two times a day (BID) | ORAL | 3 refills | Status: DC

## 2020-12-27 NOTE — Assessment & Plan Note (Signed)
Nonobstructive.  Continue aspirin, carvedilol, and rosuvastatin.

## 2020-12-27 NOTE — Assessment & Plan Note (Addendum)
Blood pressure is poorly controlled on multiple agents.  He is having episodes of low blood pressures that are symptomatic as well.  I suspect some of this may relate be related to only eating 1 meal a day and not being adequately hydrated.  We discussed limiting his caffeine to no more than 2 caffeinated beverages daily and increasing his fluid intake.  He will also work on trying to eat more regularly.  His blood pressures medication may also be stopped at times, as he is not taking them exactly 12 hours apart.  He will work on being more consistent with this.  Additionally, we will check renal artery Dopplers, renin/aldosterone levels, catecholamines and metanephrines.  TSH was normal last year.  He consents to monitoring in our [place] remote patient monitoring program.  He will track his BP twice daily.

## 2020-12-27 NOTE — Progress Notes (Signed)
Advanced Hypertension Clinic Initial Assessment:    Date:  12/27/2020   ID:  SAMIUELA WURTZEL, DOB January 19, 1941, MRN [id number]  PCP:  Janora Norlander, DO  Cardiologist:  Rozann Lesches, MD  Nephrologist:  Referring MD: Janora Norlander, DO   CC: Hypertension  History of Present Illness:    Hector Torres is a 80 y.o. male with a hx of carotid artery disease, nonobstructive CAD, essential hypertension (benign), COPD, nephrolithiasis, hyperlipidemia, GERD, PVD, TIA here to establish care in the hypertension clinic. He has struggled with labile hypertension for years, however lately it seems to be worse. It has ranged from the 123XX123, systolic to the 123456. He last saw his PCP 10/2020 and was referred to advanced hypertension clinic. He is a patient of Dr. Domenic Polite and was last seen 04/2020. At that time he reported some blood pressure fluctuation.  Today, he reports having hypertension since he was 80 yo. He presents a list of recorded blood pressures.  It has been as low as the 80s over 40s or as high as 123XX123 systolic.  When his blood pressure is low, he will feel it. Recently, he has had 2 episodes of lightheadedness and near-syncope due to low blood pressure. Just prior to the episodes he felt jittery and anxious. He was given water or apple juice and shortly recovered with rest. At onset of both episodes he was sitting down. First time he was outdoors sitting against a post. Second time was sitting inside his church, after which he was told that it was a complete syncopal episode, but he is unsure of this. Also, for 8 years he has had episodes of a clinching pain in his central chest with a duration of 3-4 hours.  Usually this is relieved with taking 3 tylenol and lying down for rest.  Had a cardiac cath most recently in 2016 that showed mild to moderate CAD that was medically managed.  His symptoms dissipate in 1-4 hours and are not exertional. He does not think he snores. Normally he sleeps 12  hours easily, and does not fall asleep easily during the day.   Mr. Meller is currently retired, but remains active with work around [employer], yard work, and Marketing executive. He does not participate in formal exercise due to shortness of breath (past several years) and some knee pain when walking. He smoked for about 60 years before quitting in 2011. The Breo inhaler helps with his breathing. He usually takes his medication at night before bed (10P-2 AM), and when he wakes up (10 AM-2 PM). Breakfast may consist of a piece of toast. Generally he eats one good meal a day, tries to eat some fruit and vegetables every day.  His appetite has been poor. Usually will have a couple cookies around 8 pm. He cooks his own meals and tries to limit his salt intake. Each day he drinks about 4 cups of coffee, and otherwise water. Very rarely he drinks soda, and never drinks alcohol. For pain, he takes Tylenol and rarely advil.  He also takes multivitamins, and Uriage eye contour for his eyes. His older brother had hypertension for a prolonged time. His father and mother died of cancer.   Past Medical History:  Diagnosis Date  . Arthritis   . BPH (benign prostatic hypertrophy)   . Carotid artery disease (HCC)    Bilateral - Dr. Bridgett Larsson  . Colon polyp   . COPD (chronic obstructive pulmonary disease) ([place])   . Coronary  atherosclerosis of native coronary artery    PTCA circumflex at [facility], nonobstructive residual disease at catheterization June and October 2016  . Essential hypertension, benign    Possible white coat  . GERD (gastroesophageal reflux disease)   . History of nephrolithiasis   . Mixed hyperlipidemia   . Prostate nodule   . PVD (peripheral vascular disease) ([place])   . TIA (transient ischemic attack)   . Vitamin D deficiency     Past Surgical History:  Procedure Laterality Date  . ANGIOPLASTY Left 1995   Cir. Artery  . APPENDECTOMY  1947  . CARDIAC CATHETERIZATION N/A 02/03/2015   Procedure: Left  Heart Cath and Cors/Grafts Angiography;  Surgeon: Sherren Mocha, MD;  Location: [place] [facility];  Service: Cardiovascular;  Laterality: N/A;  . CARDIAC CATHETERIZATION N/A 05/19/2015   Procedure: Left Heart Cath and Coronary Angiography;  Surgeon: Troy Sine, MD;  Location: [place] [facility];  Service: Cardiovascular;  Laterality: N/A;  . COLONOSCOPY N/A 03/19/2018   Procedure: COLONOSCOPY;  Surgeon: Danie Binder, MD;  [facility];  Service: Endoscopy;  Laterality: N/A;  12:00  . EYE SURGERY     Bilateral cataract  . POLYPECTOMY  03/19/2018   Procedure: POLYPECTOMY;  Surgeon: Danie Binder, MD;  [facility];  Service: Endoscopy;;  . TONSILLECTOMY  1968  . Traumatic injury  1962   Lost tip of index, middle, and ring finger/ right hand    Current Medications: Current Meds  Medication Sig  . aspirin 81 MG tablet Take 81 mg by mouth daily.  . carvedilol (COREG) 6.25 MG tablet TAKE 1 TABLET TWICE DAILY WITH MEALS  . diphenhydramine-acetaminophen (TYLENOL PM) 25-500 MG TABS tablet Take 2 tablets by mouth at bedtime as needed (sleep/pain).  . fluticasone furoate-vilanterol (BREO ELLIPTA) 100-25 MCG/INH AEPB TAKE 1 PUFF BY MOUTH EVERY DAY  . hydrALAZINE (APRESOLINE) 25 MG tablet Take 1 tablet (25 mg total) by mouth every 12 (twelve) hours.  . hydrochlorothiazide (HYDRODIURIL) 25 MG tablet Take 1 tablet (25 mg total) by mouth daily.  . irbesartan (AVAPRO) 75 MG tablet Take 1 tablet (75 mg total) by mouth 2 (two) times daily.  . isosorbide mononitrate (IMDUR) 30 MG 24 hr tablet TAKE 1 TABLET TWICE DAILY (NEED MD APPOINTMENT)  . Multiple Vitamin (MULTIVITAMIN PO) Take 1 tablet by mouth daily.  . rosuvastatin (CRESTOR) 20 MG tablet TAKE 1 TABLET (20 MG TOTAL) BY MOUTH DAILY.     Allergies:   Norvasc [amlodipine besylate]   Social History   Socioeconomic History  . Marital status: Widowed    Spouse name: Not on file  . Number of children: Not on file  . Years  of education: Not on file  . Highest education level: Not on file  Occupational History  . Not on file  Tobacco Use  . Smoking status: Former Smoker    Packs/day: 1.50    Years: 60.00    Pack years: 90.00    Types: Cigarettes    Quit date: 02/27/2011    Years since quitting: 9.8  . Smokeless tobacco: Never Used  Vaping Use  . Vaping Use: Never used  Substance and Sexual Activity  . Alcohol use: No  . Drug use: No  . Sexual activity: Not on file  Other Topics Concern  . Not on file  Social History Narrative  . Not on file   Social Determinants of Health   Financial Resource Strain: Low Risk   .  Difficulty of Paying Living Expenses: Not hard at all  Food Insecurity: No Food Insecurity  . Worried About Charity fundraiser in the Last Year: Never true  . Ran Out of Food in the Last Year: Never true  Transportation Needs: No Transportation Needs  . Lack of Transportation (Medical): No  . Lack of Transportation (Non-Medical): No  Physical Activity: Inactive  . Days of Exercise per Week: 0 days  . Minutes of Exercise per Session: 0 min  Stress: No Stress Concern Present  . Feeling of Stress : Only a little  Social Connections: Not on file     Family History: The patient's family history includes Cancer in his brother, father, and mother; Colon cancer in his brother; Diabetes in his brother, daughter, and son; Gastric cancer in his mother; Hyperlipidemia in his brother, brother, and sister; Hypertension in his brother and sister; Leukemia in his brother; Other in his brother; Peripheral vascular disease in his brother; Prostate cancer in his brother and father; Varicose Veins in his mother.  ROS:   Please see the history of present illness.    (+) Lightheadedness (+) Near-syncope, Syncope (+) Jittery, Anxiety (+) Clinching central chest pain (+) Shortness of breath (+) Knee pain (+) LE edema All other systems reviewed and are negative.  EKGs/Labs/Other Studies Reviewed:     EKG:   12/27/2020: NSR, rate 67, PACs, cannot rule-out prior anterior MI  Recent Labs: 10/27/2020: ALT 14; BUN 12; Creatinine, Ser 1.07; Potassium 4.6; Sodium 143   Recent Lipid Panel    Component Value Date/Time   CHOL 115 09/10/2019 1147   CHOL 102 12/25/2012 1251   TRIG 104 09/10/2019 1147   TRIG 82 12/25/2012 1251   HDL 39 (L) 09/10/2019 1147   HDL 36 (L) 12/25/2012 1251   CHOLHDL 2.9 09/10/2019 1147   LDLCALC 57 09/10/2019 1147   LDLCALC 50 12/25/2012 1251    Carotid Arterial Duplex Study 05/18/2020: Summary:  Right Carotid: Velocities in the right ICA are consistent with a 40-59%         stenosis. The ECA appears >50% stenosed.   Left Carotid: Velocities in the left ICA are consistent with a 40-59%  stenosis.   Vertebrals: Right vertebral artery demonstrates antegrade flow. Left  vertebral        artery demonstrates bidirectional flow.  Subclavians: Normal flow hemodynamics were seen in bilateral subclavian        arteries.   Left Heart Cath 05/19/2015:  1st Diag lesion, 50% stenosed.  Prox LAD lesion, 20% stenosed.  Prox Cx lesion, 30% stenosed.  Mid Cx lesion, 20% stenosed.  Dist Cx lesion, 20% stenosed.  The left ventricular systolic function is normal.   Hyperdynamic LV function with an ejection fraction of 70-75% with evidence for left ventricular hypertrophy without focal segmental wall motion abnormalities.  No significant coronary obstructive disease with mild 20% luminal narrowing in the proximal LAD, 50% ostial stenosis in a diminutive first diagonal branch; scattered smooth 30% proximal, 20% percent mid, and 20% distal left circumflex narrowing in a dominant left circumflex vessel and a small nondominant RCA.  RECOMMENDATION: Medical therapy.  CT Angio Chest 05/18/2015: IMPRESSION: 1. No CT findings for pulmonary embolism. 2. Atherosclerotic calcifications involving the thoracic aorta but no aneurysm or  dissection. 3. Emphysematous changes but no acute pulmonary findings. 4. Coronary artery calcifications.  Physical Exam:   VS:  BP (!) 174/88 (BP Location: Right Arm, Patient Position: Sitting, Cuff Size: Normal)   Pulse 67  Ht 5\' 11"  (1.803 m)   Wt 201 lb 12.8 oz (91.5 kg)   BMI 28.15 kg/m  , BMI Body mass index is 28.15 kg/m. GENERAL:  Well appearing HEENT: Pupils equal round and reactive, fundi not visualized, oral mucosa unremarkable NECK:  No jugular venous distention, waveform within normal limits, carotid upstroke brisk and symmetric, no bruits LUNGS:  Mild wheezing HEART:  RRR.  PMI not displaced or sustained,S1 and S2 within normal limits, no S3, no S4, no clicks, no rubs, no murmurs ABD:  Flat, positive bowel sounds normal in frequency in pitch, no bruits, no rebound, no guarding, no midline pulsatile mass, no hepatomegaly, no splenomegaly EXT:  2 plus pulses throughout, no edema, no cyanosis no clubbing SKIN:  No rashes no nodules NEURO:  Cranial nerves II through XII grossly intact, motor grossly intact throughout PSYCH:  Cognitively intact, oriented to person place and time   ASSESSMENT/PLAN:    Resistant hypertension Blood pressure is poorly controlled on multiple agents.  He is having episodes of low blood pressures that are symptomatic as well.  I suspect some of this may relate be related to only eating 1 meal a day and not being adequately hydrated.  We discussed limiting his caffeine to no more than 2 caffeinated beverages daily and increasing his fluid intake.  He will also work on trying to eat more regularly.  His blood pressures medication may also be stopped at times, as he is not taking them exactly 12 hours apart.  He will work on being more consistent with this.  Additionally, we will check renal artery Dopplers, renin/aldosterone levels, catecholamines and metanephrines.  TSH was normal last year.  He consents to monitoring in our [place] remote patient  monitoring program.  He will track his BP twice daily.  Coronary atherosclerosis of native coronary artery Nonobstructive.  Continue aspirin, carvedilol, and rosuvastatin.   Screening for Secondary Hypertension:  Causes 12/27/2020  Drugs/Herbals Screened     - Comments High caffeine intake  Renovascular HTN Screened     - Comments Check renal artery Dopplers  Sleep Apnea Screened     - Comments No snoring or apnea  Hyperthyroidism Screened  Hypothyroidism Screened  Hyperaldosteronism Screened     - Comments Check renin and aldosterone  Pheochromocytoma Screened     - Comments Check catecholamines and metanephrines  Cushing's Syndrome Screened     - Comments Check cortisol  Hyperparathyroidism N/A  Coarctation of the Aorta Screened     - Comments Blood pressure symmetric  Compliance Screened    Relevant Labs/Studies: Basic Labs Latest Ref Rng & Units 10/27/2020 03/09/2020 09/10/2019  Sodium 134 - 144 mmol/L 143 143 143  Potassium 3.5 - 5.2 mmol/L 4.6 4.1 4.1  Creatinine 0.76 - 1.27 mg/dL 1.07 0.97 1.31(H)    Thyroid  Latest Ref Rng & Units 09/10/2019 08/01/2017  TSH 0.450 - 4.500 uIU/mL 1.610 1.830             Renovascular  12/27/2020  Renal Artery [place] Completed Yes    Disposition:    FU with PharmD in 2 months.  Shawndell Schillaci C. Oval Linsey, MD, [facility] in 4 months.   Medication Adjustments/Labs and Tests Ordered: Current medicines are reviewed at length with the patient today.  Concerns regarding medicines are outlined above.  Orders Placed This Encounter  Procedures  . Catecholamines, Fractionated, Plasma  . Metanephrines, plasma  . Cortisol  . Aldosterone + renin activity w/ ratio  . EKG 12-Lead  . VAS US  RENAL ARTERY DUPLEX   Meds ordered this encounter  Medications  . hydrALAZINE (APRESOLINE) 25 MG tablet    Sig: Take 1 tablet (25 mg total) by mouth every 12 (twelve) hours.    Dispense:  180 tablet    Refill:  3   I,Mathew Stumpf,acting as a scribe for Skeet Latch, MD.,have documented all relevant documentation on the behalf of Skeet Latch, MD,as directed by  Skeet Latch, MD while in the presence of Skeet Latch, MD.  I, [place] Oval Linsey, MD have reviewed all documentation for this visit.  The documentation of the exam, diagnosis, procedures, and orders on 12/27/2020 are all accurate and complete.   Signed, Skeet Latch, MD  12/27/2020 3:18 PM    [facility]

## 2020-12-27 NOTE — Patient Instructions (Addendum)
Medication Instructions:  START HYDRALAZINE 25 MG TWICE A DAY   TAKE YOUR MORNING MEDICATIONS AND EVENING MEDICATIONS AS CLOSE TO 12 HOURS APART AS YOU CAN    Labwork: CATACHOLAMINES/METANEPHRINES/RENIN/ALDOSTERONE EARLY ONE MORNING AT DR MCDOWELL'S OFFICE   HOLD YOUR IRBESARTAN 2 DAYS PRIOR TO LABS, RESUME AFTER YOU HAVE YOUR LAB WORK     Testing/Procedures: Your physician has requested that you have a renal artery duplex. During this test, an ultrasound is used to evaluate blood flow to the kidneys. Allow one hour for this exam. Do not eat after midnight the day before and avoid carbonated beverages. Take your medications as you usually do. AT [facility] OFF[facility]    Follow-Up: 03/02/2021 AT 3:00 PM WITH PHARM D  05/03/2021 AT 1:45 WITH DR [facility] AT [facility] [facility]    Special Instructions:   LIMIT YOUR CAFFEINE TO 2 CAFFEINE DRINKS A DAY   INCREASE YOUR WATER INTAKE  MONITOR YOUR BLOOD PRESSURE TWICE A DA Y WITH MACHINE YOU ARE BEING SENT. IF YOU DO NOT GET YOUR MACHINE WITHIN 2 WEEKS CALL THE OFFICE TO FOLLOW UP [phone]   DASH Eating Plan DASH stands for "Dietary Approaches to Stop Hypertension." The DASH eating plan is a healthy eating plan that has been shown to reduce high blood pressure (hypertension). It may also reduce your risk for type 2 diabetes, heart disease, and stroke. The DASH eating plan may also help with weight loss. What are tips for following this plan?  General guidelines  Avoid eating more than 2,300 mg (milligrams) of salt (sodium) a day. If you have hypertension, you may need to reduce your sodium intake to 1,500 mg a day.  Limit alcohol intake to no more than 1 drink a day for nonpregnant women and 2 drinks a day for men. One drink equals 12 oz of beer, 5 oz of wine, or 1 oz of hard liquor.  Work with your health care provider to maintain a healthy body weight or to lose weight. Ask what an ideal weight is for you.  Get at least 30 minutes of  exercise that causes your heart to beat faster (aerobic exercise) most days of the week. Activities may include walking, swimming, or biking.  Work with your health care provider or diet and nutrition specialist (dietitian) to adjust your eating plan to your individual calorie needs. Reading food labels   Check food labels for the amount of sodium per serving. Choose foods with less than 5 percent of the Daily Value of sodium. Generally, foods with less than 300 mg of sodium per serving fit into this eating plan.  To find whole grains, look for the word "whole" as the first word in the ingredient list. Shopping  Buy products labeled as "low-sodium" or "no salt added."  Buy fresh foods. Avoid canned foods and premade or frozen meals. Cooking  Avoid adding salt when cooking. Use salt-free seasonings or herbs instead of table salt or sea salt. Check with your health care provider or pharmacist before using salt substitutes.  Do not fry foods. Cook foods using healthy methods such as baking, boiling, grilling, and broiling instead.  Cook with heart-healthy oils, such as olive, canola, soybean, or sunflower oil. Meal planning  Eat a balanced diet that includes: ? 5 or more servings of fruits and vegetables each day. At each meal, try to fill half of your plate with fruits and vegetables. ? Up to 6-8 servings of whole grains each day. ? Less than 6 oz of  lean meat, poultry, or fish each day. A 3-oz serving of meat is about the same size as a deck of cards. One egg equals 1 oz. ? 2 servings of low-fat dairy each day. ? A serving of nuts, seeds, or beans 5 times each week. ? Heart-healthy fats. Healthy fats called Omega-3 fatty acids are found in foods such as flaxseeds and coldwater fish, like sardines, salmon, and mackerel.  Limit how much you eat of the following: ? Canned or prepackaged foods. ? Food that is high in trans fat, such as fried foods. ? Food that is high in saturated fat,  such as fatty meat. ? Sweets, desserts, sugary drinks, and other foods with added sugar. ? Full-fat dairy products.  Do not salt foods before eating.  Try to eat at least 2 vegetarian meals each week.  Eat more home-cooked food and less restaurant, buffet, and fast food.  When eating at a restaurant, ask that your food be prepared with less salt or no salt, if possible. What foods are recommended? The items listed may not be a complete list. Talk with your dietitian about what dietary choices are best for you. Grains Whole-grain or whole-wheat bread. Whole-grain or whole-wheat pasta. Brown rice. [place]. Bulgur. Whole-grain and low-sodium cereals. Pita bread. Low-fat, low-sodium crackers. Whole-wheat flour tortillas. Vegetables Fresh or frozen vegetables (raw, steamed, roasted, or grilled). Low-sodium or reduced-sodium tomato and vegetable juice. Low-sodium or reduced-sodium tomato sauce and tomato paste. Low-sodium or reduced-sodium canned vegetables. Fruits All fresh, dried, or frozen fruit. Canned fruit in natural juice (without added sugar). Meat and other protein foods Skinless chicken or [place]. Ground chicken or [place]. Pork with fat trimmed off. Fish and seafood. Egg whites. Dried beans, peas, or lentils. Unsalted nuts, nut butters, and seeds. Unsalted canned beans. Lean cuts of beef with fat trimmed off. Low-sodium, lean deli meat. Dairy Low-fat (1%) or fat-free (skim) milk. Fat-free, low-fat, or reduced-fat cheeses. Nonfat, low-sodium ricotta or cottage cheese. Low-fat or nonfat yogurt. Low-fat, low-sodium cheese. Fats and oils Soft margarine without trans fats. Vegetable oil. Low-fat, reduced-fat, or light mayonnaise and salad dressings (reduced-sodium). Canola, safflower, olive, soybean, and sunflower oils. Avocado. Seasoning and other foods Herbs. Spices. Seasoning mixes without salt. Unsalted popcorn and pretzels. Fat-free sweets. What foods are not recommended? The  items listed may not be a complete list. Talk with your dietitian about what dietary choices are best for you. Grains Baked goods made with fat, such as croissants, muffins, or some breads. Dry pasta or rice meal packs. Vegetables Creamed or fried vegetables. Vegetables in a cheese sauce. Regular canned vegetables (not low-sodium or reduced-sodium). Regular canned tomato sauce and paste (not low-sodium or reduced-sodium). Regular tomato and vegetable juice (not low-sodium or reduced-sodium). Angie Fava. Olives. Fruits Canned fruit in a light or heavy syrup. Fried fruit. Fruit in cream or butter sauce. Meat and other protein foods Fatty cuts of meat. Ribs. Fried meat. Berniece Salines. Sausage. Bologna and other processed lunch meats. Salami. Fatback. Hotdogs. Bratwurst. Salted nuts and seeds. Canned beans with added salt. Canned or smoked fish. Whole eggs or egg yolks. Chicken or [place] with skin. Dairy Whole or 2% milk, cream, and half-and-half. Whole or full-fat cream cheese. Whole-fat or sweetened yogurt. Full-fat cheese. Nondairy creamers. Whipped toppings. Processed cheese and cheese spreads. Fats and oils Butter. Stick margarine. Lard. Shortening. Ghee. Bacon fat. Tropical oils, such as coconut, palm kernel, or palm oil. Seasoning and other foods Salted popcorn and pretzels. Onion salt, garlic salt, seasoned salt, table  salt, and sea salt. Worcestershire sauce. Tartar sauce. Barbecue sauce. Teriyaki sauce. Soy sauce, including reduced-sodium. Steak sauce. Canned and packaged gravies. Fish sauce. Oyster sauce. Cocktail sauce. Horseradish that you find on the shelf. Ketchup. Mustard. Meat flavorings and tenderizers. Bouillon cubes. Hot sauce and Tabasco sauce. Premade or packaged marinades. Premade or packaged taco seasonings. Relishes. Regular salad dressings. Where to find more information:  National Heart, Lung, and [place]: https://wilson-eaton.com/  American Heart Association:  www.heart.org Summary  The DASH eating plan is a healthy eating plan that has been shown to reduce high blood pressure (hypertension). It may also reduce your risk for type 2 diabetes, heart disease, and stroke.  With the DASH eating plan, you should limit salt (sodium) intake to 2,300 mg a day. If you have hypertension, you may need to reduce your sodium intake to 1,500 mg a day.  When on the DASH eating plan, aim to eat more fresh fruits and vegetables, whole grains, lean proteins, low-fat dairy, and heart-healthy fats.  Work with your health care provider or diet and nutrition specialist (dietitian) to adjust your eating plan to your individual calorie needs. This information is not intended to replace advice given to you by your health care provider. Make sure you discuss any questions you have with your health care provider. Document Released: 07/25/2011 Document Revised: 07/18/2017 Document Reviewed: 07/29/2016 Elsevier Patient Education  2020 [employer].

## 2021-01-04 ENCOUNTER — Ambulatory Visit (INDEPENDENT_AMBULATORY_CARE_PROVIDER_SITE_OTHER): Payer: Medicare HMO

## 2021-01-04 DIAGNOSIS — I10 Essential (primary) hypertension: Secondary | ICD-10-CM | POA: Diagnosis not present

## 2021-01-05 ENCOUNTER — Telehealth: Payer: Self-pay

## 2021-01-05 DIAGNOSIS — Z0000 Encounter for general adult medical examination without abnormal findings: Secondary | ICD-10-CM

## 2021-01-05 DIAGNOSIS — I10 Essential (primary) hypertension: Secondary | ICD-10-CM | POA: Diagnosis not present

## 2021-01-05 NOTE — Telephone Encounter (Signed)
Called patient to inquiry about the nature of his request for a call for vivify. Patient has not been able to set up his devices and has worked with [facility], but was unsuccessful. Patient was informed by Care Guide that she will inform the nurse of the patient's concern to see if she is able to provide him some assistance. Care Guide will also reach out to [place] [employer] to see what other options the patient has.

## 2021-01-12 ENCOUNTER — Telehealth: Payer: Self-pay | Admitting: Pharmacist Clinician (PhC)/ Clinical Pharmacy Specialist

## 2021-01-12 NOTE — Telephone Encounter (Signed)
LMOM for patient.  Vivify records several BP readings < 90/70.  Calling to see if patient symptomatic or any concerns.

## 2021-01-12 NOTE — Telephone Encounter (Signed)
Readings currently coming across with Viv[employer]

## 2021-01-12 NOTE — Telephone Encounter (Signed)
-----   Message from Avelino Leeds sent at 01/12/2021  3:46 PM EDT ----- Regarding: Vivify - low BP Hi Erasmo Downer,  I wanted to update you on this patient's reading for today. It was 88/66 with a pulse rate of 61. Not sure how low the BP can get before we should be alarmed.  Thanks, Amy

## 2021-01-22 ENCOUNTER — Telehealth: Payer: Self-pay

## 2021-01-22 DIAGNOSIS — Z0000 Encounter for general adult medical examination without abnormal findings: Secondary | ICD-10-CM

## 2021-01-22 NOTE — Telephone Encounter (Signed)
Called patient to offer health coaching to increase physical activity per pathway question response in [place]. Left patient a message to return call to Care Guide at [phone].

## 2021-01-22 NOTE — Telephone Encounter (Signed)
Patient returned call to discuss health coaching to increase physical activity. Patient stated that he doesn't do a lot of physical activity because he has trouble breathing. Patient shared that he walks regularly. He is currently not interested in health coaching.

## 2021-01-26 ENCOUNTER — Telehealth: Payer: Self-pay | Admitting: Pharmacist Clinician (PhC)/ Clinical Pharmacy Specialist

## 2021-01-26 NOTE — Telephone Encounter (Signed)
Spoke with patient - he is getting consistent systolic readings < 016, feeling washed out and tired.  States checking readings bid, however only morning reading is showing up on Vivify.  Have sent request to [facility] for help with this situation.  Patient currently on irbesartan 75 mg bid, carvedilol 6.25 mg bid, hydralazine 25 mg bid and hctz 25 mg qd.   Advised he hold hydralazine for now, continue all other meds.  Patient agreeable with plan.

## 2021-01-29 ENCOUNTER — Telehealth: Payer: Self-pay | Admitting: Pharmacist Clinician (PhC)/ Clinical Pharmacy Specialist

## 2021-01-29 NOTE — Telephone Encounter (Signed)
Home BP readings still < 103 systolic.  Advised to hold irbesartan tonight.  Ok to take in am if systolic pressure > 013, otherwise hold.  Will have him come for ADV HTN visit tomorrow at 2 pm rather than waiting until scheduled July appt.  Pt to bring all meds and BP cuff.  Pt voiced understanding

## 2021-01-30 ENCOUNTER — Other Ambulatory Visit: Payer: Self-pay

## 2021-01-30 ENCOUNTER — Ambulatory Visit (INDEPENDENT_AMBULATORY_CARE_PROVIDER_SITE_OTHER): Payer: Medicare HMO | Admitting: Pharmacist

## 2021-01-30 VITALS — BP 102/68 | HR 68 | Resp 16 | Ht 70.0 in | Wt 200.0 lb

## 2021-01-30 DIAGNOSIS — Z0000 Encounter for general adult medical examination without abnormal findings: Secondary | ICD-10-CM

## 2021-01-30 DIAGNOSIS — I10 Essential (primary) hypertension: Secondary | ICD-10-CM | POA: Diagnosis not present

## 2021-01-30 MED ORDER — HYDROCHLOROTHIAZIDE 25 MG PO TABS: 12.5000 mg | ORAL_TABLET | Freq: Every day | ORAL | 3 refills | Status: DC

## 2021-01-30 NOTE — Patient Instructions (Addendum)
Return for a follow up appointment in 4 weeks  Check your blood pressure at home daily (if able) and keep record of the readings.  Take your BP meds as follows: *STOP hydralazine - last dose was Saturday June/11 *DECREASE  HYDROCHLOROTHIAZIDE TO to 12.5mg  daily (1/2 TABLET OF 25MG )  Bring all of your meds, your BP cuff and your record of home blood pressures to your next appointment.  Exercise as you're able, try to walk approximately 30 minutes per day.  Keep salt intake to a minimum, especially watch canned and prepared boxed foods.  Eat more fresh fruits and vegetables and fewer canned items.  Avoid eating in fast food restaurants.    HOW TO TAKE YOUR BLOOD PRESSURE: Rest 5 minutes before taking your blood pressure.  Don't smoke or drink caffeinated beverages for at least 30 minutes before. Take your blood pressure before (not after) you eat. Sit comfortably with your back supported and both feet on the floor (don't cross your legs). Elevate your arm to heart level on a table or a desk. Use the proper sized cuff. It should fit smoothly and snugly around your bare upper arm. There should be enough room to slip a fingertip under the cuff. The bottom edge of the cuff should be 1 inch above the crease of the elbow. Ideally, take 3 measurements at one sitting and record the average.

## 2021-01-30 NOTE — Progress Notes (Signed)
Patient ID: Hector Torres                 DOB: 15-Sep-1940                      MRN: [id number]     HPI: Hector Torres is a 80 y.o. male referred by Dr.Belfry to HTN clinic. PMH includes CAD, carotid disease, hypertension, COPD, nephrolithiasis, hyperlipidemia, GERD, PVD, and TIA. Noted he suffers recurrent UTI , but last reported UTI treated in March. Patient reports complete resolution of urinary symptoms. Denies blood in urine, increased frequency, incontinence, or fever. Vivify report showing recurrent low BP and hydralazine was sopped with last dose June/06/2021. He denies dizziness, blurry vision, headaches, or increased fatigue. Noted home BP monitor is accurate. Patient attributes low BP on medication plus improvement in diet. [name] report drinking more coffee than water , but changed to decaffeinated coffee. Still eating 1 complete meal per day, with few snacks.  Current HTN meds:  Carvedilol 6.25mg  twice daily Isosorbide mononitrate 30mg  daily Irbesartan 75mg  twice daily HCTZ 25mg  daily  Previously tried:  Hydralazine - stopped d/t hypotension  BP goal: <130/80  Family History: family history includes Cancer in his brother, father, and mother; Colon cancer in his brother; Diabetes in his brother, daughter, and son; Gastric cancer in his mother; Hyperlipidemia in his brother, brother, and sister; Hypertension in his brother and sister; Leukemia in his brother; Other in his brother; Peripheral vascular disease in his brother; Prostate cancer in his brother and father; Varicose Veins in his mother.  Social History: former smoker, denies alcohol use  Diet: cut back on salt intake, eat once per daily and few snacks, drinks about 1 pint a day of water and more coffee  Exercise: walks around the house and yard  Home BP readings:  25 measurements in vivify since 01/13/21, variable with lowers at 69/48 and highest 182/84, HR range 38-94bpm  Wt Readings from Last 3 Encounters:   01/30/21 200 lb (90.7 kg)  12/27/20 201 lb 12.8 oz (91.5 kg)  10/27/20 204 lb (92.5 kg)   BP Readings from Last 3 Encounters:  01/30/21 102/68  12/27/20 (!) 174/88  10/27/20 (!) 172/88   Pulse Readings from Last 3 Encounters:  01/30/21 68  12/27/20 67  10/27/20 64    Renal function: Estimated Creatinine Clearance: 57.1 mL/min (by C-G formula based on SCr of 1.17 mg/dL).  Past Medical History:  Diagnosis Date   Arthritis    BPH (benign prostatic hypertrophy)    Carotid artery disease (HCC)    Bilateral - Dr. Bridgett Larsson   Colon polyp    COPD (chronic obstructive pulmonary disease) ([place])    Coronary atherosclerosis of native coronary artery    PTCA circumflex at [facility], nonobstructive residual disease at catheterization June and October 2016   Essential hypertension, benign    Possible white coat   GERD (gastroesophageal reflux disease)    History of nephrolithiasis    Mixed hyperlipidemia    Prostate nodule    PVD (peripheral vascular disease) (HCC)    TIA (transient ischemic attack)    Vitamin D deficiency     Current Outpatient Medications on File Prior to Visit  Medication Sig Dispense Refill   aspirin 81 MG tablet Take 81 mg by mouth daily.     carvedilol (COREG) 6.25 MG tablet TAKE 1 TABLET TWICE DAILY WITH MEALS 180 tablet 0   cholecalciferol (VITAMIN D3) 25 MCG (1000 UNIT) tablet  Take 1,000 Units by mouth every other day. Take 5 tablets by mouth every other day     diphenhydramine-acetaminophen (TYLENOL PM) 25-500 MG TABS tablet Take 2 tablets by mouth at bedtime as needed (sleep/pain).     fluticasone furoate-vilanterol (BREO ELLIPTA) 100-25 MCG/INH AEPB TAKE 1 PUFF BY MOUTH EVERY DAY 180 each 0   irbesartan (AVAPRO) 75 MG tablet Take 1 tablet (75 mg total) by mouth 2 (two) times daily. 180 tablet 3   isosorbide mononitrate (IMDUR) 30 MG 24 hr tablet TAKE 1 TABLET TWICE DAILY (NEED MD APPOINTMENT) 180 tablet 1   Multiple Vitamin (MULTIVITAMIN PO) Take 1 tablet by  mouth daily.     Multiple Vitamins-Minerals (PRESERVISION AREDS PO) Take by mouth daily.     rosuvastatin (CRESTOR) 20 MG tablet TAKE 1 TABLET (20 MG TOTAL) BY MOUTH DAILY. 90 tablet 3   No current facility-administered medications on file prior to visit.    Allergies  Allergen Reactions   Norvasc [Amlodipine Besylate] Swelling and Other (See Comments)    LE swelling    Blood pressure 102/68, pulse 68, resp. rate 16, height 5\' 10"  (1.778 m), weight 200 lb (90.7 kg), SpO2 91 %.  Resistant hypertension Blood pressure at goal during office visit, and patient denies increase fatigue, dizziness, or blurry vision. Noted multiple low BP measurements at home. Hydralazine was discontinued 3 days ago, will decrease HCTZ from 25 to 12.5mg  daily today, and schedule follow up in 4 weeks. Plan to decrease irbesartan from 75mg  BID to 75mg  daily if blood pressure remains low or pat develops symptoms of hypotension.  Patient also educated on s/sx of UTI. Complicated urinary infecction can provoke low blood pressure as well. Patient encouraged to contact PCP if blood in urine, difficulty to empty bladder, fever, chills, incontinence, or urning during urination noted.   Harlee Pursifull Rodriguez-Guzman PharmD, BCPS, [facility] [address] [place] [address] 02/05/2021 8:30 AM

## 2021-01-31 ENCOUNTER — Telehealth: Payer: Self-pay

## 2021-01-31 DIAGNOSIS — Z0000 Encounter for general adult medical examination without abnormal findings: Secondary | ICD-10-CM

## 2021-01-31 LAB — BASIC METABOLIC PANEL
BUN/Creatinine Ratio: 22 (ref 10–24)
BUN: 26 mg/dL (ref 8–27)
CO2: 21 mmol/L (ref 20–29)
Calcium: 9.2 mg/dL (ref 8.6–10.2)
Chloride: 101 mmol/L (ref 96–106)
Creatinine, Ser: 1.17 mg/dL (ref 0.76–1.27)
Glucose: 84 mg/dL (ref 65–99)
Potassium: 3.9 mmol/L (ref 3.5–5.2)
Sodium: 144 mmol/L (ref 134–144)
eGFR: 63 mL/min/{1.73_m2} (ref >59)

## 2021-01-31 NOTE — Telephone Encounter (Signed)
Patient called because his [place] monitor has been beeping on and off since yesterday. Patient stated that he had to setup his account again this morning and every thing went through, but it continues to beep.   Informed patient that his Vivify profile was updated yesterday and something may have been disrupted. Instructed patient to turn off monitor and charge it until he gets ready to check his blood pressure again. Informed patient that Raquel in Pharmacy that he met with yesterday will be following up with Lynn on his behalf.   Will call patient back on 02/01/21 to further assess status of patient's concern.

## 2021-02-04 DIAGNOSIS — I10 Essential (primary) hypertension: Secondary | ICD-10-CM | POA: Diagnosis not present

## 2021-02-05 ENCOUNTER — Encounter: Payer: Self-pay | Admitting: Pharmacist

## 2021-02-05 NOTE — Assessment & Plan Note (Addendum)
Blood pressure at goal during office visit, and patient denies increase fatigue, dizziness, or blurry vision. Noted multiple low BP measurements at home. Hydralazine was discontinued 3 days ago, will decrease HCTZ from 25 to 12.5mg  daily today, and schedule follow up in 4 weeks. Plan to decrease irbesartan from 75mg  BID to 75mg  daily if blood pressure remains low or pat develops symptoms of hypotension.  Patient also educated on s/sx of UTI. Complicated urinary infecction can provoke low blood pressure as well. Patient encouraged to contact PCP if blood in urine, difficulty to empty bladder, fever, chills, incontinence, or urning during urination noted.

## 2021-02-12 ENCOUNTER — Telehealth: Payer: Self-pay | Admitting: Pharmacist Clinician (PhC)/ Clinical Pharmacy Specialist

## 2021-02-12 NOTE — Telephone Encounter (Signed)
Reviewed BP readings in [place].  Pt still having issues with low BP readings in the mornings, while evenings are high normal or elevated.  Spoke with patient today - he reports feeling fine, no symptoms with having systolic BP < 742.  He prefers to have his pressure lower, so is not concerned about these readings.  Will leave medications as is for now and continue to monitor.  If evening numbers continue to stay elevated may consider increasing morning meds, but will watch for now.

## 2021-02-19 ENCOUNTER — Other Ambulatory Visit: Payer: Self-pay | Admitting: Family Medicine

## 2021-02-20 ENCOUNTER — Other Ambulatory Visit: Payer: Self-pay | Admitting: Family Medicine

## 2021-03-02 ENCOUNTER — Ambulatory Visit: Payer: Medicare HMO

## 2021-03-06 DIAGNOSIS — I10 Essential (primary) hypertension: Secondary | ICD-10-CM | POA: Diagnosis not present

## 2021-03-19 ENCOUNTER — Other Ambulatory Visit: Payer: Self-pay | Admitting: Cardiology

## 2021-03-26 ENCOUNTER — Telehealth: Payer: Self-pay | Admitting: Pharmacist Clinician (PhC)/ Clinical Pharmacy Specialist

## 2021-03-26 NOTE — Telephone Encounter (Signed)
Patient reports trouble with Vivify tablet, not picking up readings via bluetooth.  He was given tech support # to [employer].

## 2021-04-05 DIAGNOSIS — I10 Essential (primary) hypertension: Secondary | ICD-10-CM

## 2021-04-17 ENCOUNTER — Telehealth: Payer: Self-pay | Admitting: Pharmacist Clinician (PhC)/ Clinical Pharmacy Specialist

## 2021-04-17 NOTE — Telephone Encounter (Signed)
BP reading in vivify this morning was 218/100.  Yesterday all readings were 123XX123 systolic.    Reached out to patient this morning.  He states feeling fine, no concerns and unsure why pressure has spiked so high.  Unfortunately he has very volatile readings, ranging from 78/45 on Saturday to the 218/100 this morning.  Advised that he increase the irbesartan this morning only to 150 mg and continue all other medications.  He should watch for drop in pressure, at this point unable to determine what causes his spikes/drops.   Patient voiced understanding.

## 2021-04-19 ENCOUNTER — Other Ambulatory Visit: Payer: Self-pay | Admitting: Family Medicine

## 2021-04-19 DIAGNOSIS — E782 Mixed hyperlipidemia: Secondary | ICD-10-CM

## 2021-04-19 NOTE — Telephone Encounter (Signed)
1 refill given-pt has follow up with Dr Darnell Level 04/30/21.

## 2021-04-30 ENCOUNTER — Other Ambulatory Visit: Payer: Self-pay

## 2021-04-30 ENCOUNTER — Ambulatory Visit (INDEPENDENT_AMBULATORY_CARE_PROVIDER_SITE_OTHER): Payer: Medicare HMO

## 2021-04-30 ENCOUNTER — Encounter: Payer: Self-pay | Admitting: Family Medicine

## 2021-04-30 ENCOUNTER — Ambulatory Visit (INDEPENDENT_AMBULATORY_CARE_PROVIDER_SITE_OTHER): Payer: Medicare HMO | Admitting: Family Medicine

## 2021-04-30 VITALS — BP 199/94 | HR 68 | Temp 97.5°F | Ht 70.0 in | Wt 190.4 lb

## 2021-04-30 DIAGNOSIS — R0601 Orthopnea: Secondary | ICD-10-CM | POA: Diagnosis not present

## 2021-04-30 DIAGNOSIS — I25118 Atherosclerotic heart disease of native coronary artery with other forms of angina pectoris: Secondary | ICD-10-CM | POA: Diagnosis not present

## 2021-04-30 DIAGNOSIS — I10 Essential (primary) hypertension: Secondary | ICD-10-CM

## 2021-04-30 DIAGNOSIS — R0989 Other specified symptoms and signs involving the circulatory and respiratory systems: Secondary | ICD-10-CM

## 2021-04-30 NOTE — Progress Notes (Signed)
Subjective: CC: Uncontrolled hypertension PCP: Janora Norlander, DO Hector Torres is a 80 y.o. male presenting to clinic today for:  1.  Hypertension Patient reports that his blood pressure continues to fluctuate he is currently in a trial with Dr. Oval Linsey with regards to his blood pressure.  He at some point had good control blood pressure but then became hypotensive so his medications were de-escalated.  He has had renal ultrasound.  He denies any chest pain or dyspnea on exertion but admits to some orthopnea, specifically when he gets up to go to the bathroom at night and then lays back down to go back to bed.  He cannot go to sleep flat.  He denies any edema, change in exercise tolerance.  He is compliant with his Memory [facility] and asks for samples of this since he is in the donut hole at the end of October.  ROS: Per HPI  Allergies  Allergen Reactions   Norvasc [Amlodipine Besylate] Swelling and Other (See Comments)    LE swelling   Past Medical History:  Diagnosis Date   Arthritis    BPH (benign prostatic hypertrophy)    Carotid artery disease (HCC)    Bilateral - Dr. Bridgett Larsson   Colon polyp    COPD (chronic obstructive pulmonary disease) ([place])    Coronary atherosclerosis of native coronary artery    PTCA circumflex at [facility], nonobstructive residual disease at catheterization June and October 2016   Essential hypertension, benign    Possible white coat   GERD (gastroesophageal reflux disease)    History of nephrolithiasis    Mixed hyperlipidemia    Prostate nodule    PVD (peripheral vascular disease) (HCC)    TIA (transient ischemic attack)    Vitamin D deficiency     Current Outpatient Medications:    aspirin 81 MG tablet, Take 81 mg by mouth daily., Disp: , Rfl:    carvedilol (COREG) 6.25 MG tablet, TAKE 1 TABLET TWICE DAILY WITH MEALS, Disp: 180 tablet, Rfl: 0   cholecalciferol (VITAMIN D3) 25 MCG (1000 UNIT) tablet, Take 1,000 Units by mouth every other day. Take  5 tablets by mouth every other day, Disp: , Rfl:    diphenhydramine-acetaminophen (TYLENOL PM) 25-500 MG TABS tablet, Take 2 tablets by mouth at bedtime as needed (sleep/pain)., Disp: , Rfl:    fluticasone furoate-vilanterol (BREO ELLIPTA) 100-25 MCG/INH AEPB, INHALE 1 PUFF EVERY DAY, Disp: 180 each, Rfl: 0   hydrochlorothiazide (HYDRODIURIL) 25 MG tablet, Take 0.5 tablets (12.5 mg total) by mouth daily., Disp: 45 tablet, Rfl: 3   irbesartan (AVAPRO) 75 MG tablet, Take 1 tablet (75 mg total) by mouth 2 (two) times daily., Disp: 180 tablet, Rfl: 3   isosorbide mononitrate (IMDUR) 30 MG 24 hr tablet, TAKE 1 TABLET TWICE DAILY (NEED MD APPOINTMENT), Disp: 180 tablet, Rfl: 3   Multiple Vitamin (MULTIVITAMIN PO), Take 1 tablet by mouth daily., Disp: , Rfl:    Multiple Vitamins-Minerals (PRESERVISION AREDS PO), Take by mouth daily., Disp: , Rfl:    rosuvastatin (CRESTOR) 20 MG tablet, TAKE 1 TABLET EVERY DAY, Disp: 90 tablet, Rfl: 0 Social History   Socioeconomic History   Marital status: Widowed    Spouse name: Not on file   Number of children: Not on file   Years of education: Not on file   Highest education level: Not on file  Occupational History   Not on file  Tobacco Use   Smoking status: Former    Packs/day: 1.50  Years: 60.00    Pack years: 90.00    Types: Cigarettes    Quit date: 02/27/2011    Years since quitting: 10.1   Smokeless tobacco: Never  Vaping Use   Vaping Use: Never used  Substance and Sexual Activity   Alcohol use: No   Drug use: No   Sexual activity: Not on file  Other Topics Concern   Not on file  Social History Narrative   Not on file   Social Determinants of Health   Financial Resource Strain: Low Risk    Difficulty of Paying Living Expenses: Not hard at all  Food Insecurity: No Food Insecurity   Worried About Charity fundraiser in the Last Year: Never true   [employer] in the Last Year: Never true  Transportation Needs: No Transportation Needs    Lack of Transportation (Medical): No   Lack of Transportation (Non-Medical): No  Physical Activity: Inactive   Days of Exercise per Week: 0 days   Minutes of Exercise per Session: 0 min  Stress: No Stress Concern Present   Feeling of Stress : Only a little  Social Connections: Not on file  Intimate Partner Violence: Not on file   Family History  Problem Relation Age of Onset   Gastric cancer Mother    Varicose Veins Mother    Cancer Mother        Stomach   Prostate cancer Father    Cancer Father        Prostate   Other Brother        carotid artery stenosis   Diabetes Brother    Leukemia Brother    Hyperlipidemia Brother    Hypertension Brother    Colon cancer Brother    Hyperlipidemia Brother    Peripheral vascular disease Brother    Cancer Brother        Colon and Prostate   Hypertension Sister    Hyperlipidemia Sister    Diabetes Daughter    Diabetes Son    Prostate cancer Brother     Objective: Office vital signs reviewed. BP (!) 199/94   Pulse 68   Temp (!) 97.5 F (36.4 C)   Ht '5\' 10"'  (1.778 m)   Wt 190 lb 6.4 oz (86.4 kg)   SpO2 96%   BMI 27.32 kg/m   Physical Examination:  General: Awake, alert, well appearing male, No acute distress HEENT: Normal; no JVD Cardio: regular rate and rhythm, S1S2 heard, no murmurs appreciated Pulm: Globally decreased breath sounds without wheezes, rhonchi or rales.  Normal breathing on room air Extremities: warm, well perfused, No edema, cyanosis or clubbing; +2 pulses bilaterally MSK: Ambulating independently  Assessment/ Plan: 80 y.o. male   Labile blood pressure  Essential hypertension  Atherosclerosis of native coronary artery of native heart with stable angina pectoris ([place]) - Plan: Lipid panel, CMP14+EGFR  Orthopnea - Plan: Brain natriuretic peptide, DG Chest 2 View  Continues to have labile blood pressure uncontrolled blood pressure today.  Has appointment with his specialist on Thursday no changes  have been made since he is in trial of some sort  Fasting cholesterol panel, CMP ordered  He describes orthopnea but has no evidence of fluid overload or any other concerning symptoms or signs for heart failure.  Chest x-ray and BNP have been ordered.  Personal view of his chest x-ray did not show any pulmonary edema but he did have some irregularity in the right lower lung field.  Cannot tell if  this is his diaphragm or something else.  Awaiting formal review by radiology.  We discussed he may need sleep study.  He was amenable to this if his labs are unremarkable  No orders of the defined types were placed in this encounter.  No orders of the defined types were placed in this encounter.    Janora Norlander, DO [place] [phone]

## 2021-04-30 NOTE — Patient Instructions (Signed)
You had labs performed today.  You will be contacted with the results of the labs once they are available, usually in the next 3 business days for routine lab work.  If you have an active my chart account, they will be released to your MyChart.  If you prefer to have these labs released to you via telephone, please let us know.  If labs/ xray normal, may consider sleep test in [place].

## 2021-05-01 LAB — CMP14+EGFR
ALT: 16 IU/L (ref 0–44)
AST: 19 IU/L (ref 0–40)
Albumin/Globulin Ratio: 2.1 (ref 1.2–2.2)
Albumin: 4.1 g/dL (ref 3.6–4.6)
Alkaline Phosphatase: 60 IU/L (ref 44–121)
BUN/Creatinine Ratio: 10 (ref 10–24)
BUN: 10 mg/dL (ref 8–27)
Bilirubin Total: 0.6 mg/dL (ref 0.0–1.2)
CO2: 29 mmol/L (ref 20–29)
Calcium: 9.3 mg/dL (ref 8.6–10.2)
Chloride: 101 mmol/L (ref 96–106)
Creatinine, Ser: 0.98 mg/dL (ref 0.76–1.27)
Globulin, Total: 2 g/dL (ref 1.5–4.5)
Glucose: 88 mg/dL (ref 65–99)
Potassium: 5 mmol/L (ref 3.5–5.2)
Sodium: 143 mmol/L (ref 134–144)
Total Protein: 6.1 g/dL (ref 6.0–8.5)
eGFR: 77 mL/min/{1.73_m2} (ref >59)

## 2021-05-01 LAB — LIPID PANEL
Chol/HDL Ratio: 2.6 ratio (ref 0.0–5.0)
Cholesterol, Total: 109 mg/dL (ref 100–199)
HDL: 42 mg/dL (ref >39)
LDL Chol Calc (NIH): 50 mg/dL (ref 0–99)
Triglycerides: 88 mg/dL (ref 0–149)
VLDL Cholesterol Cal: 17 mg/dL (ref 5–40)

## 2021-05-01 LAB — BRAIN NATRIURETIC PEPTIDE: BNP: 225.3 pg/mL — ABNORMAL HIGH (ref 0.0–100.0)

## 2021-05-02 ENCOUNTER — Telehealth: Payer: Self-pay | Admitting: *Deleted

## 2021-05-02 NOTE — Telephone Encounter (Signed)
-----   Message from Satira Sark, MD sent at 05/01/2021  8:16 PM EDT ----- Results reviewed.  LDL well controlled at 50 on current regimen.

## 2021-05-02 NOTE — Telephone Encounter (Signed)
Patient informed. 

## 2021-05-03 ENCOUNTER — Other Ambulatory Visit: Payer: Self-pay

## 2021-05-03 ENCOUNTER — Encounter (HOSPITAL_BASED_OUTPATIENT_CLINIC_OR_DEPARTMENT_OTHER): Payer: Self-pay | Admitting: Cardiovascular Disease

## 2021-05-03 ENCOUNTER — Ambulatory Visit (HOSPITAL_BASED_OUTPATIENT_CLINIC_OR_DEPARTMENT_OTHER): Payer: Medicare HMO | Admitting: Cardiovascular Disease

## 2021-05-03 DIAGNOSIS — I25118 Atherosclerotic heart disease of native coronary artery with other forms of angina pectoris: Secondary | ICD-10-CM | POA: Diagnosis not present

## 2021-05-03 DIAGNOSIS — I6523 Occlusion and stenosis of bilateral carotid arteries: Secondary | ICD-10-CM

## 2021-05-03 DIAGNOSIS — I10 Essential (primary) hypertension: Secondary | ICD-10-CM

## 2021-05-03 DIAGNOSIS — E782 Mixed hyperlipidemia: Secondary | ICD-10-CM

## 2021-05-03 NOTE — Assessment & Plan Note (Signed)
Continue rosuvastatin.  

## 2021-05-03 NOTE — Assessment & Plan Note (Addendum)
Hector Torres blood pressure remains extraordinarily labile.  It quickly alters between 0000000 and [id number] systolic.  This is been documented both in the office and on his ambulatory monitoring.  He is minimally symptomatic with these changes.  We will check cortisol levels as well as catecholamines and metanephrines.  Renal artery Dopplers were negative.  He will come back for renal and and aldosterone levels.  He will hold the irbesartan 2 days prior and increase the carvedilol to 12.5 mg twice daily while he is holding the irbesartan.  I am suspicious that there may be a neurocardiogenic component.  It is difficult to be more aggressive with his blood pressure control as his pressure often drops to the 90s.  We will refer him to the [place] syncope clinic to see if they have any additional input.  We did discuss wearing compression socks.  He is also working to increase his fluid intake and is eating meals more regularly without any significant improvement in his symptoms.  Orthostatic VS checked in the office today.  His BP dropped significantly with changes in position. HR was essentially unchanged, making POTS unlikely.  BP was discordant in each arm.  Will check carotid Dopplers to assess for subclavian stenosis

## 2021-05-03 NOTE — Assessment & Plan Note (Signed)
Moderate stenosis bilaterally.  He follows up with Dr. Domenic Polite about this.  Lipids well-controlled.  Continue aspirin.

## 2021-05-03 NOTE — Progress Notes (Signed)
Advanced Hypertension Clinic Follow-up:    Date:  05/03/2021   ID:  Hector Torres, DOB 04-13-1941, MRN [id number]  PCP:  Janora Norlander, DO  Cardiologist:  Rozann Lesches, MD  Nephrologist:  Referring MD: Janora Norlander, DO   CC: Hypertension  History of Present Illness:    Hector Torres is a 80 y.o. male with a hx of carotid artery disease, nonobstructive CAD, essential hypertension (benign), COPD, nephrolithiasis, hyperlipidemia, GERD, PVD, TIA here for follow-up. He initially established care in the hypertension clinic 12/27/2020. He has struggled with labile hypertension for years, however lately it seems to be worse. It has ranged from the [id number], systolic to the [id number]. He last saw his PCP 10/2020 and was referred to advanced hypertension clinic. He is a patient of Dr. Domenic Polite and was last seen 04/2020. At that time he reported some blood pressure fluctuation.  At his initial visit, he had poorly controlled blood pressure that was very labile. He was only eating one meal a day and was not adequately hydrated. We discussed increasing fluid intake and eating more regularly. Hydralazine was added. He had renal artery dopplers 12/2020 that were normal. He enrolled in the [facility] and continues to have labile blood pressures ranging from 90s/50s to 190s/90s. He has followed closely with our pharmacist and has often been advised to hold irbesartan or hydralazine for his low blood pressures. Irbesartan was reduced to 75 mg a day on 01/2021. Cortisol, renin, aldosterone, and catacholamines have been ordered but not yet completed.  Today, he reports that he has not noticed any triggers or correlations to explain his fluctuating blood pressures. He has had no significant lifestyle changes. Typically his higher readings occur from mid-day to 4 PM. Sometimes his blood pressure is higher in the mornings. At one time, his blood pressure was 224/112 in the morning, and 88/58 around 4 PM. When his  blood pressure is too low, he feels "kind of woozy." He denies any falls or syncope. With higher blood pressures he remains asymptomatic. Also, he is experiencing intermittent, sharp pain in his central chest which does not last long. Normally the chest pain occurs while sitting, and not while he walks. Sometimes it occurs before going to sleep at night. For exercise, he walks about a quarter of a mile every day. However, he needs to stop and rest often, which he attributes to COPD. He is eating meals regularly, including more soups and salty foods. Coffee consumption has been successfully reduced by half, and he is making an effort to drink more fluids. At night, he uses his inhaler before bed. For his regimen, he will usually take Carvedilol, irbesartan, imdur, and HCTZ simultaneously, and make sure that his doses are 12 hours apart. He denies any palpitations. No headaches, syncope, orthopnea, or PND. Also has no lower extremity edema.   Past Medical History:  Diagnosis Date   Arthritis    BPH (benign prostatic hypertrophy)    Carotid artery disease ([place])    Bilateral - Dr. Bridgett Larsson   Colon polyp    COPD (chronic obstructive pulmonary disease) ([place])    Coronary atherosclerosis of native coronary artery    PTCA circumflex at [facility], nonobstructive residual disease at catheterization June and October 2016   Essential hypertension, benign    Possible white coat   GERD (gastroesophageal reflux disease)    History of nephrolithiasis    Mixed hyperlipidemia    Prostate nodule    PVD (peripheral vascular disease) (  [place])    TIA (transient ischemic attack)    Vitamin D deficiency     Past Surgical History:  Procedure Laterality Date   ANGIOPLASTY Left 1995   Cir. Artery   APPENDECTOMY  1947   CARDIAC CATHETERIZATION N/A 02/03/2015   Procedure: Left Heart Cath and Cors/Grafts Angiography;  Surgeon: Sherren Mocha, MD;  Location: [place] [facility];  Service: Cardiovascular;  Laterality: N/A;    CARDIAC CATHETERIZATION N/A 05/19/2015   Procedure: Left Heart Cath and Coronary Angiography;  Surgeon: Troy Sine, MD;  Location: [place] [facility];  Service: Cardiovascular;  Laterality: N/A;   COLONOSCOPY N/A 03/19/2018   Procedure: COLONOSCOPY;  Surgeon: Danie Binder, MD;  Location[facility];  Service: Endoscopy;  Laterality: N/A;  12:00   EYE SURGERY     Bilateral cataract   POLYPECTOMY  03/19/2018   Procedure: POLYPECTOMY;  Surgeon: Danie Binder, MD;  Location: [facility];  Service: Endoscopy;;   TONSILLECTOMY  1968   Traumatic injury  1962   Lost tip of index, middle, and ring finger/ right hand    Current Medications: Current Meds  Medication Sig   aspirin 81 MG tablet Take 81 mg by mouth daily.   carvedilol (COREG) 6.25 MG tablet TAKE 1 TABLET TWICE DAILY WITH MEALS   cholecalciferol (VITAMIN D3) 25 MCG (1000 UNIT) tablet Take 1,000 Units by mouth every other day. Take 5 tablets by mouth every other day   diphenhydramine-acetaminophen (TYLENOL PM) 25-500 MG TABS tablet Take 2 tablets by mouth at bedtime as needed (sleep/pain).   fluticasone furoate-vilanterol (BREO ELLIPTA) 100-25 MCG/INH AEPB INHALE 1 PUFF EVERY DAY   hydrochlorothiazide (HYDRODIURIL) 25 MG tablet Take 0.5 tablets (12.5 mg total) by mouth daily.   irbesartan (AVAPRO) 75 MG tablet Take 1 tablet (75 mg total) by mouth 2 (two) times daily.   isosorbide mononitrate (IMDUR) 30 MG 24 hr tablet TAKE 1 TABLET TWICE DAILY (NEED MD APPOINTMENT)   Multiple Vitamin (MULTIVITAMIN PO) Take 1 tablet by mouth daily.   Multiple Vitamins-Minerals (PRESERVISION AREDS PO) Take by mouth daily.   rosuvastatin (CRESTOR) 20 MG tablet TAKE 1 TABLET EVERY DAY     Allergies:   Norvasc [amlodipine besylate]   Social History   Socioeconomic History   Marital status: Widowed    Spouse name: Not on file   Number of children: Not on file   Years of education: Not on file   Highest education level: Not on file   Occupational History   Not on file  Tobacco Use   Smoking status: Former    Packs/day: 1.50    Years: 60.00    Pack years: 90.00    Types: Cigarettes    Quit date: 02/27/2011    Years since quitting: 10.1   Smokeless tobacco: Never  Vaping Use   Vaping Use: Never used  Substance and Sexual Activity   Alcohol use: No   Drug use: No   Sexual activity: Not on file  Other Topics Concern   Not on file  Social History Narrative   Not on file   Social Determinants of Health   Financial Resource Strain: Low Risk    Difficulty of Paying Living Expenses: Not hard at all  Food Insecurity: No Food Insecurity   Worried About Charity fundraiser in the Last Year: Never true   [place] in the Last Year: Never true  Transportation Needs: No Transportation Needs   Lack of Transportation (  Medical): No   Lack of Transportation (Non-Medical): No  Physical Activity: Inactive   Days of Exercise per Week: 0 days   Minutes of Exercise per Session: 0 min  Stress: No Stress Concern Present   Feeling of Stress : Only a little  Social Connections: Not on file     Family History: The patient's family history includes Cancer in his brother, father, and mother; Colon cancer in his brother; Diabetes in his brother, daughter, and son; Gastric cancer in his mother; Hyperlipidemia in his brother, brother, and sister; Hypertension in his brother and sister; Leukemia in his brother; Other in his brother; Peripheral vascular disease in his brother; Prostate cancer in his brother and father; Varicose Veins in his mother.  ROS:   Please see the history of present illness.    (+) Lightheadedness (+) Sharp, Central chest pain All other systems reviewed and are negative.  EKGs/Labs/Other Studies Reviewed:    Renal Artery Doppler 01/04/2021: Summary:  Largest Aortic Diameter: 2.0 cm     Renal:     Right: Normal size right kidney. Abnormal right Resistive Index.         Normal cortical thickness  of right kidney. No evidence of         right renal artery stenosis. RRV flow present.  Left:  Normal size of left kidney. Abnormal left Resisitve Index.         Normal cortical thickness of the left kidney. No evidence of         left renal artery stenosis. LRV flow present.  Mesenteric:  Normal Celiac artery and Superior Mesenteric artery findings.  Carotid Arterial Duplex Study 05/18/2020: Summary:  Right Carotid: Velocities in the right ICA are consistent with a 40-59%                 stenosis. The ECA appears >50% stenosed.   Left Carotid: Velocities in the left ICA are consistent with a 40-59%  stenosis.   Vertebrals:  Right vertebral artery demonstrates antegrade flow. Left  vertebral               artery demonstrates bidirectional flow.  Subclavians: Normal flow hemodynamics were seen in bilateral subclavian               arteries.   Left Heart Cath 05/19/2015: 1st Diag lesion, 50% stenosed. Prox LAD lesion, 20% stenosed. Prox Cx lesion, 30% stenosed. Mid Cx lesion, 20% stenosed. Dist Cx lesion, 20% stenosed. The left ventricular systolic function is normal.   Hyperdynamic LV function with an ejection fraction of 70-75% with evidence for left ventricular hypertrophy without focal segmental wall motion abnormalities.   No significant coronary obstructive disease with mild 20% luminal narrowing in the proximal LAD, 50% ostial stenosis in a diminutive first diagonal branch; scattered smooth 30% proximal, 20% percent mid, and 20% distal left circumflex narrowing in a dominant left circumflex vessel and a small nondominant RCA.   RECOMMENDATION: Medical therapy.  CT Angio Chest 05/18/2015: IMPRESSION: 1. No CT findings for pulmonary embolism. 2. Atherosclerotic calcifications involving the thoracic aorta but no aneurysm or dissection. 3. Emphysematous changes but no acute pulmonary findings. 4. Coronary artery calcifications.  EKG:   05/03/2021: EKG is not ordered  today. 12/27/2020: NSR, rate 67, PACs, cannot rule-out prior anterior MI  Recent Labs: 04/30/2021: ALT 16; BNP 225.3; BUN 10; Creatinine, Ser 0.98; Potassium 5.0; Sodium 143   Recent Lipid Panel    Component Value Date/Time   CHOL 109 04/30/2021 1511  CHOL 102 12/25/2012 1251   TRIG 88 04/30/2021 1511   TRIG 82 12/25/2012 1251   HDL 42 04/30/2021 1511   HDL 36 (L) 12/25/2012 1251   CHOLHDL 2.6 04/30/2021 1511   LDLCALC 50 04/30/2021 1511   LDLCALC 50 12/25/2012 1251    Physical Exam:   VS:  BP (!) 175/87   Pulse 92   Ht '5\' 10"'$  (1.778 m)   Wt 190 lb 9.6 oz (86.5 kg)   SpO2 95%   BMI 27.35 kg/m  , BMI Body mass index is 27.35 kg/m. GENERAL:  Well appearing HEENT: Pupils equal round and reactive, fundi not visualized, oral mucosa unremarkable NECK:  No jugular venous distention, waveform within normal limits, carotid upstroke brisk and symmetric, no bruits LUNGS: Clear to auscultation bilaterally HEART:  RRR.  PMI not displaced or sustained,S1 and S2 within normal limits, no S3, no S4, no clicks, no rubs, no murmurs ABD:  Flat, positive bowel sounds normal in frequency in pitch, no bruits, no rebound, no guarding, no midline pulsatile mass, no hepatomegaly, no splenomegaly EXT:  2 plus pulses throughout, no edema, no cyanosis no clubbing SKIN:  No rashes no nodules NEURO:  Cranial nerves II through XII grossly intact, motor grossly intact throughout PSYCH:  Cognitively intact, oriented to person place and time   ASSESSMENT/PLAN:    Coronary atherosclerosis of native coronary artery Status post PCI remotely.  He is doing well and has no angina.  Lipids are well have been controlled.  Blood pressure remains very labile.  Continue aspirin, carvedilol, HCTZ, irbesartan, Imdur, and rosuvastatin.  Carotid stenosis, bilateral Moderate stenosis bilaterally.  He follows up with Dr. Domenic Polite about this.  Lipids well-controlled.  Continue aspirin.  Resistant hypertension Mr.  Picco's blood pressure remains extraordinarily labile.  It quickly alters between 0000000 and [id number] systolic.  This is been documented both in the office and on his ambulatory monitoring.  He is minimally symptomatic with these changes.  We will check cortisol levels as well as catecholamines and metanephrines.  Renal artery Dopplers were negative.  He will come back for renal and and aldosterone levels.  He will hold the irbesartan 2 days prior and increase the carvedilol to 12.5 mg twice daily while he is holding the irbesartan.  I am suspicious that there may be a neurocardiogenic component.  It is difficult to be more aggressive with his blood pressure control as his pressure often drops to the 90s.  We will refer him to the [facility] syncope clinic to see if they have any additional input.  We did discuss wearing compression socks.  He is also working to increase his fluid intake and is eating meals more regularly without any significant improvement in his symptoms.  Orthostatic VS checked in the office today.  His BP dropped significantly with changes in position. HR was essentially unchanged, making POTS unlikely.  BP was discordant in each arm.  Will check carotid Dopplers to assess for subclavian stenosis  Mixed hyperlipidemia Continue rosuvastatin.    Screening for Secondary Hypertension:  Causes 12/27/2020 05/03/2021  Drugs/Herbals Screened Screened     - Comments High caffeine intake He is reducing caffeine intake  Renovascular HTN Screened Screened     - Comments Check renal artery Dopplers Renal Dopplers negative 12/2020  Sleep Apnea Screened N/A     - Comments No snoring or apnea no symptoms  Hyperthyroidism Screened -  Thyroid Disease Screened Screened  Hyperaldosteronism Screened Screened     - Comments Check  renin and aldosterone check renin and aldosterone  Pheochromocytoma Screened Screened     - Comments Check catecholamines and metanephrines check catecholamines and metanephrines   Cushing's Syndrome Screened Screened     - Comments Check cortisol check cortisol  Hyperparathyroidism N/A Screened     - Comments - calcium levels normal  Coarctation of the Aorta Screened (No Data)     - Comments Blood pressure symmetric L arm bp was significantly higher.  Check carotid Doppler to assess for subclavian stenosis  Compliance Screened Screened    Relevant Labs/Studies: Basic Labs Latest Ref Rng & Units 04/30/2021 01/30/2021 10/27/2020  Sodium 134 - 144 mmol/L 143 144 143  Potassium 3.5 - 5.2 mmol/L 5.0 3.9 4.6  Creatinine 0.76 - 1.27 mg/dL 0.98 1.17 1.07    Thyroid  Latest Ref Rng & Units 09/10/2019 08/01/2017  TSH 0.450 - 4.500 uIU/mL 1.610 1.830             Renovascular  01/04/2021  Renal Artery [place] Completed Yes    Disposition:    FU with Avram Danielson C. Oval Linsey, MD, [facility] in 3 months.  Medication Adjustments/Labs and Tests Ordered: Current medicines are reviewed at length with the patient today.  Concerns regarding medicines are outlined above.  Orders Placed This Encounter  Procedures   Ambulatory referral to Cardiac Electrophysiology   VAS US CAROTID    No orders of the defined types were placed in this encounter.  I,Mathew Stumpf,acting as a Education administrator for Skeet Latch, MD.,have documented all relevant documentation on the behalf of Skeet Latch, MD,as directed by  Skeet Latch, MD while in the presence of Skeet Latch, MD.  I, Az[place] Oval Linsey, MD have reviewed all documentation for this visit.  The documentation of the exam, diagnosis, procedures, and orders on 05/03/2021 are all accurate and complete.   Signed, Skeet Latch, MD  05/03/2021 4:24 PM    [place]

## 2021-05-03 NOTE — Assessment & Plan Note (Signed)
Status post PCI remotely.  He is doing well and has no angina.  Lipids are well have been controlled.  Blood pressure remains very labile.  Continue aspirin, carvedilol, HCTZ, irbesartan, Imdur, and rosuvastatin.

## 2021-05-03 NOTE — Patient Instructions (Addendum)
2 DAYS PRIOR TO GOING FOR YOUR LABS DO BELOW:  NO LOSARTAN FOR THE 2 DAYS PRIOR TO YOUR LABS. DURING THIS TIME YOU WILL DOUBLE YOUR CARVEDILOL   RESUME YOUR LOSARTAN AFTER YOU HAVE LAB WORK AND GO BACK TO YOUR REGULAR DOSE OF CARVEDILOL   GO FOR LABS SOON, FIRST THING IN THE MORNING   Your physician has requested that you have a carotid duplex. This test is an ultrasound of the carotid arteries in your neck. It looks at blood flow through these arteries that supply the brain with blood. Allow one hour for this exam. There are no restrictions or special instructions.   FOLLOW UP WITH DR  AT [facility]  07/31/2021 1:30 PM   YOU HAVE BEEN REFERRED TO THE [facility] AT [facility]  IF YOU DO NOT HEAR FROM THEM IN 2 WEEKS PLEASE CALL THE OFFICE TO FOLLOW UP

## 2021-05-05 DIAGNOSIS — I10 Essential (primary) hypertension: Secondary | ICD-10-CM | POA: Diagnosis not present

## 2021-05-07 ENCOUNTER — Other Ambulatory Visit: Payer: Self-pay

## 2021-05-07 ENCOUNTER — Other Ambulatory Visit: Payer: Medicare HMO

## 2021-05-07 DIAGNOSIS — I10 Essential (primary) hypertension: Secondary | ICD-10-CM | POA: Diagnosis not present

## 2021-05-08 ENCOUNTER — Ambulatory Visit (HOSPITAL_COMMUNITY)
Admission: RE | Admit: 2021-05-08 | Discharge: 2021-05-08 | Disposition: A | Discharge status: Home / Self Care | Payer: Medicare HMO | Source: Ambulatory Visit | Attending: Cardiology | Admitting: Cardiology

## 2021-05-08 ENCOUNTER — Other Ambulatory Visit (HOSPITAL_COMMUNITY): Payer: Self-pay | Admitting: Cardiovascular Disease

## 2021-05-08 DIAGNOSIS — I6523 Occlusion and stenosis of bilateral carotid arteries: Secondary | ICD-10-CM

## 2021-05-12 LAB — CATECHOLAMINES, FRACTIONATED, PLASMA
Dopamine: 193 pg/mL — ABNORMAL HIGH (ref 0–48)
Epinephrine: 64 pg/mL — ABNORMAL HIGH (ref 0–62)
Norepinephrine: 519 pg/mL (ref 0–874)

## 2021-05-12 LAB — METANEPHRINES, PLASMA
Metanephrine, Free: 38.1 pg/mL (ref 0.0–88.0)
Normetanephrine, Free: 91.2 pg/mL (ref 0.0–297.2)

## 2021-05-12 LAB — ALDOSTERONE + RENIN ACTIVITY W/ RATIO
ALDOS/RENIN RATIO: >47.3 — ABNORMAL HIGH (ref 0.0–30.0)
ALDOSTERONE: 7.9 ng/dL (ref 0.0–30.0)
Renin: <0.167 ng/mL/hr — ABNORMAL LOW (ref 0.167–5.380)

## 2021-05-12 LAB — CORTISOL: Cortisol: 21.7 ug/dL

## 2021-05-15 ENCOUNTER — Telehealth: Payer: Self-pay | Admitting: Pharmacist Clinician (PhC)/ Clinical Pharmacy Specialist

## 2021-05-15 NOTE — Telephone Encounter (Signed)
-----   Message from Avelino Leeds sent at 05/14/2021  1:37 PM EDT ----- Regarding: [facility] - elevated bp Hector Torres,  I wanted to put on your radar that [name] had some elevated readings over the weekend. Patient's highest readings were 217/96 and 209/81. Today's reading so far is 135/85.  Thanks, Amy

## 2021-05-15 NOTE — Telephone Encounter (Signed)
Spoke with patient.  He is unsure why his readings over the weekend were so high.  States no change in diet, did not eat out.  Will continue to monitor, in the past has had hypotensive readings, so need to be careful with medication adjustments.  If readings continue to elevate without any systolic < 286, may increase hctz to 25 mg daily

## 2021-05-29 ENCOUNTER — Telehealth (HOSPITAL_BASED_OUTPATIENT_CLINIC_OR_DEPARTMENT_OTHER): Payer: Self-pay | Admitting: *Deleted

## 2021-05-29 ENCOUNTER — Telehealth (HOSPITAL_BASED_OUTPATIENT_CLINIC_OR_DEPARTMENT_OTHER): Payer: Self-pay | Admitting: Cardiovascular Disease

## 2021-05-29 DIAGNOSIS — R899 Unspecified abnormal finding in specimens from other organs, systems and tissues: Secondary | ICD-10-CM

## 2021-05-29 DIAGNOSIS — I10 Essential (primary) hypertension: Secondary | ICD-10-CM

## 2021-05-29 NOTE — Telephone Encounter (Signed)
-----   Message from Skeet Latch, MD sent at 05/26/2021  3:03 PM EDT ----- Dopamine levels are high.  Aldosterone ratios are also elevated.  Sometimes this can mean that there is a benign tumor making too much hormones and making BP elevate.  Recommend a CT adrenal protocol to assess for adenomas.

## 2021-05-29 NOTE — Telephone Encounter (Signed)
Advised patient, order placed   

## 2021-05-29 NOTE — Telephone Encounter (Signed)
Spoke with patient regarding the Monday 06/25/21 4:00 pm CT adrenal Abd appt at [facility] --arrival time is 3:45 pm---pt aware to go and pick up 2 bottles of oral contrast a few days prior to study---patient also aware he will need additional lab work.  Will mail information to patient and he voiced his understanding.

## 2021-05-30 ENCOUNTER — Other Ambulatory Visit (HOSPITAL_BASED_OUTPATIENT_CLINIC_OR_DEPARTMENT_OTHER): Payer: Self-pay | Admitting: *Deleted

## 2021-05-30 DIAGNOSIS — Z01812 Encounter for preprocedural laboratory examination: Secondary | ICD-10-CM

## 2021-05-30 DIAGNOSIS — I10 Essential (primary) hypertension: Secondary | ICD-10-CM

## 2021-05-31 ENCOUNTER — Ambulatory Visit: Payer: Medicare HMO | Admitting: Cardiology

## 2021-05-31 ENCOUNTER — Encounter: Payer: Self-pay | Admitting: Cardiology

## 2021-05-31 ENCOUNTER — Other Ambulatory Visit: Payer: Self-pay

## 2021-05-31 VITALS — BP 162/80 | HR 70 | Ht 70.0 in | Wt 188.4 lb

## 2021-05-31 DIAGNOSIS — Z23 Encounter for immunization: Secondary | ICD-10-CM

## 2021-05-31 DIAGNOSIS — E782 Mixed hyperlipidemia: Secondary | ICD-10-CM

## 2021-05-31 DIAGNOSIS — R0989 Other specified symptoms and signs involving the circulatory and respiratory systems: Secondary | ICD-10-CM | POA: Diagnosis not present

## 2021-05-31 DIAGNOSIS — I25119 Atherosclerotic heart disease of native coronary artery with unspecified angina pectoris: Secondary | ICD-10-CM | POA: Diagnosis not present

## 2021-05-31 DIAGNOSIS — R0602 Shortness of breath: Secondary | ICD-10-CM

## 2021-05-31 NOTE — Progress Notes (Signed)
Cardiology Office Note  Date: 05/31/2021   ID: Hector Torres, [place] August 13, 1941, MRN [id number]  PCP:  Janora Norlander, DO  Cardiologist:  Rozann Lesches, MD Electrophysiologist:  None   Chief Complaint  Patient presents with   Cardiac follow-up     History of Present Illness: Hector Torres is an 80 y.o. male last seen in September 2021.  I reviewed the chart, he was referred by PCP to the hypertension clinic and is currently undergoing evaluation by Dr. Oval Linsey for management of labile hypertension and symptomatic orthostasis.  Autonomic dysfunction suspected to some degree, although he has further work-up in process to exclude potential pheochromocytoma.  He has not yet tried compression stockings.  Otherwise reports compliance with his current therapy.  From a cardiac perspective he does not describe any active angina, has felt short of breath however in the last few months.  No cough or wheezing.  States that he sometimes notices this at nighttime when he gets up to go to the bathroom.  He has had no swelling or unusual weight gain.  I reviewed his medications which are noted below.  I reviewed interval blood work and ultrasound studies.   Past Medical History:  Diagnosis Date   Arthritis    BPH (benign prostatic hypertrophy)    Carotid artery disease (HCC)    Bilateral - Dr. Bridgett Larsson   Colon polyp    COPD (chronic obstructive pulmonary disease) ([place])    Coronary atherosclerosis of native coronary artery    [facility]CA circumflex at [facility], nonobstructive residual disease at catheterization June and October 2016   Essential hypertension, benign    Possible white coat   GERD (gastroesophageal reflux disease)    History of nephrolithiasis    Mixed hyperlipidemia    Prostate nodule    PVD (peripheral vascular disease) (HCC)    TIA (transient ischemic attack)    Vitamin D deficiency     Past Surgical History:  Procedure Laterality Date   ANGIOPLASTY Left 1995    Cir. Artery   APPENDECTOMY  1947   CARDIAC CATHETERIZATION N/A 02/03/2015   Procedure: Left Heart Cath and Cors/Grafts Angiography;  Surgeon: Sherren Mocha, MD;  Location: [place] [facility];  Service: Cardiovascular;  Laterality: N/A;   CARDIAC CATHETERIZATION N/A 05/19/2015   Procedure: Left Heart Cath and Coronary Angiography;  Surgeon: Troy Sine, MD;  Location: [place] [facility];  Service: Cardiovascular;  Laterality: N/A;   COLONOSCOPY N/A 03/19/2018   Procedure: COLONOSCOPY;  Surgeon: Danie Binder, MD;  [facility];  Service: Endoscopy;  Laterality: N/A;  12:00   EYE SURGERY     Bilateral cataract   POLYPECTOMY  03/19/2018   Procedure: POLYPECTOMY;  Surgeon: Danie Binder, MD;  [facility]: [facility];  Service: Endoscopy;;   TONSILLECTOMY  1968   Traumatic injury  1962   Lost tip of index, middle, and ring finger/ right hand    Current Outpatient Medications  Medication Sig Dispense Refill   aspirin 81 MG tablet Take 81 mg by mouth daily.     carvedilol (COREG) 6.25 MG tablet TAKE 1 TABLET TWICE DAILY WITH MEALS 180 tablet 0   cholecalciferol (VITAMIN D3) 25 MCG (1000 UNIT) tablet Take 1,000 Units by mouth every other day. Take 5 tablets by mouth every other day     diphenhydramine-acetaminophen (TYLENOL PM) 25-500 MG TABS tablet Take 2 tablets by mouth at bedtime as needed (sleep/pain).     fluticasone furoate-vilanterol (  BREO ELLIPTA) 100-25 MCG/INH AEPB INHALE 1 PUFF EVERY DAY 180 each 0   hydrochlorothiazide (HYDRODIURIL) 25 MG tablet Take 0.5 tablets (12.5 mg total) by mouth daily. 45 tablet 3   irbesartan (AVAPRO) 75 MG tablet Take 1 tablet (75 mg total) by mouth 2 (two) times daily. 180 tablet 3   isosorbide mononitrate (IMDUR) 30 MG 24 hr tablet TAKE 1 TABLET TWICE DAILY (NEED MD APPOINTMENT) 180 tablet 3   Multiple Vitamin (MULTIVITAMIN PO) Take 1 tablet by mouth daily.     Multiple Vitamins-Minerals (PRESERVISION AREDS PO) Take by mouth daily.      rosuvastatin (CRESTOR) 20 MG tablet TAKE 1 TABLET EVERY DAY 90 tablet 0   No current facility-administered medications for this visit.   Allergies:  Norvasc [amlodipine besylate]   ROS: No palpitations.  No frank syncope.  Physical Exam: VS:  BP (!) 162/80   Pulse 70   Ht 5\' 10"  (1.778 m)   Wt 188 lb 6.4 oz (85.5 kg)   SpO2 98%   BMI 27.03 kg/m , BMI Body mass index is 27.03 kg/m.  Wt Readings from Last 3 Encounters:  05/31/21 188 lb 6.4 oz (85.5 kg)  05/03/21 190 lb 9.6 oz (86.5 kg)  04/30/21 190 lb 6.4 oz (86.4 kg)    General: Patient appears comfortable at rest. HEENT: Conjunctiva and lids normal, wearing a mask. Neck: Supple, no elevated JVP or carotid bruits, no thyromegaly. Lungs: Clear to auscultation, nonlabored breathing at rest. Cardiac: Regular rate and rhythm, no S3, 2/6 systolic murmur, no pericardial rub. Extremities: No pitting edema.  ECG:  An ECG dated 12/27/2020 was personally reviewed today and demonstrated:  Sinus rhythm with PACs and nonspecific ST-T abnormalities.  Recent Labwork: 04/30/2021: ALT 16; AST 19; BNP 225.3; BUN 10; Creatinine, Ser 0.98; Potassium 5.0; Sodium 143     Component Value Date/Time   CHOL 109 04/30/2021 1511   CHOL 102 12/25/2012 1251   TRIG 88 04/30/2021 1511   TRIG 82 12/25/2012 1251   HDL 42 04/30/2021 1511   HDL 36 (L) 12/25/2012 1251   CHOLHDL 2.6 04/30/2021 1511   LDLCALC 50 04/30/2021 1511   LDLCALC 50 12/25/2012 1251    Other Studies Reviewed Today:  Cardiac catheterization 05/19/2015: 1st Diag lesion, 50% stenosed. Prox LAD lesion, 20% stenosed. Prox Cx lesion, 30% stenosed. Mid Cx lesion, 20% stenosed. Dist Cx lesion, 20% stenosed. The left ventricular systolic function is normal.   Hyperdynamic LV function with an ejection fraction of 70-75% with evidence for left ventricular hypertrophy without focal segmental wall motion abnormalities.   No significant coronary obstructive disease with mild 20% luminal  narrowing in the proximal LAD, 50% ostial stenosis in a diminutive first diagonal branch; scattered smooth 30% proximal, 20% percent mid, and 20% distal left circumflex narrowing in a dominant left circumflex vessel and a small nondominant RCA.  Carotid Dopplers 05/08/2021: Summary:  Right Carotid: Velocities in the right ICA are consistent with a 40-59%                 stenosis. Non-hemodynamically significant plaque <50% noted  in                 the CCA. Stable RICA velocities.   Left Carotid: Velocities in the left ICA are consistent with a 40-59%  stenosis.                Hemodynamically significant plaque >50% visualized in the  CCA.  Stable LICA velocities.   Vertebrals:  Right vertebral artery demonstrates antegrade flow. Left  vertebral               artery demonstrates bidirectional flow.  Subclavians: Left subclavian artery was stenotic. Left subclavian artery  flow               was disturbed. Normal flow hemodynamics were seen in the  right               subclavian artery.   There is bi-directional flow noted in the left vertebral artery. The  right brachial artery is brisk and triphasic; the left is brisk and  biphasic.   Assessment and Plan:  1.  CAD with history of remote circumflex angioplasty and nonobstructive disease documented at cardiac catheterization in 2016.  He does not report any frank angina on medical therapy, has had some shortness of breath as discussed above.  He is currently on aspirin, Imdur, Coreg, Avapro, and Crestor.  We will obtain an echocardiogram to ensure no change in cardiac structure and function.  2.  Mixed hyperlipidemia, on Crestor.  Recent LDL 50.  3.  Essential hypertension with labile blood pressures and intermittent symptomatic orthostasis.  Component of autonomic dysfunction possible, however he is also in process of exclusion of pheochromocytoma per chart review.  Abdominal CT pending.  Noted to have elevated dopamine  levels and also aldosterone/renin ratio.  I did not change his medications today but did encourage him to try compression stockings at least for now.  Keep regular follow-up with Dr. Oval Linsey for further work-up.  Medication Adjustments/Labs and Tests Ordered: Current medicines are reviewed at length with the patient today.  Concerns regarding medicines are outlined above.   Tests Ordered: Orders Placed This Encounter  Procedures   ECHOCARDIOGRAM COMPLETE    Medication Changes: No orders of the defined types were placed in this encounter.   Disposition:  Follow up  1 year.  Signed, Satira Sark, MD, [facility] 05/31/2021 4:23 PM    [place] [facility] [place], [place], [place] [address] Phone: [phone]; Fax: [phone]

## 2021-05-31 NOTE — Patient Instructions (Addendum)
Medication Instructions:  Your physician recommends that you continue on your current medications as directed. Please refer to the Current Medication list given to you today.  Labwork: none  Testing/Procedures: Your physician has requested that you have an echocardiogram. Echocardiography is a painless test that uses sound waves to create images of your heart. It provides your doctor with information about the size and shape of your heart and how well your heart's chambers and valves are working. This procedure takes approximately one hour. There are no restrictions for this procedure.  Follow-Up: Your physician recommends that you schedule a follow-up appointment in: 1 year. You will receive a reminder call or letter in the mail in about 10 months reminding you to call and schedule your appointment. If you don't receive this letter, please contact our office.  Any Other Special Instructions Will Be Listed Below (If Applicable).  If you need a refill on your cardiac medications before your next appointment, please call your pharmacy.

## 2021-06-04 DIAGNOSIS — H353132 Nonexudative age-related macular degeneration, bilateral, intermediate dry stage: Secondary | ICD-10-CM | POA: Diagnosis not present

## 2021-06-04 DIAGNOSIS — H02831 Dermatochalasis of right upper eyelid: Secondary | ICD-10-CM | POA: Diagnosis not present

## 2021-06-04 DIAGNOSIS — Z961 Presence of intraocular lens: Secondary | ICD-10-CM | POA: Diagnosis not present

## 2021-06-04 DIAGNOSIS — I10 Essential (primary) hypertension: Secondary | ICD-10-CM | POA: Diagnosis not present

## 2021-06-11 ENCOUNTER — Telehealth: Payer: Self-pay | Admitting: Cardiovascular Disease

## 2021-06-11 NOTE — Telephone Encounter (Signed)
Spoke with patient and he received letter to pick up contrast but did not say where to pick up  Patient having procedure at [place] patient I would call [place] tomorrow and call him after I speak with them

## 2021-06-11 NOTE — Telephone Encounter (Signed)
Patient wants to know if his CT is with or without contrast. If it is with where does he get the two bottles of contrast.  Patient states he had a BMET on 6/14, he is wondering if he needs another this soon.

## 2021-06-12 ENCOUNTER — Other Ambulatory Visit (HOSPITAL_BASED_OUTPATIENT_CLINIC_OR_DEPARTMENT_OTHER): Payer: Self-pay | Admitting: *Deleted

## 2021-06-12 DIAGNOSIS — I10 Essential (primary) hypertension: Secondary | ICD-10-CM

## 2021-06-12 DIAGNOSIS — R7989 Other specified abnormal findings of blood chemistry: Secondary | ICD-10-CM

## 2021-06-12 NOTE — Telephone Encounter (Signed)
Spoke with radiologist and CT tech regarding upcoming study  Radiologist stated needed CT with contrast but was not sure which order  Spoke with tech who advised to order CT abdomen with and w/o but to put in comment adrenal protocol  Spoke with scheduling department who changed linked current order with scheduled appointment  Did advise patient where to pick up contrast

## 2021-06-18 ENCOUNTER — Other Ambulatory Visit: Payer: Self-pay

## 2021-06-18 ENCOUNTER — Other Ambulatory Visit: Payer: Medicare HMO

## 2021-06-18 DIAGNOSIS — Z01812 Encounter for preprocedural laboratory examination: Secondary | ICD-10-CM | POA: Diagnosis not present

## 2021-06-18 DIAGNOSIS — I10 Essential (primary) hypertension: Secondary | ICD-10-CM | POA: Diagnosis not present

## 2021-06-19 LAB — BASIC METABOLIC PANEL
BUN/Creatinine Ratio: 17 (ref 10–24)
BUN: 19 mg/dL (ref 8–27)
CO2: 28 mmol/L (ref 20–29)
Calcium: 9.2 mg/dL (ref 8.6–10.2)
Chloride: 103 mmol/L (ref 96–106)
Creatinine, Ser: 1.1 mg/dL (ref 0.76–1.27)
Glucose: 87 mg/dL (ref 70–99)
Potassium: 4.5 mmol/L (ref 3.5–5.2)
Sodium: 143 mmol/L (ref 134–144)
eGFR: 67 mL/min/{1.73_m2} (ref >59)

## 2021-06-20 ENCOUNTER — Telehealth: Payer: Self-pay

## 2021-06-20 ENCOUNTER — Other Ambulatory Visit: Payer: Self-pay | Admitting: Cardiology

## 2021-06-20 DIAGNOSIS — Z0000 Encounter for general adult medical examination without abnormal findings: Secondary | ICD-10-CM

## 2021-06-20 NOTE — Telephone Encounter (Signed)
Called patient to inform him that his Vivify bp monitoring has ended and that he needs to keep a written log of his blood pressures for his next appointment with Dr. Oval Linsey on 12/13. Patient was instructed to mail back his Go Monitor to [place]. Patient stated that he did not see a return address to send the device to.   Will reach out to [facility] to get the return address for the patient and will give him a call back. Patient verbalized understanding and is awaiting the call for further instructions.   Lenny Bouchillon Truman Hayward, [facility] Guide, Health Coach [address], [address] [place] [address] Telephone: [phone] Email: [email or web address]

## 2021-06-22 ENCOUNTER — Telehealth: Payer: Self-pay

## 2021-06-22 NOTE — Telephone Encounter (Signed)
Called patient to confirm that he is to return the Home Monitor back to [place] in the box that was provided with the return label. Have also emailed [place] that I have confirmed with my team that he is to send the device back and that the patient can proceed with the return. Patient verbalized understanding that he can now return the device.   Kylian Loh Truman Hayward, [facility] Guide, Health Coach [address] [place] [address] Telephone: [phone] Email: [email or web address]

## 2021-06-25 ENCOUNTER — Ambulatory Visit (HOSPITAL_COMMUNITY)
Admission: RE | Admit: 2021-06-25 | Discharge: 2021-06-25 | Disposition: A | Discharge status: Home / Self Care | Payer: Medicare HMO | Source: Ambulatory Visit | Attending: Cardiovascular Disease | Admitting: Cardiovascular Disease

## 2021-06-25 ENCOUNTER — Other Ambulatory Visit: Payer: Self-pay

## 2021-06-25 DIAGNOSIS — I701 Atherosclerosis of renal artery: Secondary | ICD-10-CM | POA: Diagnosis not present

## 2021-06-25 DIAGNOSIS — I700 Atherosclerosis of aorta: Secondary | ICD-10-CM | POA: Insufficient documentation

## 2021-06-25 DIAGNOSIS — R7989 Other specified abnormal findings of blood chemistry: Secondary | ICD-10-CM | POA: Diagnosis not present

## 2021-06-25 DIAGNOSIS — J439 Emphysema, unspecified: Secondary | ICD-10-CM | POA: Insufficient documentation

## 2021-06-25 DIAGNOSIS — I10 Essential (primary) hypertension: Secondary | ICD-10-CM | POA: Diagnosis not present

## 2021-06-25 DIAGNOSIS — N200 Calculus of kidney: Secondary | ICD-10-CM | POA: Insufficient documentation

## 2021-06-25 MED ORDER — IOHEXOL 300 MG/ML  SOLN
100.0000 mL | Freq: Once | INTRAMUSCULAR | Status: AC | PRN
  Administered 2021-06-25: 100 mL via INTRAVENOUS

## 2021-07-10 ENCOUNTER — Telehealth: Payer: Self-pay | Admitting: *Deleted

## 2021-07-10 ENCOUNTER — Ambulatory Visit (INDEPENDENT_AMBULATORY_CARE_PROVIDER_SITE_OTHER): Payer: Medicare HMO

## 2021-07-10 DIAGNOSIS — R0602 Shortness of breath: Secondary | ICD-10-CM

## 2021-07-10 LAB — ECHOCARDIOGRAM COMPLETE
AV Vena cont: 0.57 cm
Area-P 1/2: 2.99 cm2
Calc EF: 57.8 %
MV M vel: 5.35 m/s
MV Peak grad: 114.5 mmHg
P 1/2 time: 756 msec
S' Lateral: 3.47 cm
Single Plane A2C EF: 38.4 %
Single Plane A4C EF: 70.4 %

## 2021-07-10 NOTE — Telephone Encounter (Signed)
-----   Message from Satira Sark, MD sent at 07/10/2021 10:03 AM EST ----- Results reviewed.  LVEF remains normal range at 55 to 60%.  Would continue with current medications and follow-up plan.

## 2021-07-10 NOTE — Telephone Encounter (Signed)
Patient informed. Copy sent to PCP °

## 2021-07-18 ENCOUNTER — Telehealth: Payer: Self-pay | Admitting: Cardiovascular Disease

## 2021-07-18 DIAGNOSIS — I701 Atherosclerosis of renal artery: Secondary | ICD-10-CM

## 2021-07-18 NOTE — Telephone Encounter (Signed)
-----   Message from Skeet Latch, MD sent at 07/10/2021  3:30 PM EST ----- Study showed that there is some blockage in the arteries to both kidneys.  Recommend that he be seen by Dr. Gwenlyn Found or Dr. Fletcher Anon who specialize in better evaluating these arteries and helping to determine if it could be contributing to his elevated blood pressures.

## 2021-07-18 NOTE — Telephone Encounter (Signed)
Earvin Hansen, LPN  81/05/3158  4:58 PM EST Back to Top    Advised patient, verbalized understanding  Message to schedulers to arrange visit with Dr Gwenlyn Found or Dr Fletcher Anon

## 2021-07-18 NOTE — Telephone Encounter (Signed)
Patient returning call for CT results. 

## 2021-07-25 ENCOUNTER — Encounter: Payer: Self-pay | Admitting: Cardiovascular Disease

## 2021-07-25 ENCOUNTER — Ambulatory Visit: Payer: Medicare HMO | Admitting: Cardiovascular Disease

## 2021-07-25 ENCOUNTER — Other Ambulatory Visit: Payer: Self-pay

## 2021-07-25 VITALS — BP 162/68 | HR 89 | Ht 70.0 in | Wt 190.4 lb

## 2021-07-25 DIAGNOSIS — I10 Essential (primary) hypertension: Secondary | ICD-10-CM | POA: Diagnosis not present

## 2021-07-25 DIAGNOSIS — I25119 Atherosclerotic heart disease of native coronary artery with unspecified angina pectoris: Secondary | ICD-10-CM

## 2021-07-25 DIAGNOSIS — E782 Mixed hyperlipidemia: Secondary | ICD-10-CM

## 2021-07-25 NOTE — Patient Instructions (Signed)

## 2021-07-25 NOTE — Progress Notes (Signed)
07/25/2021 Hector Torres   September 13, 1940  [id number]  Primary Physician Janora Norlander, DO Primary Cardiologist: Lorretta Harp MD Lupe Carney, [place]  HPI:  Hector Torres is a 80 y.o. mildly overweight widowed Caucasian male father of 29, grandfather 6 grandchildren referred by Dr. Oval Linsey for evaluation of renal artery stenosis.  He is very tired Airline pilot.  His risk factors include long history tobacco abuse having smoked over 60 years and quit in 2012.Marland Kitchen  He does have a history of treated hypertension hyperlipidemia.  He had a TIA in the past but is never had a heart attack.  He has nonobstructive CAD.  He had renal Dopplers performed 01/04/2021 that showed no evidence of renal artery stenosis however abdominal CTA performed 06/25/2021 that suggested bilateral ostial renal artery stenosis suggesting that his hypertension may be secondary nature.   Current Meds  Medication Sig   aspirin 81 MG tablet Take 81 mg by mouth daily.   carvedilol (COREG) 6.25 MG tablet TAKE 1 TABLET TWICE DAILY WITH MEALS   cholecalciferol (VITAMIN D3) 25 MCG (1000 UNIT) tablet Take 1,000 Units by mouth every other day. Take 5 tablets by mouth every other day   diphenhydramine-acetaminophen (TYLENOL PM) 25-500 MG TABS tablet Take 2 tablets by mouth at bedtime as needed (sleep/pain).   fluticasone furoate-vilanterol (BREO ELLIPTA) 100-25 MCG/INH AEPB INHALE 1 PUFF EVERY DAY   hydrochlorothiazide (HYDRODIURIL) 25 MG tablet Take 0.5 tablets (12.5 mg total) by mouth daily.   irbesartan (AVAPRO) 75 MG tablet TAKE 1 TABLET TWICE DAILY   isosorbide mononitrate (IMDUR) 30 MG 24 hr tablet TAKE 1 TABLET TWICE DAILY (NEED MD APPOINTMENT)   Multiple Vitamin (MULTIVITAMIN PO) Take 1 tablet by mouth daily.   Multiple Vitamins-Minerals (PRESERVISION AREDS PO) Take by mouth daily.   rosuvastatin (CRESTOR) 20 MG tablet TAKE 1 TABLET EVERY DAY     Allergies  Allergen Reactions    Norvasc [Amlodipine Besylate] Swelling and Other (See Comments)    LE swelling    Social History   Socioeconomic History   Marital status: Widowed    Spouse name: Not on file   Number of children: Not on file   Years of education: Not on file   Highest education level: Not on file  Occupational History   Not on file  Tobacco Use   Smoking status: Former    Packs/day: 1.50    Years: 60.00    Pack years: 90.00    Types: Cigarettes    Quit date: 02/27/2011    Years since quitting: 10.4   Smokeless tobacco: Never  Vaping Use   Vaping Use: Never used  Substance and Sexual Activity   Alcohol use: No   Drug use: No   Sexual activity: Not on file  Other Topics Concern   Not on file  Social History Narrative   Not on file   Social Determinants of Health   Financial Resource Strain: Low Risk    Difficulty of Paying Living Expenses: Not hard at all  Food Insecurity: No Food Insecurity   Worried About Charity fundraiser in the Last Year: Never true   [place] in the Last Year: Never true  Transportation Needs: No Transportation Needs   Lack of Transportation (Medical): No   Lack of Transportation (Non-Medical): No  Physical Activity: Inactive   Days of Exercise per Week: 0 days   Minutes of Exercise per Session: 0 min  Stress: No Stress Concern Present   Feeling of Stress : Only a little  Social Connections: Not on file  Intimate Partner Violence: Not on file     Review of Systems: General: negative for chills, fever, night sweats or weight changes.  Cardiovascular: negative for chest pain, dyspnea on exertion, edema, orthopnea, palpitations, paroxysmal nocturnal dyspnea or shortness of breath Dermatological: negative for rash Respiratory: negative for cough or wheezing Urologic: negative for hematuria Abdominal: negative for nausea, vomiting, diarrhea, bright red blood per rectum, melena, or hematemesis Neurologic: negative for visual changes, syncope, or  dizziness All other systems reviewed and are otherwise negative except as noted above.    Blood pressure (!) 162/68, pulse 89, height 5\' 10"  (1.778 m), weight 190 lb 6.4 oz (86.4 kg), SpO2 96 %.  General appearance: alert and no distress Neck: no adenopathy, no carotid bruit, no JVD, supple, symmetrical, trachea midline, and thyroid not enlarged, symmetric, no tenderness/mass/nodules Lungs: clear to auscultation bilaterally Heart: regular rate and rhythm, S1, S2 normal, no murmur, click, rub or gallop Extremities: extremities normal, atraumatic, no cyanosis or edema Pulses: 2+ and symmetric Skin: Skin color, texture, turgor normal. No rashes or lesions Neurologic: Grossly normal  EKG sinus rhythm at 89 with septal Q waves and left axis deviation.  I personally reviewed this EKG.  ASSESSMENT AND PLAN:   Resistant hypertension [name] was referred to me by Dr. Oval Linsey for resistant hypertension.  He is on 3 antihypertensive medicines with blood pressure that is suboptimally managed.  He did have renal Doppler studies performed 01/04/2021 that did not show renal artery stenosis.  Subsequent abdominal CTA performed 06/25/2021 suggested bilateral renal artery stenosis.  He certainly may benefit from angiography and intervention.  He is scheduled to see Dr. Oval Linsey in the near future at which time they will discuss this and if the consensus is to proceed I am happy to perform.     Lorretta Harp MD FACP,FACC,FAHA, [facility] 07/25/2021 4:11 PM

## 2021-07-25 NOTE — Assessment & Plan Note (Signed)
Mr. Hector Torres was referred to me by Dr. Oval Linsey for resistant hypertension.  He is on 3 antihypertensive medicines with blood pressure that is suboptimally managed.  He did have renal Doppler studies performed 01/04/2021 that did not show renal artery stenosis.  Subsequent abdominal CTA performed 06/25/2021 suggested bilateral renal artery stenosis.  He certainly may benefit from angiography and intervention.  He is scheduled to see Dr. Oval Linsey in the near future at which time they will discuss this and if the consensus is to proceed I am happy to perform.

## 2021-07-30 ENCOUNTER — Other Ambulatory Visit: Payer: Self-pay

## 2021-07-30 ENCOUNTER — Encounter: Payer: Self-pay | Admitting: Family Medicine

## 2021-07-30 ENCOUNTER — Ambulatory Visit (INDEPENDENT_AMBULATORY_CARE_PROVIDER_SITE_OTHER): Payer: Medicare HMO | Admitting: Family Medicine

## 2021-07-30 VITALS — BP 153/83 | HR 49 | Temp 97.3°F | Ht 70.0 in | Wt 189.4 lb

## 2021-07-30 DIAGNOSIS — Z87891 Personal history of nicotine dependence: Secondary | ICD-10-CM | POA: Diagnosis not present

## 2021-07-30 DIAGNOSIS — I10 Essential (primary) hypertension: Secondary | ICD-10-CM

## 2021-07-30 DIAGNOSIS — R300 Dysuria: Secondary | ICD-10-CM | POA: Diagnosis not present

## 2021-07-30 DIAGNOSIS — Z23 Encounter for immunization: Secondary | ICD-10-CM

## 2021-07-30 DIAGNOSIS — R350 Frequency of micturition: Secondary | ICD-10-CM | POA: Diagnosis not present

## 2021-07-30 DIAGNOSIS — R0609 Other forms of dyspnea: Secondary | ICD-10-CM

## 2021-07-30 LAB — URINALYSIS, ROUTINE W REFLEX MICROSCOPIC
Bilirubin, UA: NEGATIVE
Glucose, UA: NEGATIVE
Ketones, UA: NEGATIVE
Nitrite, UA: NEGATIVE
Protein,UA: NEGATIVE
Specific Gravity, UA: 1.01 (ref 1.005–1.030)
Urobilinogen, Ur: 0.2 mg/dL (ref 0.2–1.0)
pH, UA: 7 (ref 5.0–7.5)

## 2021-07-30 LAB — MICROSCOPIC EXAMINATION
Epithelial Cells (non renal): NONE SEEN /hpf (ref 0–10)
Renal Epithel, UA: NONE SEEN /hpf

## 2021-07-30 MED ORDER — TRELEGY ELLIPTA 100-62.5-25 MCG/ACT IN AEPB: 1.0000 | INHALATION_SPRAY | Freq: Every day | RESPIRATORY_TRACT | 0 refills | Status: DC

## 2021-07-30 NOTE — Patient Instructions (Addendum)
Trial of trelegy.  Let me know which works better for you.  Referral to the lung specialist in [place] placed

## 2021-07-30 NOTE — Progress Notes (Signed)
Subjective: ZL:[id number] of breath PCP: Hector Norlander, DO Hector Torres is a 80 y.o. male presenting to clinic today for:  1. Shortness of breath Patient with ongoing shortness of breath with exertion.  This does not occur at rest.  He does notice some improvement with Breo but feels that he could benefit from a higher dose.  Would like to go ahead and see pulmonology if possible.  Has had cardiac work-up and shortness of breath not felt to be related to heart.  He is a former smoker of 60 years that quit in 2012.  Had chest x-ray performed a couple months ago that was negative.  No recent low-dose CT for lung cancer screening.  No hemoptysis, unplanned weight loss, night sweats  2.  Dysuria Patient reports mild dysuria with cloudy urine for about 1 week.  Denies any hematuria, fevers, nausea, vomiting.  He would like to get a urinalysis and PSA ordered and copy to Dr. Junious Silk, who he will be seeing in January  3.  Hypertension Patient reports compliance with his medications.  He brings a blood pressure log to me today which shows blood pressures generally in the 120s over 80s but he does have a couple of outliers with a blood pressures in the 160s over 90s.  ROS: Per HPI  Allergies  Allergen Reactions   Norvasc [Amlodipine Besylate] Swelling and Other (See Comments)    LE swelling   Past Medical History:  Diagnosis Date   Arthritis    BPH (benign prostatic hypertrophy)    Carotid artery disease (HCC)    Bilateral - Dr. Bridgett Larsson   Colon polyp    COPD (chronic obstructive pulmonary disease) ([place])    Coronary atherosclerosis of native coronary artery    PTCA circumflex at [place], nonobstructive residual disease at catheterization June and October 2016   Essential hypertension, benign    Possible white coat   GERD (gastroesophageal reflux disease)    History of nephrolithiasis    Mixed hyperlipidemia    Prostate nodule    PVD (peripheral vascular disease) (HCC)     TIA (transient ischemic attack)    Vitamin D deficiency     Current Outpatient Medications:    aspirin 81 MG tablet, Take 81 mg by mouth daily., Disp: , Rfl:    carvedilol (COREG) 6.25 MG tablet, TAKE 1 TABLET TWICE DAILY WITH MEALS, Disp: 180 tablet, Rfl: 0   cholecalciferol (VITAMIN D3) 25 MCG (1000 UNIT) tablet, Take 1,000 Units by mouth every other day. Take 5 tablets by mouth every other day, Disp: , Rfl:    diphenhydramine-acetaminophen (TYLENOL PM) 25-500 MG TABS tablet, Take 2 tablets by mouth at bedtime as needed (sleep/pain)., Disp: , Rfl:    fluticasone furoate-vilanterol (BREO ELLIPTA) 100-25 MCG/INH AEPB, INHALE 1 PUFF EVERY DAY, Disp: 180 each, Rfl: 0   hydrochlorothiazide (HYDRODIURIL) 25 MG tablet, Take 0.5 tablets (12.5 mg total) by mouth daily., Disp: 45 tablet, Rfl: 3   irbesartan (AVAPRO) 75 MG tablet, TAKE 1 TABLET TWICE DAILY, Disp: 180 tablet, Rfl: 3   isosorbide mononitrate (IMDUR) 30 MG 24 hr tablet, TAKE 1 TABLET TWICE DAILY (NEED MD APPOINTMENT), Disp: 180 tablet, Rfl: 3   Multiple Vitamin (MULTIVITAMIN PO), Take 1 tablet by mouth daily., Disp: , Rfl:    Multiple Vitamins-Minerals (PRESERVISION AREDS PO), Take by mouth daily., Disp: , Rfl:    rosuvastatin (CRESTOR) 20 MG tablet, TAKE 1 TABLET EVERY DAY, Disp: 90 tablet, Rfl: 0 Social History  Socioeconomic History   Marital status: Widowed    Spouse name: Not on file   Number of children: Not on file   Years of education: Not on file   Highest education level: Not on file  Occupational History   Not on file  Tobacco Use   Smoking status: Former    Packs/day: 1.50    Years: 60.00    Pack years: 90.00    Types: Cigarettes    Quit date: 02/27/2011    Years since quitting: 10.4   Smokeless tobacco: Never  Vaping Use   Vaping Use: Never used  Substance and Sexual Activity   Alcohol use: No   Drug use: No   Sexual activity: Not on file  Other Topics Concern   Not on file  Social History Narrative   Not  on file   Social Determinants of Health   Financial Resource Strain: Low Risk    Difficulty of Paying Living Expenses: Not hard at all  Food Insecurity: No Food Insecurity   Worried About Charity fundraiser in the Last Year: Never true   [place] in the Last Year: Never true  Transportation Needs: No Transportation Needs   Lack of Transportation (Medical): No   Lack of Transportation (Non-Medical): No  Physical Activity: Inactive   Days of Exercise per Week: 0 days   Minutes of Exercise per Session: 0 min  Stress: No Stress Concern Present   Feeling of Stress : Only a little  Social Connections: Not on file  Intimate Partner Violence: Not on file   Family History  Problem Relation Age of Onset   Gastric cancer Mother    Varicose Veins Mother    Cancer Mother        Stomach   Prostate cancer Father    Cancer Father        Prostate   Other Brother        carotid artery stenosis   Diabetes Brother    Leukemia Brother    Hyperlipidemia Brother    Hypertension Brother    Colon cancer Brother    Hyperlipidemia Brother    Peripheral vascular disease Brother    Cancer Brother        Colon and Prostate   Hypertension Sister    Hyperlipidemia Sister    Diabetes Daughter    Diabetes Son    Prostate cancer Brother     Objective: Office vital signs reviewed. BP (!) 176/72   Pulse (!) 49   Temp (!) 97.3 F (36.3 C)   Ht 5\' 10"  (1.778 m)   Wt 189 lb 6.4 oz (85.9 kg)   SpO2 95%   BMI 27.18 kg/m   Physical Examination:  General: Awake, alert, well nourished, No acute distress HEENT: Sclera white.  Moist mucous membranes Cardio: Bradycardic with regular rhythm, S1S2 heard, no murmurs appreciated Pulm: clear to auscultation bilaterally, no wheezes, rhonchi or rales; normal work of breathing on room air  Assessment/ Plan: 80 y.o. male   Resistant hypertension  Urinary frequency - Plan: Urinalysis, Routine w reflex microscopic, PSA, Urine Culture  Dyspnea on  exertion - Plan: Ambulatory referral to Pulmonology  Former smoker - Plan: Ambulatory referral to Pulmonology  Need for immunization against influenza - Plan: Flu Vaccine QUAD High Dose(Fluad)  Ongoing resistant hypertension.  He will be seeing the advance hypertension clinic soon.  Continue current regimen.  Urinalysis does show some small amounts of bacteria.  We will send this for  urine culture.  Plan to treat pending culture results.  PSA collected and will be CCed to Dr. Junious Silk upon resulting  I placed a referral to pulmonology for further evaluation of his ongoing dyspnea on exertion.  I have given him samples of Trelegy as we do not have any Breo in stock.  Would like to see if this impacts his breathing in a positive manner.  Influenza vaccination administered  Orders Placed This Encounter  Procedures   Urinalysis, Routine w reflex microscopic   No orders of the defined types were placed in this encounter.    Hector Norlander, DO [place] [phone]

## 2021-07-31 ENCOUNTER — Other Ambulatory Visit: Payer: Self-pay

## 2021-07-31 ENCOUNTER — Ambulatory Visit (HOSPITAL_BASED_OUTPATIENT_CLINIC_OR_DEPARTMENT_OTHER): Payer: Medicare HMO | Admitting: Cardiovascular Disease

## 2021-07-31 ENCOUNTER — Encounter (HOSPITAL_BASED_OUTPATIENT_CLINIC_OR_DEPARTMENT_OTHER): Payer: Self-pay | Admitting: *Deleted

## 2021-07-31 ENCOUNTER — Encounter (HOSPITAL_BASED_OUTPATIENT_CLINIC_OR_DEPARTMENT_OTHER): Payer: Self-pay | Admitting: Cardiovascular Disease

## 2021-07-31 VITALS — BP 131/77 | HR 94 | Ht 70.0 in | Wt 191.0 lb

## 2021-07-31 DIAGNOSIS — I10 Essential (primary) hypertension: Secondary | ICD-10-CM

## 2021-07-31 DIAGNOSIS — Z01812 Encounter for preprocedural laboratory examination: Secondary | ICD-10-CM | POA: Diagnosis not present

## 2021-07-31 DIAGNOSIS — I701 Atherosclerosis of renal artery: Secondary | ICD-10-CM | POA: Insufficient documentation

## 2021-07-31 LAB — PSA: Prostate Specific Ag, Serum: 1 ng/mL (ref 0.0–4.0)

## 2021-07-31 NOTE — Patient Instructions (Signed)
Medication Instructions:  Your physician recommends that you continue on your current medications as directed. Please refer to the Current Medication list given to you today.   Labwork: BMET/CBC 1 WEEK PRIOR TO PROCEDURE   Testing/Procedures: WILL ARRANGE FOR RENAL PROCEDURE WITH DR Gwenlyn Found AFTER FIRST OF YEAR   Follow-Up: 11/07/2021 DR Rossmoyne AT 1:45 PM  Any Other Special Instructions Will Be Listed Below (If Applicable).  WILL CALL YOU WITH DATE AND TIME OF YOUR PROCEDURE WITH DR Gwenlyn Found

## 2021-07-31 NOTE — Assessment & Plan Note (Addendum)
Overall his blood pressure is better but still very labile.  He goes from the 160s to the 60s in a short period of time.  He is not able to tolerate compression socks or an abdominal binder due to his severe COPD.  He cannot get them on and off or breathe once they are on.  For now, we will continue with carvedilol, HCTZ, irbesartan, and Imdur.  We did refer him to the [facility] syncope clinic but he was somehow referred to heart failure instead.  We will wait until we work-up the renal artery stenosis and if this is not helpful then consider referring to them.  Recent echo was unremarkable.  Labs were concerning for pheochromocytoma and possible hyperaldosteronism.  However there is no adenoma noted on abdominal CT.  Other work-up for secondary causes is otherwise unremarkable.

## 2021-07-31 NOTE — Assessment & Plan Note (Signed)
Moderate to severe renal artery stenosis bilaterally on CT.  Given his extremely labile and elevated BP with no other clear secondary causes, I think it makes sense to proceed with angiography.  He is in agreement.   Risks and benefits of renal artery catheterization have been discussed with the patient.  The patient understands that risks included but are not limited to stroke (1 in 1000), death (1 in 32), kidney failure [usually temporary] (1 in 500), bleeding (1 in 200), allergic reaction [possibly serious] (1 in 200). The patient understands and agrees to proceed.   Mitsuko Luera C. Oval Linsey, MD, [facility]  07/31/2021 6:15 PM

## 2021-07-31 NOTE — Progress Notes (Signed)
Advanced Hypertension Clinic Follow-up:    Date:  07/31/2021   ID:  Hector Torres, DOB Feb 17, 1941, MRN [id number]  PCP:  Janora Norlander, DO  Cardiologist:  Rozann Lesches, MD  Nephrologist:  Referring MD: Janora Norlander, DO   CC: Hypertension  History of Present Illness:    Hector Torres is a 80 y.o. male with a hx of carotid artery disease, nonobstructive CAD, essential hypertension (benign), COPD, nephrolithiasis, hyperlipidemia, GERD, PVD, TIA here for follow-up. He initially established care in the hypertension clinic 12/27/2020. He has struggled with labile hypertension for years, however lately it seems to be worse. It has ranged from the 17O, systolic to the 160V. He last saw his PCP 10/2020 and was referred to advanced hypertension clinic. He is a patient of Dr. Domenic Polite and was last seen 04/2020. At that time he reported some blood pressure fluctuation.  At his initial visit, he had poorly controlled blood pressure that was very labile. He was only eating one meal a day and was not adequately hydrated. We discussed increasing fluid intake and eating more regularly. Hydralazine was added. He had renal artery dopplers 12/2020 that were normal. He enrolled in the [facility] and continues to have labile blood pressures ranging from 90s/50s to 190s/90s. He has followed closely with our pharmacist and has often been advised to hold irbesartan or hydralazine for his low blood pressures. Irbesartan was reduced to 75 mg a day on 01/2021. Cortisol, renin, aldosterone, and catacholamines have been ordered but not yet completed.  At his last appointment he continues to note labile blood pressures.  Labs were notable for elevated dopamine as well as an abnormal ratio of brain and and aldosterone, though not classic for hyperaldosteronism.  He had an CT of the abdomen which revealed normal adrenal glands bilaterally.  He did have atherosclerosis in bilateral renal ostia with moderate to  severe stenosis bilaterally.  He saw Dr. Gwenlyn Found on 12/7 who is agreeable to proceeding with angiography if thought to be necessary.  We recommended and is being seen in the [facility] syncope clinic given his significant orthostasis.  Somehow he was accidentally scheduled to see Heart Failure.  Lately his main concern has been his breathing.  His COPD has been bothering him.  He saw his PCP yesterday and is getting a referral to pulmonology.  He is unable to get much exercise because of the COPD.  He denies lower extremity edema, orthopnea, or PND.  He did try wearing compression socks but did not notice much difference with them on.  He is unable to get them on because of his COPD.  This also makes it hard for him to wear an abdominal binder.  Past Medical History:  Diagnosis Date   Arthritis    BPH (benign prostatic hypertrophy)    Carotid artery disease (HCC)    Bilateral - Dr. Bridgett Larsson   Colon polyp    COPD (chronic obstructive pulmonary disease) ([place])    Coronary atherosclerosis of native coronary artery    [facility]CA circumflex at [facility], nonobstructive residual disease at catheterization June and October 2016   Essential hypertension, benign    Possible white coat   GERD (gastroesophageal reflux disease)    History of nephrolithiasis    Mixed hyperlipidemia    Prostate nodule    PVD (peripheral vascular disease) (HCC)    TIA (transient ischemic attack)    Vitamin D deficiency     Past Surgical History:  Procedure Laterality Date  ANGIOPLASTY Left 1995   Cir. Artery   APPENDECTOMY  1947   CARDIAC CATHETERIZATION N/A 02/03/2015   Procedure: Left Heart Cath and Cors/Grafts Angiography;  Surgeon: Sherren Mocha, MD;  Location: [facility];  Service: Cardiovascular;  Laterality: N/A;   CARDIAC CATHETERIZATION N/A 05/19/2015   Procedure: Left Heart Cath and Coronary Angiography;  Surgeon: Troy Sine, MD;  Location: [facility];  Service: Cardiovascular;  Laterality: N/A;    COLONOSCOPY N/A 03/19/2018   Procedure: COLONOSCOPY;  Surgeon: Danie Binder, MD;  [facility];  Service: Endoscopy;  Laterality: N/A;  12:00   EYE SURGERY     Bilateral cataract   POLYPECTOMY  03/19/2018   Procedure: POLYPECTOMY;  Surgeon: Danie Binder, MD;  Location: [facility];  Service: Endoscopy;;   TONSILLECTOMY  1968   Traumatic injury  1962   Lost tip of index, middle, and ring finger/ right hand    Current Medications: Current Meds  Medication Sig   aspirin 81 MG tablet Take 81 mg by mouth daily.   carvedilol (COREG) 6.25 MG tablet TAKE 1 TABLET TWICE DAILY WITH MEALS   cholecalciferol (VITAMIN D3) 25 MCG (1000 UNIT) tablet Take 1,000 Units by mouth every other day. Take 5 tablets by mouth every other day   diphenhydramine-acetaminophen (TYLENOL PM) 25-500 MG TABS tablet Take 2 tablets by mouth at bedtime as needed (sleep/pain).   Fluticasone-Umeclidin-Vilant (TRELEGY ELLIPTA) 100-62.5-25 MCG/ACT AEPB Inhale 1 puff into the lungs daily.   hydrochlorothiazide (HYDRODIURIL) 25 MG tablet Take 0.5 tablets (12.5 mg total) by mouth daily.   irbesartan (AVAPRO) 75 MG tablet TAKE 1 TABLET TWICE DAILY   isosorbide mononitrate (IMDUR) 30 MG 24 hr tablet TAKE 1 TABLET TWICE DAILY (NEED MD APPOINTMENT)   Multiple Vitamin (MULTIVITAMIN PO) Take 1 tablet by mouth daily.   Multiple Vitamins-Minerals (PRESERVISION AREDS PO) Take by mouth daily.   rosuvastatin (CRESTOR) 20 MG tablet TAKE 1 TABLET EVERY DAY     Allergies:   Norvasc [amlodipine besylate]   Social History   Socioeconomic History   Marital status: Widowed    Spouse name: Not on file   Number of children: Not on file   Years of education: Not on file   Highest education level: Not on file  Occupational History   Not on file  Tobacco Use   Smoking status: Former    Packs/day: 1.50    Years: 60.00    Pack years: 90.00    Types: Cigarettes    Quit date: 02/27/2011    Years since quitting: 10.4    Smokeless tobacco: Never  Vaping Use   Vaping Use: Never used  Substance and Sexual Activity   Alcohol use: No   Drug use: No   Sexual activity: Not on file  Other Topics Concern   Not on file  Social History Narrative   Not on file   Social Determinants of Health   Financial Resource Strain: Low Risk    Difficulty of Paying Living Expenses: Not hard at all  Food Insecurity: No Food Insecurity   Worried About Charity fundraiser in the Last Year: Never true   [place] in the Last Year: Never true  Transportation Needs: No Transportation Needs   Lack of Transportation (Medical): No   Lack of Transportation (Non-Medical): No  Physical Activity: Inactive   Days of Exercise per Week: 0 days   Minutes of Exercise per Session: 0 min  Stress:  No Stress Concern Present   Feeling of Stress : Only a little  Social Connections: Not on file     Family History: The patient's family history includes Cancer in his brother, father, and mother; Colon cancer in his brother; Diabetes in his brother, daughter, and son; Gastric cancer in his mother; Hyperlipidemia in his brother, brother, and sister; Hypertension in his brother and sister; Leukemia in his brother; Other in his brother; Peripheral vascular disease in his brother; Prostate cancer in his brother and father; Varicose Veins in his mother.  ROS:   Please see the history of present illness.    (+) Lightheadedness (+) Sharp, Central chest pain All other systems reviewed and are negative.  EKGs/Labs/Other Studies Reviewed:    Renal Artery Doppler 01/04/2021: Summary:  Largest Aortic Diameter: 2.0 cm     Renal:     Right: Normal size right kidney. Abnormal right Resistive Index.         Normal cortical thickness of right kidney. No evidence of         right renal artery stenosis. RRV flow present.  Left:  Normal size of left kidney. Abnormal left Resisitve Index.         Normal cortical thickness of the left kidney. No  evidence of         left renal artery stenosis. LRV flow present.  Mesenteric:  Normal Celiac artery and Superior Mesenteric artery findings.  Carotid Arterial Duplex Study 05/18/2020: Summary:  Right Carotid: Velocities in the right ICA are consistent with a 40-59%                 stenosis. The ECA appears >50% stenosed.   Left Carotid: Velocities in the left ICA are consistent with a 40-59%  stenosis.   Vertebrals:  Right vertebral artery demonstrates antegrade flow. Left  vertebral               artery demonstrates bidirectional flow.  Subclavians: Normal flow hemodynamics were seen in bilateral subclavian               arteries.   Left Heart Cath 05/19/2015: 1st Diag lesion, 50% stenosed. Prox LAD lesion, 20% stenosed. Prox Cx lesion, 30% stenosed. Mid Cx lesion, 20% stenosed. Dist Cx lesion, 20% stenosed. The left ventricular systolic function is normal.   Hyperdynamic LV function with an ejection fraction of 70-75% with evidence for left ventricular hypertrophy without focal segmental wall motion abnormalities.   No significant coronary obstructive disease with mild 20% luminal narrowing in the proximal LAD, 50% ostial stenosis in a diminutive first diagonal branch; scattered smooth 30% proximal, 20% percent mid, and 20% distal left circumflex narrowing in a dominant left circumflex vessel and a small nondominant RCA.   RECOMMENDATION: Medical therapy.  CT Angio Chest 05/18/2015: IMPRESSION: 1. No CT findings for pulmonary embolism. 2. Atherosclerotic calcifications involving the thoracic aorta but no aneurysm or dissection. 3. Emphysematous changes but no acute pulmonary findings. 4. Coronary artery calcifications.  EKG:   05/03/2021: EKG is not ordered today. 12/27/2020: NSR, rate 67, PACs, cannot rule-out prior anterior MI  Recent Labs: 04/30/2021: ALT 16; BNP 225.3 06/18/2021: BUN 19; Creatinine, Ser 1.10; Potassium 4.5; Sodium 143   Recent Lipid Panel     Component Value Date/Time   CHOL 109 04/30/2021 1511   CHOL 102 12/25/2012 1251   TRIG 88 04/30/2021 1511   TRIG 82 12/25/2012 1251   HDL 42 04/30/2021 1511   HDL 36 (L) 12/25/2012 1251  CHOLHDL 2.6 04/30/2021 1511   LDLCALC 50 04/30/2021 1511   LDLCALC 50 12/25/2012 1251    Physical Exam:    VS:  BP 131/77 (BP Location: Left Arm, Patient Position: Sitting, Cuff Size: Normal)    Pulse 94    Ht 5\' 10"  (1.778 m)    Wt 191 lb (86.6 kg)    SpO2 96%    BMI 27.41 kg/m  , BMI Body mass index is 27.41 kg/m. GENERAL:  Well appearing HEENT: Pupils equal round and reactive, fundi not visualized, oral mucosa unremarkable NECK:  No jugular venous distention, waveform within normal limits, carotid upstroke brisk and symmetric, no bruits LUNGS: Clear to auscultation bilaterally HEART:  RRR.  PMI not displaced or sustained,S1 and S2 within normal limits, no S3, no S4, no clicks, no rubs, no murmurs ABD:  Flat, positive bowel sounds normal in frequency in pitch, no bruits, no rebound, no guarding, no midline pulsatile mass, no hepatomegaly, no splenomegaly EXT:  2 plus pulses throughout, no edema, no cyanosis no clubbing SKIN:  No rashes no nodules NEURO:  Cranial nerves II through XII grossly intact, motor grossly intact throughout PSYCH:  Cognitively intact, oriented to person place and time  ASSESSMENT/PLAN:    Resistant hypertension Overall his blood pressure is better but still very labile.  He goes from the 160s to the 60s in a short period of time.  He is not able to tolerate compression socks or an abdominal binder due to his severe COPD.  He cannot get them on and off or breathe once they are on.  For now, we will continue with carvedilol, HCTZ, irbesartan, and Imdur.  We did refer him to the [facility] syncope clinic but he was somehow referred to heart failure instead.  We will wait until we work-up the renal artery stenosis and if this is not helpful then consider referring to them.  Recent  echo was unremarkable.  Labs were concerning for pheochromocytoma and possible hyperaldosteronism.  However there is no adenoma noted on abdominal CT.  Other work-up for secondary causes is otherwise unremarkable.  Renal artery stenosis (HCC) Moderate to severe renal artery stenosis bilaterally on CT.  Given his extremely labile and elevated BP with no other clear secondary causes, I think it makes sense to proceed with angiography.  He is in agreement.   Risks and benefits of renal artery catheterization have been discussed with the patient.  The patient understands that risks included but are not limited to stroke (1 in 1000), death (1 in 8), kidney failure [usually temporary] (1 in 500), bleeding (1 in 200), allergic reaction [possibly serious] (1 in 200). The patient understands and agrees to proceed.   Arianie Couse C. Oval Linsey, MD, [facility]  07/31/2021 6:15 PM    Screening for Secondary Hypertension:  Causes 12/27/2020 05/03/2021  Drugs/Herbals Screened Screened     - Comments High caffeine intake He is reducing caffeine intake  Renovascular HTN Screened Screened     - Comments Check renal artery Dopplers Renal Dopplers negative 12/2020  Sleep Apnea Screened N/A     - Comments No snoring or apnea no symptoms  Hyperthyroidism Screened -  Thyroid Disease Screened Screened  Hyperaldosteronism Screened Screened     - Comments Check renin and aldosterone check renin and aldosterone  Pheochromocytoma Screened Screened     - Comments Check catecholamines and metanephrines check catecholamines and metanephrines  Cushing's Syndrome Screened Screened     - Comments Check cortisol check cortisol  Hyperparathyroidism N/A Screened     -  Comments - calcium levels normal  Coarctation of the Aorta Screened (No Data)     - Comments Blood pressure symmetric L arm bp was significantly higher.  Check carotid Doppler to assess for subclavian stenosis  Compliance Screened Screened    Relevant  Labs/Studies: Basic Labs Latest Ref Rng & Units 06/18/2021 04/30/2021 01/30/2021  Sodium 134 - 144 mmol/L 143 143 144  Potassium 3.5 - 5.2 mmol/L 4.5 5.0 3.9  Creatinine 0.76 - 1.27 mg/dL 1.10 0.98 1.17    Thyroid  Latest Ref Rng & Units 09/10/2019 08/01/2017  TSH 0.450 - 4.500 uIU/mL 1.610 1.830    Renin/Aldosterone  Latest Ref Rng & Units 05/07/2021  Aldosterone 0.0 - 30.0 ng/dL 7.9  Renin 0.167 - 5.380 ng/mL/hr <0.167(L)  Aldos/Renin Ratio 0.0 - 30.0 >47.3(H)    Metanephrines/Catecholamines  Latest Ref Rng & Units 05/07/2021  Epinephrine 0 - 62 pg/mL 64(H)  Norepinephrine 0 - 874 pg/mL 519  Dopamine 0 - 48 pg/mL 193(H)  Metanephrines 0.0 - 88.0 pg/mL 38.1  Normetanephrines  0.0 - 297.2 pg/mL 91.2       Renovascular  01/04/2021  Renal Artery [place] Completed Yes    Disposition:    FU with Penne Rosenstock C. Oval Linsey, MD, [facility] in 3 months.  Medication Adjustments/Labs and Tests Ordered: Current medicines are reviewed at length with the patient today.  Concerns regarding medicines are outlined above.  Orders Placed This Encounter  Procedures   CBC with Differential/Platelet   Basic metabolic panel     No orders of the defined types were placed in this encounter.   Signed, Skeet Latch, MD  07/31/2021 6:15 PM    [facility]

## 2021-08-02 ENCOUNTER — Encounter (HOSPITAL_BASED_OUTPATIENT_CLINIC_OR_DEPARTMENT_OTHER): Payer: Self-pay | Admitting: *Deleted

## 2021-08-03 ENCOUNTER — Other Ambulatory Visit: Payer: Self-pay

## 2021-08-03 DIAGNOSIS — I701 Atherosclerosis of renal artery: Secondary | ICD-10-CM

## 2021-08-03 MED ORDER — SODIUM CHLORIDE 0.9% FLUSH: 3.0000 mL | Freq: Two times a day (BID) | INTRAVENOUS | Status: DC

## 2021-08-07 ENCOUNTER — Encounter: Payer: Self-pay | Admitting: Pulmonary Disease

## 2021-08-07 ENCOUNTER — Other Ambulatory Visit: Payer: Self-pay

## 2021-08-07 ENCOUNTER — Ambulatory Visit: Payer: Medicare HMO | Admitting: Pulmonary Disease

## 2021-08-07 VITALS — BP 144/78 | HR 60

## 2021-08-07 DIAGNOSIS — J432 Centrilobular emphysema: Secondary | ICD-10-CM | POA: Diagnosis not present

## 2021-08-07 DIAGNOSIS — J441 Chronic obstructive pulmonary disease with (acute) exacerbation: Secondary | ICD-10-CM | POA: Diagnosis not present

## 2021-08-07 MED ORDER — ALBUTEROL SULFATE HFA 108 (90 BASE) MCG/ACT IN AERS: 2.0000 | INHALATION_SPRAY | Freq: Four times a day (QID) | RESPIRATORY_TRACT | 6 refills | Status: AC | PRN

## 2021-08-07 NOTE — Assessment & Plan Note (Signed)
We will obtain PFTs to quantitate lung function. He has been escalated to triple therapy and we will provide him prescription for Trelegy 100.  He prefers for the prescription to be sent in January to his mail order pharmacy We will also provide him albuterol MDI for rescue  We discussed signs and symptoms of COPD exacerbation and treatment plan and he will call

## 2021-08-07 NOTE — Patient Instructions (Signed)
X schedule pFTs  X Rx for trelegy 100 in Jan 1 to mail order  xRx for albuterol MDI 2 puffs every 6h as needed

## 2021-08-07 NOTE — Progress Notes (Signed)
Subjective:    Patient ID: Hector Torres, male    DOB: 11-13-1940, 80 y.o.   MRN: [id number]  HPI  Chief Complaint  Patient presents with   Consult    Consult for dyspnea on exertion. Has been going on for 6 weeks now.     80 year old ex-smoker presents to establish care for COPD He smoked more than 90 pack years until he quit in 2012 around the time he was diagnosed with COPD.  He reports dyspnea on exertion especially after walking a few yards which is increased over the last 6 months.  This also occurs at night.  He reports vibration feeling in his left chest and burning in his chest.  He saw cardiology, EKG 12/7 showed normal sinus rhythm and septal infarct, echo showed normal LV function.  Over the years he has changed from [place] to [place] and about 2 weeks ago he was given samples of Trelegy by his PCP this seems to be working well but he is concerned about his co-pay He reports nonproductive cough, denies wheezing He has not had recent ED visits or hospitalizations this year  Chest x-ray 04/2021 independently reviewed shows hyperinflation, no infiltrates  PMH -renal artery stenosis, CAD -PTCA to left circumflex 1995   Significant tests/ events reviewed CTA angiogram chest 04/2015 emphysema  CT abdomen 06/2021 minimal right lower lobe scarring  Past Medical History:  Diagnosis Date   Arthritis    BPH (benign prostatic hypertrophy)    Carotid artery disease ([place])    Bilateral - Dr. Bridgett Larsson   Colon polyp    COPD (chronic obstructive pulmonary disease) ([place])    Coronary atherosclerosis of native coronary artery    PTCA circumflex at [facility], nonobstructive residual disease at catheterization June and October 2016   Essential hypertension, benign    Possible white coat   GERD (gastroesophageal reflux disease)    History of nephrolithiasis    Mixed hyperlipidemia    Prostate nodule    PVD (peripheral vascular disease) ([place])    TIA (transient ischemic attack)    Vitamin D  deficiency    Past Surgical History:  Procedure Laterality Date   ANGIOPLASTY Left 1995   Cir. Artery   APPENDECTOMY  1947   CARDIAC CATHETERIZATION N/A 02/03/2015   Procedure: Left Heart Cath and Cors/Grafts Angiography;  Surgeon: Sherren Mocha, MD;  Location: [place] [facility];  Service: Cardiovascular;  Laterality: N/A;   CARDIAC CATHETERIZATION N/A 05/19/2015   Procedure: Left Heart Cath and Coronary Angiography;  Surgeon: Troy Sine, MD;  Location: [place] [facility];  Service: Cardiovascular;  Laterality: N/A;   COLONOSCOPY N/A 03/19/2018   Procedure: COLONOSCOPY;  Surgeon: Danie Binder, MD;  Location[facility];  Service: Endoscopy;  Laterality: N/A;  12:00   EYE SURGERY     Bilateral cataract   POLYPECTOMY  03/19/2018   Procedure: POLYPECTOMY;  Surgeon: Danie Binder, MD;  Location: [facility];  Service: Endoscopy;;   TONSILLECTOMY  1968   Traumatic injury  1962   Lost tip of index, middle, and ring finger/ right hand    Allergies  Allergen Reactions   Norvasc [Amlodipine Besylate] Swelling and Other (See Comments)    LE swelling    Social History   Socioeconomic History   Marital status: Widowed    Spouse name: Not on file   Number of children: Not on file   Years of education: Not on file   Highest education level: Not  on file  Occupational History   Not on file  Tobacco Use   Smoking status: Former    Packs/day: 1.50    Years: 60.00    Pack years: 90.00    Types: Cigarettes    Quit date: 02/27/2011    Years since quitting: 10.4   Smokeless tobacco: Never  Vaping Use   Vaping Use: Never used  Substance and Sexual Activity   Alcohol use: No   Drug use: No   Sexual activity: Not on file  Other Topics Concern   Not on file  Social History Narrative   Not on file   Social Determinants of Health   Financial Resource Strain: Low Risk    Difficulty of Paying Living Expenses: Not hard at all  Food Insecurity: No Food Insecurity   Worried  About Charity fundraiser in the Last Year: Never true   [place] in the Last Year: Never true  Transportation Needs: No Transportation Needs   Lack of Transportation (Medical): No   Lack of Transportation (Non-Medical): No  Physical Activity: Inactive   Days of Exercise per Week: 0 days   Minutes of Exercise per Session: 0 min  Stress: No Stress Concern Present   Feeling of Stress : Only a little  Social Connections: Not on file  Intimate Partner Violence: Not on file     Family History  Problem Relation Age of Onset   Gastric cancer Mother    Varicose Veins Mother    Cancer Mother        Stomach   Prostate cancer Father    Cancer Father        Prostate   Other Brother        carotid artery stenosis   Diabetes Brother    Leukemia Brother    Hyperlipidemia Brother    Hypertension Brother    Colon cancer Brother    Hyperlipidemia Brother    Peripheral vascular disease Brother    Cancer Brother        Colon and Prostate   Hypertension Sister    Hyperlipidemia Sister    Diabetes Daughter    Diabetes Son    Prostate cancer Brother       Review of Systems Shortness of breath with activity Nonproductive cough Chest pain Tooth problems Sneezing Joint stiffness  Constitutional: negative for anorexia, fevers and sweats  Eyes: negative for irritation, redness and visual disturbance  Ears, nose, mouth, throat, and face: negative for earaches, epistaxis, nasal congestion and sore throat  Respiratory: negative for sputum and wheezing  Cardiovascular: negative for chest pain,  lower extremity edema, orthopnea, palpitations and syncope  Gastrointestinal: negative for abdominal pain, constipation, diarrhea, melena, nausea and vomiting  Genitourinary:negative for dysuria, frequency and hematuria  Hematologic/lymphatic: negative for bleeding, easy bruising and lymphadenopathy  Musculoskeletal:negative for arthralgias, muscle weakness and stiff joints  Neurological:  negative for coordination problems, gait problems, headaches and weakness  Endocrine: negative for diabetic symptoms including polydipsia, polyuria and weight loss     Objective:   Physical Exam  Gen. Pleasant, elderly,well-nourished, in no distress, normal affect ENT - no pallor,icterus, no post nasal drip Neck: No JVD, no thyromegaly, no carotid bruits Lungs: no use of accessory muscles, no dullness to percussion, decreased bilateral without rales or rhonchi  Cardiovascular: Rhythm regular, heart sounds  normal, no murmurs or gallops, no peripheral edema Abdomen: soft and non-tender, no hepatosplenomegaly, BS normal. Musculoskeletal: No deformities, no cyanosis or clubbing Neuro:  alert,  non focal       Assessment & Plan:    Dyspnea on exertion -seems to have worsened over the past 6 months.  No secondary causes identified today.  There is no evidence of pulm hypertension on echo.  Recent echo shows normal LVEF.  He underwent recent ischemia evaluation.  There is certainly an element of deconditioning and he would benefit from pulmonary rehab program.  He lives in [place] and would prefer a home-based program, advised him about the program at [facility]

## 2021-08-08 LAB — URINE CULTURE

## 2021-08-14 ENCOUNTER — Other Ambulatory Visit: Payer: Self-pay | Admitting: Family Medicine

## 2021-08-14 DIAGNOSIS — N3000 Acute cystitis without hematuria: Secondary | ICD-10-CM

## 2021-08-14 MED ORDER — CIPROFLOXACIN HCL 500 MG PO TABS: 500.0000 mg | ORAL_TABLET | Freq: Two times a day (BID) | ORAL | 0 refills | Status: AC

## 2021-08-21 ENCOUNTER — Telehealth: Payer: Self-pay

## 2021-08-21 ENCOUNTER — Other Ambulatory Visit: Payer: Medicare HMO

## 2021-08-21 DIAGNOSIS — I701 Atherosclerosis of renal artery: Secondary | ICD-10-CM

## 2021-08-21 DIAGNOSIS — I10 Essential (primary) hypertension: Secondary | ICD-10-CM | POA: Diagnosis not present

## 2021-08-21 DIAGNOSIS — Z01812 Encounter for preprocedural laboratory examination: Secondary | ICD-10-CM | POA: Diagnosis not present

## 2021-08-21 LAB — CBC WITH DIFFERENTIAL/PLATELET
Basophils Absolute: 0 10*3/uL (ref 0.0–0.2)
Basos: 1 %
EOS (ABSOLUTE): 0.3 10*3/uL (ref 0.0–0.4)
Eos: 5 %
Hematocrit: 50.1 % (ref 37.5–51.0)
Hemoglobin: 17.2 g/dL (ref 13.0–17.7)
Immature Grans (Abs): 0 10*3/uL (ref 0.0–0.1)
Immature Granulocytes: 0 %
Lymphocytes Absolute: 1.2 10*3/uL (ref 0.7–3.1)
Lymphs: 18 %
MCH: 33.2 pg — ABNORMAL HIGH (ref 26.6–33.0)
MCHC: 34.3 g/dL (ref 31.5–35.7)
MCV: 97 fL (ref 79–97)
Monocytes Absolute: 0.7 10*3/uL (ref 0.1–0.9)
Monocytes: 10 %
Neutrophils Absolute: 4.3 10*3/uL (ref 1.4–7.0)
Neutrophils: 66 %
Platelets: 221 10*3/uL (ref 150–450)
RBC: 5.18 x10E6/uL (ref 4.14–5.80)
RDW: 11.9 % (ref 11.6–15.4)
WBC: 6.5 10*3/uL (ref 3.4–10.8)

## 2021-08-21 LAB — BASIC METABOLIC PANEL
BUN/Creatinine Ratio: 15 (ref 10–24)
BUN: 18 mg/dL (ref 8–27)
CO2: 30 mmol/L — ABNORMAL HIGH (ref 20–29)
Calcium: 9.6 mg/dL (ref 8.6–10.2)
Chloride: 101 mmol/L (ref 96–106)
Creatinine, Ser: 1.19 mg/dL (ref 0.76–1.27)
Glucose: 84 mg/dL (ref 70–99)
Potassium: 4.3 mmol/L (ref 3.5–5.2)
Sodium: 141 mmol/L (ref 134–144)
eGFR: 61 mL/min/{1.73_m2} (ref >59)

## 2021-08-22 NOTE — Telephone Encounter (Signed)
Spoke with pt regarding follow up renal ultrasound and office visit with Dr. Gwenlyn Found. Appointment made. Orders for renal ultrasound placed. All questions answered. Pt verbalizes understanding.

## 2021-08-23 ENCOUNTER — Telehealth: Payer: Self-pay | Admitting: Pulmonary Disease

## 2021-08-23 ENCOUNTER — Telehealth: Payer: Self-pay | Admitting: *Deleted

## 2021-08-23 DIAGNOSIS — Z87891 Personal history of nicotine dependence: Secondary | ICD-10-CM

## 2021-08-23 DIAGNOSIS — R0609 Other forms of dyspnea: Secondary | ICD-10-CM

## 2021-08-23 MED ORDER — TRELEGY ELLIPTA 100-62.5-25 MCG/ACT IN AEPB: 1.0000 | INHALATION_SPRAY | Freq: Every day | RESPIRATORY_TRACT | 3 refills | Status: DC

## 2021-08-23 NOTE — Telephone Encounter (Signed)
Abdominal aortogram scheduled at [facility] for: Monday August 27, 2021 7:30 [facility] Main Entrance [facility]) at: 5:30 AM   Diet-no solid food after midnight prior to cath, clear liquids until 5 AM day of procedure.  Medication instructions for procedure: -Hold:  HCTZ-AM of procedure -Except hold medications usual morning medications can be taken pre-cath with sips of water including aspirin 81 mg.    Confirmed patient has responsible adult to drive home post procedure and be with patient first 24 hours after arriving home.  [facility] does allow one visitor to accompany you and wait in the hospital waiting room while you are there for your procedure. You and your visitor will be asked to wear a mask once you enter the hospital.   Patient reports does not currently have any new symptoms concerning for COVID-19 and no household members with COVID-19 like illness.    Reviewed procedure/mask/visitor instructions with patient.

## 2021-08-23 NOTE — Telephone Encounter (Signed)
Rx for pt's Trelegy inhaler has been sent to preferred pharmacy for pt and he verbalized understanding. Pt had a question about sodium chloride flush which he saw on AVS/mychart. Stated to him that this was authorized by Dr. Quay Burow so he should check with their office about this and he verbalized understanding. Nothing further needed.

## 2021-08-24 ENCOUNTER — Other Ambulatory Visit: Payer: Self-pay | Admitting: Cardiology

## 2021-08-24 NOTE — Addendum Note (Signed)
Addended by: Beatrix Fetters on: 08/24/2021 04:20 PM   Modules accepted: Orders

## 2021-08-27 ENCOUNTER — Other Ambulatory Visit: Payer: Self-pay

## 2021-08-27 ENCOUNTER — Encounter (HOSPITAL_COMMUNITY)
Admission: RE | Disposition: A | Discharge status: Home / Self Care | Payer: Medicare HMO | Source: Home / Self Care | Attending: Cardiovascular Disease

## 2021-08-27 ENCOUNTER — Encounter (HOSPITAL_COMMUNITY): Payer: Self-pay | Admitting: Cardiovascular Disease

## 2021-08-27 ENCOUNTER — Ambulatory Visit (HOSPITAL_COMMUNITY)
Admission: RE | Admit: 2021-08-27 | Discharge: 2021-08-27 | Disposition: A | Discharge status: Home / Self Care | Payer: Medicare HMO | Attending: Cardiovascular Disease | Admitting: Cardiovascular Disease

## 2021-08-27 DIAGNOSIS — I2510 Atherosclerotic heart disease of native coronary artery without angina pectoris: Secondary | ICD-10-CM | POA: Diagnosis not present

## 2021-08-27 DIAGNOSIS — I701 Atherosclerosis of renal artery: Secondary | ICD-10-CM | POA: Diagnosis not present

## 2021-08-27 DIAGNOSIS — I10 Essential (primary) hypertension: Secondary | ICD-10-CM | POA: Diagnosis not present

## 2021-08-27 DIAGNOSIS — Z8673 Personal history of transient ischemic attack (TIA), and cerebral infarction without residual deficits: Secondary | ICD-10-CM | POA: Insufficient documentation

## 2021-08-27 DIAGNOSIS — E785 Hyperlipidemia, unspecified: Secondary | ICD-10-CM | POA: Insufficient documentation

## 2021-08-27 DIAGNOSIS — Z87891 Personal history of nicotine dependence: Secondary | ICD-10-CM | POA: Insufficient documentation

## 2021-08-27 HISTORY — PX: ABDOMINAL AORTOGRAM W/LOWER EXTREMITY: CATH118223

## 2021-08-27 SURGERY — ABDOMINAL AORTOGRAM W/LOWER EXTREMITY
Anesthesia: LOCAL

## 2021-08-27 MED ORDER — HEPARIN (PORCINE) IN NACL 2000-0.9 UNIT/L-% IV SOLN
INTRAVENOUS | Status: AC
  Filled 2021-08-27: qty 1000

## 2021-08-27 MED ORDER — ASPIRIN 81 MG PO CHEW: 81.0000 mg | CHEWABLE_TABLET | ORAL | Status: DC

## 2021-08-27 MED ORDER — ASPIRIN EC 81 MG PO TBEC: 81.0000 mg | DELAYED_RELEASE_TABLET | Freq: Every day | ORAL | Status: DC

## 2021-08-27 MED ORDER — MORPHINE SULFATE (PF) 2 MG/ML IV SOLN: 2.0000 mg | INTRAVENOUS | Status: DC | PRN

## 2021-08-27 MED ORDER — HEPARIN (PORCINE) IN NACL 2000-0.9 UNIT/L-% IV SOLN
INTRAVENOUS | Status: DC | PRN
  Administered 2021-08-27: 1000 mL

## 2021-08-27 MED ORDER — SODIUM CHLORIDE 0.9 % IV SOLN: 250.0000 mL | INTRAVENOUS | Status: DC | PRN

## 2021-08-27 MED ORDER — SODIUM CHLORIDE 0.9% FLUSH: 3.0000 mL | Freq: Two times a day (BID) | INTRAVENOUS | Status: DC

## 2021-08-27 MED ORDER — ONDANSETRON HCL 4 MG/2ML IJ SOLN: 4.0000 mg | Freq: Four times a day (QID) | INTRAMUSCULAR | Status: DC | PRN

## 2021-08-27 MED ORDER — HYDRALAZINE HCL 20 MG/ML IJ SOLN: 5.0000 mg | INTRAMUSCULAR | Status: DC | PRN

## 2021-08-27 MED ORDER — SODIUM CHLORIDE 0.9% FLUSH: 3.0000 mL | INTRAVENOUS | Status: DC | PRN

## 2021-08-27 MED ORDER — SODIUM CHLORIDE 0.9 % WEIGHT BASED INFUSION: 1.0000 mL/kg/h | INTRAVENOUS | Status: DC

## 2021-08-27 MED ORDER — SODIUM CHLORIDE 0.9 % IV SOLN: INTRAVENOUS | Status: DC

## 2021-08-27 MED ORDER — LIDOCAINE HCL (PF) 1 % IJ SOLN
INTRAMUSCULAR | Status: AC
  Filled 2021-08-27: qty 30

## 2021-08-27 MED ORDER — LIDOCAINE HCL (PF) 1 % IJ SOLN
INTRAMUSCULAR | Status: DC | PRN
  Administered 2021-08-27: 20 mL

## 2021-08-27 MED ORDER — ACETAMINOPHEN 325 MG PO TABS: 650.0000 mg | ORAL_TABLET | ORAL | Status: DC | PRN

## 2021-08-27 MED ORDER — LABETALOL HCL 5 MG/ML IV SOLN
10.0000 mg | INTRAVENOUS | Status: DC | PRN
  Administered 2021-08-27: 10 mg via INTRAVENOUS
  Filled 2021-08-27: qty 4

## 2021-08-27 MED ORDER — IODIXANOL 320 MG/ML IV SOLN
INTRAVENOUS | Status: DC | PRN
  Administered 2021-08-27: 50 mL via INTRA_ARTERIAL

## 2021-08-27 MED ORDER — SODIUM CHLORIDE 0.9 % WEIGHT BASED INFUSION
3.0000 mL/kg/h | INTRAVENOUS | Status: AC
  Administered 2021-08-27: 3 mL/kg/h via INTRAVENOUS

## 2021-08-27 SURGICAL SUPPLY — 13 items
CATH ANGIO 5F PIGTAIL 65CM (CATHETERS) ×1 IMPLANT
CATH CROSS OVER TEMPO 5F (CATHETERS) ×1 IMPLANT
CLOSURE MYNX CONTROL 6F/7F (Vascular Products) ×1 IMPLANT
KIT PV (KITS) ×2 IMPLANT
SHEATH PINNACLE 6F 10CM (SHEATH) ×1 IMPLANT
SHEATH PROBE COVER 6X72 (BAG) ×1 IMPLANT
STOPCOCK MORSE 400PSI 3WAY (MISCELLANEOUS) ×1 IMPLANT
SYR MEDRAD MARK 7 150ML (SYRINGE) ×2 IMPLANT
TRANSDUCER W/STOPCOCK (MISCELLANEOUS) ×2 IMPLANT
TRAY PV CATH (CUSTOM PROCEDURE TRAY) ×2 IMPLANT
TUBING CIL FLEX 10 FLL-RA (TUBING) ×1 IMPLANT
WIRE HITORQ VERSACORE ST 145CM (WIRE) ×1 IMPLANT
WIRE MICRO SET SILHO 5FR 7 (SHEATH) ×1 IMPLANT

## 2021-08-27 NOTE — Progress Notes (Signed)
Up and walked and tolerated well; right groin stable, no bleeding or hematoma 

## 2021-08-27 NOTE — Interval H&P Note (Signed)
History and Physical Interval Note:  08/27/2021 7:27 AM  Hector Torres  has presented today for surgery, with the diagnosis of renal artery stenosis.  The various methods of treatment have been discussed with the patient and family. After consideration of risks, benefits and other options for treatment, the patient has consented to  Procedure(s): ABDOMINAL AORTOGRAM W/LOWER EXTREMITY (N/A) as a surgical intervention.  The patient's history has been reviewed, patient examined, no change in status, stable for surgery.  I have reviewed the patient's chart and labs.  Questions were answered to the patient's satisfaction.     Quay Burow

## 2021-08-27 NOTE — H&P (Signed)
09/26/2021 Hector Torres   July 04, 1941  [id number]   Primary Physician Janora Norlander, DO Primary Cardiologist: Lorretta Harp MD Lupe Carney, [place]   HPI:  Hector Torres is a 81 y.o. mildly overweight widowed Caucasian male father of 71, grandfather 6 grandchildren referred by Dr. Oval Linsey for evaluation of renal artery stenosis.  He is very tired Airline pilot.  His risk factors include long history tobacco abuse having smoked over 60 years and quit in 2012.Marland Kitchen  He does have a history of treated hypertension hyperlipidemia.  He had a TIA in the past but is never had a heart attack.  He has nonobstructive CAD.  He had renal Dopplers performed 01/04/2021 that showed no evidence of renal artery stenosis however abdominal CTA performed 06/25/2021 that suggested bilateral ostial renal artery stenosis suggesting that his hypertension may be secondary nature.     Active Medications      Current Meds  Medication Sig   aspirin 81 MG tablet Take 81 mg by mouth daily.   carvedilol (COREG) 6.25 MG tablet TAKE 1 TABLET TWICE DAILY WITH MEALS   cholecalciferol (VITAMIN D3) 25 MCG (1000 UNIT) tablet Take 1,000 Units by mouth every other day. Take 5 tablets by mouth every other day   diphenhydramine-acetaminophen (TYLENOL PM) 25-500 MG TABS tablet Take 2 tablets by mouth at bedtime as needed (sleep/pain).   fluticasone furoate-vilanterol (BREO ELLIPTA) 100-25 MCG/INH AEPB INHALE 1 PUFF EVERY DAY   hydrochlorothiazide (HYDRODIURIL) 25 MG tablet Take 0.5 tablets (12.5 mg total) by mouth daily.   irbesartan (AVAPRO) 75 MG tablet TAKE 1 TABLET TWICE DAILY   isosorbide mononitrate (IMDUR) 30 MG 24 hr tablet TAKE 1 TABLET TWICE DAILY (NEED MD APPOINTMENT)   Multiple Vitamin (MULTIVITAMIN PO) Take 1 tablet by mouth daily.   Multiple Vitamins-Minerals (PRESERVISION AREDS PO) Take by mouth daily.   rosuvastatin (CRESTOR) 20 MG tablet TAKE 1 TABLET EVERY DAY              Allergies  Allergen Reactions   Norvasc [Amlodipine Besylate] Swelling and Other (See Comments)      LE swelling      Social History         Socioeconomic History   Marital status: Widowed      Spouse name: Not on file   Number of children: Not on file   Years of education: Not on file   Highest education level: Not on file  Occupational History   Not on file  Tobacco Use   Smoking status: Former      Packs/day: 1.50      Years: 60.00      Pack years: 90.00      Types: Cigarettes      Quit date: 02/27/2011      Years since quitting: 10.4   Smokeless tobacco: Never  Vaping Use   Vaping Use: Never used  Substance and Sexual Activity   Alcohol use: No   Drug use: No   Sexual activity: Not on file  Other Topics Concern   Not on file  Social History Narrative   Not on file    Social Determinants of Health       Financial Resource Strain: Low Risk    Difficulty of Paying Living Expenses: Not hard at all  Food Insecurity: No Food Insecurity   Worried About [place] in the Last Year: Never true   Ran Out  of Food in the Last Year: Never true  Transportation Needs: No Transportation Needs   Lack of Transportation (Medical): No   Lack of Transportation (Non-Medical): No  Physical Activity: Inactive   Days of Exercise per Week: 0 days   Minutes of Exercise per Session: 0 min  Stress: No Stress Concern Present   Feeling of Stress : Only a little  Social Connections: Not on file  Intimate Partner Violence: Not on file      Review of Systems: General: negative for chills, fever, night sweats or weight changes.  Cardiovascular: negative for chest pain, dyspnea on exertion, edema, orthopnea, palpitations, paroxysmal nocturnal dyspnea or shortness of breath Dermatological: negative for rash Respiratory: negative for cough or wheezing Urologic: negative for hematuria Abdominal: negative for nausea, vomiting, diarrhea, bright red blood per rectum,  melena, or hematemesis Neurologic: negative for visual changes, syncope, or dizziness All other systems reviewed and are otherwise negative except as noted above.       Blood pressure (!) 162/68, pulse 89, height 5\' 10"  (1.778 m), weight 190 lb 6.4 oz (86.4 kg), SpO2 96 %.  General appearance: alert and no distress Neck: no adenopathy, no carotid bruit, no JVD, supple, symmetrical, trachea midline, and thyroid not enlarged, symmetric, no tenderness/mass/nodules Lungs: clear to auscultation bilaterally Heart: regular rate and rhythm, S1, S2 normal, no murmur, click, rub or gallop Extremities: extremities normal, atraumatic, no cyanosis or edema Pulses: 2+ and symmetric Skin: Skin color, texture, turgor normal. No rashes or lesions Neurologic: Grossly normal   EKG sinus rhythm at 89 with septal Q waves and left axis deviation.  I personally reviewed this EKG.   ASSESSMENT AND PLAN:    Resistant hypertension [name] was referred to me by Dr. Oval Linsey for resistant hypertension.  He is on 3 antihypertensive medicines with blood pressure that is suboptimally managed.  He did have renal Doppler studies performed 01/04/2021 that did not show renal artery stenosis.  Subsequent abdominal CTA performed 06/25/2021 suggested bilateral renal artery stenosis.  He certainly may benefit from angiography and intervention.  He is scheduled to see Dr. Oval Linsey in the near future at which time they will discuss this and if the consensus is to proceed I am happy to perform.         Lorretta Harp, M.D., [place], [facility], [place]verta [place] [address] [place], [place]  [address]  [phone] 08/27/2021 7:26 AM

## 2021-08-28 ENCOUNTER — Telehealth: Payer: Self-pay | Admitting: Cardiovascular Disease

## 2021-08-28 NOTE — Telephone Encounter (Signed)
Canceled renal US and called patient to inform him of this. Patient was very grateful for the follow up.  Lorretta Harp, MD  You 7 minutes ago (4:47 PM)   Okay to cancel renal Doppler studies next week

## 2021-08-28 NOTE — Telephone Encounter (Signed)
Patient states Dr. Gwenlyn Found didn't have to put any stent in his kidney yesterday.  So is wondering if his test that is schedule for next week is necessary.

## 2021-08-31 ENCOUNTER — Other Ambulatory Visit: Payer: Self-pay | Admitting: Family Medicine

## 2021-08-31 DIAGNOSIS — E782 Mixed hyperlipidemia: Secondary | ICD-10-CM

## 2021-09-03 ENCOUNTER — Encounter (HOSPITAL_BASED_OUTPATIENT_CLINIC_OR_DEPARTMENT_OTHER): Payer: Medicare HMO

## 2021-09-12 ENCOUNTER — Ambulatory Visit: Payer: Medicare HMO | Admitting: Cardiovascular Disease

## 2021-10-11 DIAGNOSIS — R3912 Poor urinary stream: Secondary | ICD-10-CM | POA: Diagnosis not present

## 2021-10-11 DIAGNOSIS — N200 Calculus of kidney: Secondary | ICD-10-CM | POA: Diagnosis not present

## 2021-10-11 DIAGNOSIS — N401 Enlarged prostate with lower urinary tract symptoms: Secondary | ICD-10-CM | POA: Diagnosis not present

## 2021-11-06 ENCOUNTER — Ambulatory Visit: Payer: Medicare HMO | Admitting: Pulmonary Disease

## 2021-11-06 ENCOUNTER — Other Ambulatory Visit: Payer: Self-pay

## 2021-11-06 ENCOUNTER — Ambulatory Visit (INDEPENDENT_AMBULATORY_CARE_PROVIDER_SITE_OTHER): Payer: Medicare HMO | Admitting: Pulmonary Disease

## 2021-11-06 ENCOUNTER — Emergency Department (HOSPITAL_COMMUNITY)
Admission: EM | Admit: 2021-11-06 | Discharge: 2021-11-06 | Disposition: A | Discharge status: Home / Self Care | Payer: Medicare HMO | Attending: Emergency Medicine | Admitting: Emergency Medicine

## 2021-11-06 ENCOUNTER — Encounter: Payer: Self-pay | Admitting: Pulmonary Disease

## 2021-11-06 DIAGNOSIS — J449 Chronic obstructive pulmonary disease, unspecified: Secondary | ICD-10-CM | POA: Diagnosis not present

## 2021-11-06 DIAGNOSIS — R55 Syncope and collapse: Secondary | ICD-10-CM | POA: Diagnosis not present

## 2021-11-06 DIAGNOSIS — R0902 Hypoxemia: Secondary | ICD-10-CM | POA: Diagnosis not present

## 2021-11-06 DIAGNOSIS — Z5321 Procedure and treatment not carried out due to patient leaving prior to being seen by health care provider: Secondary | ICD-10-CM | POA: Diagnosis not present

## 2021-11-06 DIAGNOSIS — I959 Hypotension, unspecified: Secondary | ICD-10-CM | POA: Insufficient documentation

## 2021-11-06 DIAGNOSIS — J441 Chronic obstructive pulmonary disease with (acute) exacerbation: Secondary | ICD-10-CM

## 2021-11-06 LAB — COMPREHENSIVE METABOLIC PANEL
ALT: 15 U/L (ref 0–44)
AST: 19 U/L (ref 15–41)
Albumin: 3.3 g/dL — ABNORMAL LOW (ref 3.5–5.0)
Alkaline Phosphatase: 44 U/L (ref 38–126)
Anion gap: 8 (ref 5–15)
BUN: 20 mg/dL (ref 8–23)
CO2: 29 mmol/L (ref 22–32)
Calcium: 8.8 mg/dL — ABNORMAL LOW (ref 8.9–10.3)
Chloride: 104 mmol/L (ref 98–111)
Creatinine, Ser: 1.35 mg/dL — ABNORMAL HIGH (ref 0.61–1.24)
GFR, Estimated: 53 mL/min — ABNORMAL LOW (ref >60)
Glucose, Bld: 109 mg/dL — ABNORMAL HIGH (ref 70–99)
Potassium: 4.2 mmol/L (ref 3.5–5.1)
Sodium: 141 mmol/L (ref 135–145)
Total Bilirubin: 0.8 mg/dL (ref 0.3–1.2)
Total Protein: 6 g/dL — ABNORMAL LOW (ref 6.5–8.1)

## 2021-11-06 LAB — PULMONARY FUNCTION TEST
DL/VA % pred: 47 %
DL/VA: 1.84 ml/min/mmHg/L
DLCO unc % pred: 38 %
DLCO unc: 9.37 ml/min/mmHg
FEF 25-75 Pre: 0.49 L/sec
FEF2575-%Pred-Pre: 25 %
FEV1-%Pred-Pre: 39 %
FEV1-Pre: 1.12 L
FEV1FVC-%Pred-Pre: 55 %
FEV6-%Pred-Pre: 73 %
FEV6-Pre: 2.72 L
FEV6FVC-%Pred-Pre: 102 %
FVC-%Pred-Pre: 71 %
FVC-Pre: 2.85 L
Pre FEV1/FVC ratio: 39 %
Pre FEV6/FVC Ratio: 96 %
RV % pred: 111 %
RV: 2.97 L
TLC % pred: 94 %
TLC: 6.66 L

## 2021-11-06 LAB — CBC
HCT: 48.7 % (ref 39.0–52.0)
Hemoglobin: 16.3 g/dL (ref 13.0–17.0)
MCH: 34 pg (ref 26.0–34.0)
MCHC: 33.5 g/dL (ref 30.0–36.0)
MCV: 101.7 fL — ABNORMAL HIGH (ref 80.0–100.0)
Platelets: 186 10*3/uL (ref 150–400)
RBC: 4.79 MIL/uL (ref 4.22–5.81)
RDW: 12.7 % (ref 11.5–15.5)
WBC: 9.3 10*3/uL (ref 4.0–10.5)
nRBC: 0 % (ref 0.0–0.2)

## 2021-11-06 NOTE — ED Provider Triage Note (Signed)
Emergency Medicine Provider Triage Evaluation Note ? ?Hector Torres , a 81 y.o. male  was evaluated in triage.  Pt presenting from the pulmonology office due to a syncopal episode during his breathing test.  Hector Torres he was hyperventilating for the test and then all of a sudden lost consciousness.  Denies hitting his head.  No history of cardiac arrhythmia however does have a history of CAD and COPD. ? ?Review of Systems  ?Positive: No symptoms right now ?Negative: No symptoms right now ? ?Physical Exam  ?BP (!) 102/57   Pulse 70   Temp 97.7 ?F (36.5 ?C) (Oral)   Resp 16   SpO2 96%  ?Gen:   Awake, no distress   ?Resp:  Normal effort  ?MSK:   Moves extremities without difficulty  ?Other:  RRR, lung sounds clear ? ?Medical Decision Making  ?Medically screening exam initiated at 11:32 AM.  Appropriate orders placed.  Hector Torres was informed that the remainder of the evaluation will be completed by another provider, this initial triage assessment does not replace that evaluation, and the importance of remaining in the ED until their evaluation is complete. ? ?Original blood pressure 80s over 50sx2.  They are checked 102/57.  Patient reports that twice last week it was 55s over 36s.  Follows with cardiologist. ? ?11: 20 a: nursing staff notified that he needed a room due to elderly, syncopal and hypotension.  They said that they will do their best. ?  ?Hector Hammock, PA-C ?11/06/21 1134 ? ?

## 2021-11-06 NOTE — Progress Notes (Signed)
Spirometry done today. 

## 2021-11-06 NOTE — ED Triage Notes (Signed)
Pt. Stated, Ive had this kind of yo yo going on for awhile. My Dr. Has done everything and can not figure it out. ?

## 2021-11-06 NOTE — Progress Notes (Signed)
81 year old for follow-up of COPD. ? ?PMH -renal artery stenosis, CAD -PTCA to left circumflex 1995 ? ?Emergently called to PFT at home because patient had passed out.  Found him passed out while sitting in the body box , unresponsive to painful stimulus , agonal breathing, weak pulse.  Call for help, he was laid down to the floor and given 2 breaths with an Ambu bag with return of good pulse and improvement in his breathing and color.  He regained consciousness and was following commands and noted to be nonfocal. ?Oxygen saturation improved to 98% on 2 L nasal cannula, clear breath sounds bilateral, blood pressure noted to be 170/88. ?EMS was called ? ?EKG strip was obtained which showed first-degree heart block ? ?Med review shows Coreg, Avapro and HCTZ. ?Patient reports that he had 2 or 3 similar episodes of passing out at church, EMS was called but did not go to the emergency room. ? ?Reexamined patient after 15 minutes, sitting up in the chair, EMS present, able to speak in full sentences.  He did not want to go to the emergency room.  I explained the gravity of the situation and explained to him that he needed cardiac evaluation and reassessment of his blood pressure meds.  While this could have been a vagal episode due to PFT maneuver, he had incontinence and would clearly need cardiac evaluation/monitoring for at least 24 hours. ? ?He agreed to go to the emergency room ?Spirometry was reviewed which shows severe airway obstruction, he will continue on Trelegy ? ?Total critical care time was 40 minutes ? ?Leanna Sato Elsworth Soho MD ? ?

## 2021-11-06 NOTE — ED Triage Notes (Signed)
EMS stated, He had a syncope episode and has had the episodes before. BP is low.  ?

## 2021-11-06 NOTE — ED Provider Notes (Signed)
I was approached by an EMT who informed me that the patient wanted to leave AMA. Upon chart review, it appears that there was some concern that the patient was quite hypotensive on initial presentation. Last blood pressure was 115/91. He did get fluids with EMS. I personally spent several minutes discussing the patients care with him. We discussed the nature and purpose, risks and benefits, as well as, the alternatives of treatment. Time was given to allow the opportunity to ask questions and consider their options, and after the discussion, the patient decided to refuse the offerred treatment. The patient was informed that refusal could lead to, but was not limited to, death, permanent disability, or severe pain. Prior to refusing, I determined that the patient had the capacity to make their decision and understood the consequences of that decision. After refusal, I made every reasonable opportunity to treat them to the best of my ability.  The patient was notified that they may return to the emergency department at any time for further treatment.  ? ?  ?Bud Face, PA-C ?11/06/21 1441 ? ?  ?Elnora Morrison, MD ?11/07/21 [phone] ? ?

## 2021-11-07 ENCOUNTER — Ambulatory Visit (HOSPITAL_BASED_OUTPATIENT_CLINIC_OR_DEPARTMENT_OTHER): Payer: Medicare HMO | Admitting: Cardiovascular Disease

## 2021-12-17 NOTE — Progress Notes (Signed)
? ? ?Cardiology Office Note ? ?Date: 12/18/2021  ? ?ID: Hector Torres, DOB [birth year], MRN [id number] ? ?PCP:  Janora Norlander, DO  ?Cardiologist:  Rozann Lesches, MD ?Electrophysiologist:  None  ? ?Chief Complaint  ?Patient presents with  ? Cardiac follow-up  ? ? ?History of Present Illness: ?Hector Torres is an 81 y.o. male that I last saw in October 2022. I reviewed the interval chart.  He is here for a routine visit.  Reports no chest pain, no worsening shortness of breath with activity. ? ?He had an episode of apparent neurocardiogenic syncope during PFT's back in March. He was attended to by Dr. Elsworth Soho and then EMS, taken to the ER at [facility] bu ultimately left AMA.  I reviewed his ECG from that time. ? ?He was evaluated for suspected RAS in January with angiography showing only mild renal artery atherosclerosis. ? ?We went over his home blood pressure checks, he does check his pressures in his left arm.  He has evidence of left subclavian stenosis however.  Right arm pressures are generally higher.  We went over his medications, he stopped taking Avapro completely and only uses it when his systolic is significantly elevated.  I asked him to start checking his blood pressure with his right arm cuff and suspect he will need to go back on Avapro at least once daily. ? ?Past Medical History:  ?Diagnosis Date  ? Arthritis   ? BPH (benign prostatic hypertrophy)   ? Carotid artery disease ([place])   ? Colon polyp   ? COPD (chronic obstructive pulmonary disease) ([place])   ? Coronary atherosclerosis of native coronary artery   ? PTCA circumflex at [facility], nonobstructive residual disease at catheterization June and October 2016  ? Essential hypertension   ? GERD (gastroesophageal reflux disease)   ? History of nephrolithiasis   ? Mixed hyperlipidemia   ? Prostate nodule   ? PVD (peripheral vascular disease) ([place])   ? TIA (transient ischemic attack)   ? Vitamin D deficiency   ? ? ?Past Surgical History:  ?Procedure  Laterality Date  ? ABDOMINAL AORTOGRAM W/LOWER EXTREMITY N/A 08/27/2021  ? Procedure: ABDOMINAL AORTOGRAM W/LOWER EXTREMITY;  Surgeon: Lorretta Harp, MD;  Location: [facility] [facility];  Service: Cardiovascular;  Laterality: N/A;  ? ANGIOPLASTY Left 1995  ? Cir. Artery  ? APPENDECTOMY  1947  ? CARDIAC CATHETERIZATION N/A 02/03/2015  ? Procedure: Left Heart Cath and Cors/Grafts Angiography;  Surgeon: Sherren Mocha, MD;  Location: [place] [facility] LAB;  Service: Cardiovascular;  Laterality: N/A;  ? CARDIAC CATHETERIZATION N/A 05/19/2015  ? Procedure: Left Heart Cath and Coronary Angiography;  Surgeon: Troy Sine, MD;  Location: [place] [facility];  Service: Cardiovascular;  Laterality: N/A;  ? COLONOSCOPY N/A 03/19/2018  ? Procedure: COLONOSCOPY;  Surgeon: Danie Binder, MD;  [facility];  Service: Endoscopy;  Laterality: N/A;  12:00  ? EYE SURGERY    ? Bilateral cataract  ? POLYPECTOMY  03/19/2018  ? Procedure: POLYPECTOMY;  Surgeon: Danie Binder, MD;  Location: [facility];  Service: Endoscopy;;  ? TONSILLECTOMY  1968  ? Traumatic injury  1962  ? Lost tip of index, middle, and ring finger/ right hand  ? ? ?Current Outpatient Medications  ?Medication Sig Dispense Refill  ? albuterol (VENTOLIN HFA) 108 (90 Base) MCG/ACT inhaler Inhale 2 puffs into the lungs every 6 (six) hours as needed for wheezing or shortness of breath. 8 g 6  ?  aspirin EC 81 MG tablet Take 81 mg by mouth at bedtime. Swallow whole.    ? carvedilol (COREG) 6.25 MG tablet TAKE 1 TABLET TWICE DAILY WITH MEALS 180 tablet 0  ? cholecalciferol (VITAMIN D3) 25 MCG (1000 UNIT) tablet Take 1,000 Units by mouth every other day.    ? diphenhydramine-acetaminophen (TYLENOL PM) 25-500 MG TABS tablet Take 2 tablets by mouth at bedtime as needed (sleep/pain).    ? Fluticasone-Umeclidin-Vilant (TRELEGY ELLIPTA) 100-62.5-25 MCG/ACT AEPB Inhale 1 puff into the lungs daily. 180 each 3  ? hydrochlorothiazide (HYDRODIURIL) 25 MG tablet TAKE 1 TABLET  EVERY DAY 90 tablet 1  ? irbesartan (AVAPRO) 75 MG tablet TAKE 1 TABLET TWICE DAILY 180 tablet 3  ? isosorbide mononitrate (IMDUR) 30 MG 24 hr tablet TAKE 1 TABLET TWICE DAILY (NEED MD APPOINTMENT) 180 tablet 3  ? Multiple Vitamin (MULTIVITAMIN WITH MINERALS) TABS tablet Take 1 tablet by mouth at bedtime.    ? Multiple Vitamins-Minerals (PRESERVISION AREDS PO) Take 1 tablet by mouth in the morning and at bedtime.    ? rosuvastatin (CRESTOR) 20 MG tablet TAKE 1 TABLET EVERY DAY 90 tablet 0  ? ?Current Facility-Administered Medications  ?Medication Dose Route Frequency Provider Last Rate Last Admin  ? sodium chloride flush (NS) 0.9 % injection 3 mL  3 mL Intravenous Q12H Lorretta Harp, MD      ? ?Allergies:  Norvasc [amlodipine besylate]  ? ?ROS: No palpitations. ? ?Physical Exam: ?VS:  BP (!) 152/84   Pulse 66   Ht '5\' 10"'$  (1.778 m)   Wt 194 lb (88 kg)   SpO2 97%   BMI 27.84 kg/m? , BMI Body mass index is 27.84 kg/m?. ? ?Wt Readings from Last 3 Encounters:  ?12/18/21 194 lb (88 kg)  ?08/27/21 195 lb (88.5 kg)  ?07/31/21 191 lb (86.6 kg)  ?  ?General: Patient appears comfortable at rest. ?HEENT: Conjunctiva and lids normal. ?Neck: Supple, no elevated JVP or carotid bruits, no thyromegaly. ?Lungs: Clear to auscultation, nonlabored breathing at rest. ?Cardiac: Regular rate and rhythm, no S3, 1/6 systolic murmur, no pericardial rub. ?Extremities: No pitting edema. ? ?ECG:  An ECG dated 11/07/2021 was personally reviewed today and demonstrated:  Sinus rhythm with leftward axis, PRWP, nonspecific ST changes. ? ?Recent Labwork: ?04/30/2021: BNP 225.3 ?11/06/2021: ALT 15; AST 19; BUN 20; Creatinine, Ser 1.35; Hemoglobin 16.3; Platelets 186; Potassium 4.2; Sodium 141  ?   ?Component Value Date/Time  ? CHOL 109 04/30/2021 1511  ? CHOL 102 12/25/2012 1251  ? TRIG 88 04/30/2021 1511  ? TRIG 82 12/25/2012 1251  ? HDL 42 04/30/2021 1511  ? HDL 36 (L) 12/25/2012 1251  ? CHOLHDL 2.6 04/30/2021 1511  ? [place] 50 04/30/2021 1511   ? [place] 50 12/25/2012 1251  ? ? ?Other Studies Reviewed Today: ? ?Carotid Dopplers 05/08/2021: ?Summary:  ?Right Carotid: Velocities in the right ICA are consistent with a 40-59%  ?               stenosis. Non-hemodynamically significant plaque <50% noted  ?in  ?               the CCA. Stable RICA velocities.  ? ?Left Carotid: Velocities in the left ICA are consistent with a 40-59%  ?stenosis.  ?              Hemodynamically significant plaque >50% visualized in the  ?CCA.  ?  Stable LICA velocities.  ? ?Vertebrals:  Right vertebral artery demonstrates antegrade flow. Left  ?vertebral  ?             artery demonstrates bidirectional flow.  ?Subclavians: Left subclavian artery was stenotic. Left subclavian artery  ?flow  ?             was disturbed. Normal flow hemodynamics were seen in the  ?right  ?             subclavian artery.  ? There is bi-directional flow noted in the left vertebral artery. The  ?right brachial artery is brisk and triphasic; the left is brisk and  ?biphasic.  ? ?Echocardiogram 07/10/2021: ? 1. Left ventricular ejection fraction, by estimation, is 55 to 60%. The  ?left ventricle has normal function. The left ventricle demonstrates  ?regional wall motion abnormalities (see scoring diagram/findings for  ?description). There is mild left ventricular  ? hypertrophy. Left ventricular diastolic parameters are indeterminate.  ? 2. Right ventricular systolic function is normal. The right ventricular  ?size is normal. There is normal pulmonary artery systolic pressure. The  ?estimated right ventricular systolic pressure is 80.0 mmHg.  ? 3. The mitral valve is grossly normal. Mild mitral valve regurgitation.  ? 4. The aortic valve is tricuspid. Aortic valve regurgitation is mild.  ?Aortic valve sclerosis is present, with no evidence of aortic valve  ?stenosis. Aortic regurgitation PHT measures 756 msec.  ? 5. The inferior vena cava is normal in size with greater than 50%  ?respiratory  variability, suggesting right atrial pressure of 3 mmHg. ? ?Renal artery angiogram 08/27/2021: ?Angiographic Data:  ?  ?1: Abdominal aortogram-renal arteries were patent.  There was mild infrarenal abdominal aor

## 2021-12-18 ENCOUNTER — Encounter: Payer: Self-pay | Admitting: Cardiology

## 2021-12-18 ENCOUNTER — Ambulatory Visit: Payer: Medicare HMO | Admitting: Cardiology

## 2021-12-18 VITALS — BP 152/84 | HR 66 | Ht 70.0 in | Wt 194.0 lb

## 2021-12-18 DIAGNOSIS — E782 Mixed hyperlipidemia: Secondary | ICD-10-CM | POA: Diagnosis not present

## 2021-12-18 DIAGNOSIS — I25119 Atherosclerotic heart disease of native coronary artery with unspecified angina pectoris: Secondary | ICD-10-CM | POA: Diagnosis not present

## 2021-12-18 DIAGNOSIS — I771 Stricture of artery: Secondary | ICD-10-CM | POA: Diagnosis not present

## 2021-12-18 DIAGNOSIS — I10 Essential (primary) hypertension: Secondary | ICD-10-CM

## 2021-12-18 NOTE — Patient Instructions (Signed)

## 2021-12-20 ENCOUNTER — Ambulatory Visit: Payer: Medicare HMO | Admitting: Pulmonary Disease

## 2021-12-20 ENCOUNTER — Encounter: Payer: Self-pay | Admitting: Pulmonary Disease

## 2021-12-20 DIAGNOSIS — I6523 Occlusion and stenosis of bilateral carotid arteries: Secondary | ICD-10-CM

## 2021-12-20 DIAGNOSIS — J432 Centrilobular emphysema: Secondary | ICD-10-CM | POA: Diagnosis not present

## 2021-12-20 NOTE — Progress Notes (Signed)
? ?  Subjective:  ? ? Patient ID: Hector Torres, male    DOB: 12-10-1940, 81 y.o.   MRN: [id number] ? ?HPI ? ?81 year old for follow-up of COPD. ?  ?PMH -renal artery stenosis - neg angio ?CAD -PTCA to left circumflex 1995 ?10/2021 neurocardiogenic syncope  with PFT maneuver 'vasovagal' ?-left subclavian stenosis ?-He smoked more than 90 pack years until he quit in 2012 ? ? ?He appears much improved compared to his last visit with [place] when he had syncope during PFTs and needed to be transferred to the ER.  He signed out after initial evaluation was negative for cardiac cause.  Cardiology consultation agree that this was likely neurocardiogenic/vasovagal with PFT maneuver ?His breathing is back to baseline , compliant with Trelegy. ?Denies wheezing or increased use of albuterol ? ?We reviewed PFTs today ?He was unable to enroll in pulmonary rehab program ? ?Significant tests/ events reviewed ? ?PFTs 10/2021 severe airway obs, ratio 39, FEV1 2.04/39% ,FVC 71%, TLC nml, DLCO 9.4/38% ? ?CTA angiogram chest 04/2015 emphysema ?  ?CT abdomen 06/2021 minimal right lower lobe scarring ? ? ? ? ?Review of Systems ?neg for any significant sore throat, dysphagia, itching, sneezing, nasal congestion or excess/ purulent secretions, fever, chills, sweats, unintended wt loss, pleuritic or exertional cp, hempoptysis, orthopnea pnd or change in chronic leg swelling. Also denies presyncope, palpitations, heartburn, abdominal pain, nausea, vomiting, diarrhea or change in bowel or urinary habits, dysuria,hematuria, rash, arthralgias, visual complaints, headache, numbness weakness or ataxia. ? ?   ?Objective:  ? Physical Exam ? ?Gen. Pleasant, elderly, well-nourished, in no distress ?ENT - no thrush, no pallor/icterus,no post nasal drip ?Neck: No JVD, no thyromegaly, no carotid bruits ?Lungs: no use of accessory muscles, no dullness to percussion, clear without rales or rhonchi  ?Cardiovascular: Rhythm regular, heart sounds  normal, no murmurs  or gallops, no peripheral edema ?Musculoskeletal: No deformities, no cyanosis or clubbing  ? ? ? ?   ?Assessment & Plan:  ? ? ?

## 2021-12-20 NOTE — Assessment & Plan Note (Signed)
PFTs show severe airway obstruction, he will continue on Trelegy ?Use albuterol nebs for rescue. ?We discussed COPD action plan and signs and symptoms of COPD flare. ?He has been unable to assess center-based rehab ?

## 2021-12-20 NOTE — Assessment & Plan Note (Signed)
Stenosis is not critical.  I do feel that his syncope episode was related to neurocardiogenic/vasovagal after PFT maneuver ?

## 2021-12-20 NOTE — Patient Instructions (Signed)
?  We discussed COPD action plan ? ?Continue on trelegy  ?

## 2022-01-28 ENCOUNTER — Encounter: Payer: Self-pay | Admitting: Family Medicine

## 2022-01-28 ENCOUNTER — Ambulatory Visit (INDEPENDENT_AMBULATORY_CARE_PROVIDER_SITE_OTHER): Payer: Medicare HMO | Admitting: Family Medicine

## 2022-01-28 VITALS — BP 99/53 | HR 77 | Temp 97.6°F | Ht 70.0 in | Wt 192.8 lb

## 2022-01-28 DIAGNOSIS — Z596 Low income: Secondary | ICD-10-CM | POA: Diagnosis not present

## 2022-01-28 DIAGNOSIS — J439 Emphysema, unspecified: Secondary | ICD-10-CM

## 2022-01-28 DIAGNOSIS — R0989 Other specified symptoms and signs involving the circulatory and respiratory systems: Secondary | ICD-10-CM

## 2022-01-28 DIAGNOSIS — E782 Mixed hyperlipidemia: Secondary | ICD-10-CM

## 2022-01-28 DIAGNOSIS — I25118 Atherosclerotic heart disease of native coronary artery with other forms of angina pectoris: Secondary | ICD-10-CM | POA: Diagnosis not present

## 2022-01-28 NOTE — Patient Instructions (Signed)
Go to 1/2 tablet of the hydrochlorothiazide if your BPs are low like they are today.  May take 1 full tablet on days that BP >150/90.

## 2022-01-28 NOTE — Progress Notes (Signed)
Subjective: CC:f/u BP, COPD PCP: Janora Norlander, DO Hector Torres is a 81 y.o. male presenting to clinic today for:  1.  Labile blood pressure/hypertension with hyperlipidemia and CAD Patient had a syncopal episode while doing pulmonary function test recently at the pulmonologist.  I felt this to be a vasovagal episode.  His ARB has been discontinued and he is only treated with Coreg, isosorbide and HCTZ now.  He denies any edema.  No chest pain, shortness of breath or headaches.  He brings blood pressure readings which range anywhere from systolics of 88 to systolics of 889.  Diastolics typically ranging in the 60s to 70s.  Pulse has been fairly normal in the 70s to 80s with only 2 bradycardic episodes in the 60s.  2.  COPD Seen by pulmonology recently and had PFTs with subsequent syncopal episode as above.  He uses Trelegy and does find that to be so much more helpful than his previous inhaler.  He unfortunately is nearing the donut hole on that and worried about affording it going forward however.  He will be doing pulmonary rehab at home, this has been arranged by his pulmonologist.  They are setting him up with some type of wristband that we will monitor his pulse ox etc.  No hemoptysis.   ROS: Per HPI  Allergies  Allergen Reactions   Norvasc [Amlodipine Besylate] Swelling and Other (See Comments)    LE swelling   Past Medical History:  Diagnosis Date   Arthritis    BPH (benign prostatic hypertrophy)    Carotid artery disease (HCC)    Colon polyp    COPD (chronic obstructive pulmonary disease) (HCC)    Coronary atherosclerosis of native coronary artery    PTCA circumflex at [facility], nonobstructive residual disease at catheterization June and October 2016   Essential hypertension    GERD (gastroesophageal reflux disease)    History of nephrolithiasis    Mixed hyperlipidemia    Prostate nodule    PVD (peripheral vascular disease) (HCC)    TIA (transient ischemic  attack)    Vitamin D deficiency     Current Outpatient Medications:    tamsulosin (FLOMAX) 0.4 MG CAPS capsule, Take 0.4 mg by mouth daily., Disp: , Rfl:    albuterol (VENTOLIN HFA) 108 (90 Base) MCG/ACT inhaler, Inhale 2 puffs into the lungs every 6 (six) hours as needed for wheezing or shortness of breath., Disp: 8 g, Rfl: 6   aspirin EC 81 MG tablet, Take 81 mg by mouth at bedtime. Swallow whole., Disp: , Rfl:    carvedilol (COREG) 6.25 MG tablet, TAKE 1 TABLET TWICE DAILY WITH MEALS, Disp: 180 tablet, Rfl: 0   cholecalciferol (VITAMIN D3) 25 MCG (1000 UNIT) tablet, Take 1,000 Units by mouth every other day., Disp: , Rfl:    diphenhydramine-acetaminophen (TYLENOL PM) 25-500 MG TABS tablet, Take 2 tablets by mouth at bedtime as needed (sleep/pain)., Disp: , Rfl:    Fluticasone-Umeclidin-Vilant (TRELEGY ELLIPTA) 100-62.5-25 MCG/ACT AEPB, Inhale 1 puff into the lungs daily., Disp: 180 each, Rfl: 3   hydrochlorothiazide (HYDRODIURIL) 25 MG tablet, TAKE 1 TABLET EVERY DAY, Disp: 90 tablet, Rfl: 1   isosorbide mononitrate (IMDUR) 30 MG 24 hr tablet, TAKE 1 TABLET TWICE DAILY (NEED MD APPOINTMENT), Disp: 180 tablet, Rfl: 3   Multiple Vitamin (MULTIVITAMIN WITH MINERALS) TABS tablet, Take 1 tablet by mouth at bedtime., Disp: , Rfl:    Multiple Vitamins-Minerals (PRESERVISION AREDS PO), Take 1 tablet by mouth in the morning  and at bedtime., Disp: , Rfl:    rosuvastatin (CRESTOR) 20 MG tablet, TAKE 1 TABLET EVERY DAY, Disp: 90 tablet, Rfl: 0  Current Facility-Administered Medications:    sodium chloride flush (NS) 0.9 % injection 3 mL, 3 mL, Intravenous, Q12H, Lorretta Harp, MD Social History   Socioeconomic History   Marital status: Widowed    Spouse name: Not on file   Number of children: Not on file   Years of education: Not on file   Highest education level: Not on file  Occupational History   Not on file  Tobacco Use   Smoking status: Former    Packs/day: 1.50    Years: 60.00     Total pack years: 90.00    Types: Cigarettes    Quit date: 02/27/2011    Years since quitting: 10.9   Smokeless tobacco: Never  Vaping Use   Vaping Use: Never used  Substance and Sexual Activity   Alcohol use: No   Drug use: No   Sexual activity: Not on file  Other Topics Concern   Not on file  Social History Narrative   Not on file   Social Determinants of Health   Financial Resource Strain: Low Risk  (12/27/2020)   Overall Financial Resource Strain (CARDIA)    Difficulty of Paying Living Expenses: Not hard at all  Food Insecurity: No Food Insecurity (12/27/2020)   Hunger Vital Sign    Worried About Running Out of Food in the Last Year: Never true    [place] in the Last Year: Never true  Transportation Needs: No Transportation Needs (12/27/2020)   PRAPARE - Hydrologist (Medical): No    Lack of Transportation (Non-Medical): No  Physical Activity: Inactive (12/27/2020)   Exercise Vital Sign    Days of Exercise per Week: 0 days    Minutes of Exercise per Session: 0 min  Stress: No Stress Concern Present (12/27/2020)   [place]    Feeling of Stress : Only a little  Social Connections: Not on file  Intimate Partner Violence: Not on file   Family History  Problem Relation Age of Onset   Gastric cancer Mother    Varicose Veins Mother    Cancer Mother        Stomach   Prostate cancer Father    Cancer Father        Prostate   Other Brother        carotid artery stenosis   Diabetes Brother    Leukemia Brother    Hyperlipidemia Brother    Hypertension Brother    Colon cancer Brother    Hyperlipidemia Brother    Peripheral vascular disease Brother    Cancer Brother        Colon and Prostate   Hypertension Sister    Hyperlipidemia Sister    Diabetes Daughter    Diabetes Son    Prostate cancer Brother     Objective: Office vital signs reviewed. BP (!) 99/53    Pulse 77   Temp 97.6 F (36.4 C)   Ht '5\' 10"'  (1.778 m)   Wt 192 lb 12.8 oz (87.5 kg)   SpO2 95%   BMI 27.66 kg/m   Physical Examination:  General: Awake, alert, well nourished, well appearing male. No acute distress HEENT: sclera white.  Moist mucous membranes Cardio: regular rate and rhythm, S1S2 heard, no murmurs appreciated Pulm: clear to auscultation bilaterally,  no wheezes, rhonchi or rales; normal work of breathing on room air MSK: Ambulating independently  Assessment/ Plan: 81 y.o. male   Pulmonary emphysema, unspecified emphysema type ([place]) - Plan: CBC, AMB Referral to [place]  Patient cannot afford medications - Plan: AMB Referral to [place]  Atherosclerosis of native coronary artery of native heart with stable angina pectoris ([place]) - Plan: CMP14+EGFR, Lipid Panel, TSH  Labile blood pressure  Mixed hyperlipidemia - Plan: CMP14+EGFR, Lipid Panel, TSH  I have given him samples of Trelegy.  I will refer him to our clinical pharmacist in efforts to see if he can get some patient assistance with this particular medication as he has not responded well to [facility] in the past.  Check lipid, TSH, CMP.  Continue beta-blocker and Imdur as prescribed.  Okay to use half a dose of the hydrochlorothiazide instead of full dose given low blood pressure.  He has suffered from labile blood pressures and for this reason I discussed with him that if his blood pressure goes above 150 or above 90 that he can resume full dose of that HCTZ.  Off of ARB for same reason  Continue current cholesterol regimen.  Check fasting lipid  No orders of the defined types were placed in this encounter.  No orders of the defined types were placed in this encounter.    Janora Norlander, DO [place] [phone]

## 2022-01-29 ENCOUNTER — Telehealth: Payer: Self-pay

## 2022-01-29 LAB — CBC
Hematocrit: 46.3 % (ref 37.5–51.0)
Hemoglobin: 15.9 g/dL (ref 13.0–17.7)
MCH: 33.9 pg — ABNORMAL HIGH (ref 26.6–33.0)
MCHC: 34.3 g/dL (ref 31.5–35.7)
MCV: 99 fL — ABNORMAL HIGH (ref 79–97)
Platelets: 186 10*3/uL (ref 150–450)
RBC: 4.69 x10E6/uL (ref 4.14–5.80)
RDW: 12.5 % (ref 11.6–15.4)
WBC: 6.8 10*3/uL (ref 3.4–10.8)

## 2022-01-29 LAB — CMP14+EGFR
ALT: 14 IU/L (ref 0–44)
AST: 18 IU/L (ref 0–40)
Albumin/Globulin Ratio: 1.7 (ref 1.2–2.2)
Albumin: 3.6 g/dL (ref 3.6–4.6)
Alkaline Phosphatase: 60 IU/L (ref 44–121)
BUN/Creatinine Ratio: 18 (ref 10–24)
BUN: 17 mg/dL (ref 8–27)
Bilirubin Total: 0.5 mg/dL (ref 0.0–1.2)
CO2: 27 mmol/L (ref 20–29)
Calcium: 8.9 mg/dL (ref 8.6–10.2)
Chloride: 103 mmol/L (ref 96–106)
Creatinine, Ser: 0.96 mg/dL (ref 0.76–1.27)
Globulin, Total: 2.1 g/dL (ref 1.5–4.5)
Glucose: 79 mg/dL (ref 70–99)
Potassium: 3.7 mmol/L (ref 3.5–5.2)
Sodium: 142 mmol/L (ref 134–144)
Total Protein: 5.7 g/dL — ABNORMAL LOW (ref 6.0–8.5)
eGFR: 79 mL/min/{1.73_m2} (ref >59)

## 2022-01-29 LAB — TSH: TSH: 1.25 u[IU]/mL (ref 0.450–4.500)

## 2022-01-29 LAB — LIPID PANEL
Chol/HDL Ratio: 2.5 ratio (ref 0.0–5.0)
Cholesterol, Total: 100 mg/dL (ref 100–199)
HDL: 40 mg/dL (ref >39)
LDL Chol Calc (NIH): 42 mg/dL (ref 0–99)
Triglycerides: 93 mg/dL (ref 0–149)
VLDL Cholesterol Cal: 18 mg/dL (ref 5–40)

## 2022-01-29 NOTE — Chronic Care Management (AMB) (Signed)
  Chronic Care Management   Note  01/29/2022 Name: GERAMY LAMORTE MRN: [id number] DOB: 1941-04-12  NATHANIAL ARRIGHI is a 81 y.o. year old male who is a primary care patient of Janora Norlander, DO. I reached out to Jolee Ewing by phone today in response to a referral sent by Mr. Al Bracewell [facility] PCP.  Mr. Towell was given information about Chronic Care Management services today including:  CCM service includes personalized support from designated clinical staff supervised by his physician, including individualized plan of care and coordination with other care providers 24/7 contact phone numbers for assistance for urgent and routine care needs. Service will only be billed when office clinical staff spend 20 minutes or more in a month to coordinate care. Only one practitioner may furnish and bill the service in a calendar month. The patient may stop CCM services at any time (effective at the end of the month) by phone call to the office staff. The patient is responsible for co-pay (up to 20% after annual deductible is met) if co-pay is required by the individual health plan.   Patient agreed to services and verbal consent obtained.   Follow up plan: Telephone appointment with care management team member scheduled for:02/26/2022  Noreene Larsson, [place], [place], [place] [address] Direct Dial: [phone] [email or web address]

## 2022-02-20 ENCOUNTER — Ambulatory Visit: Payer: Medicare HMO | Admitting: Cardiology

## 2022-02-26 ENCOUNTER — Ambulatory Visit (INDEPENDENT_AMBULATORY_CARE_PROVIDER_SITE_OTHER): Payer: Medicare HMO | Admitting: Pharmacist

## 2022-02-26 DIAGNOSIS — E782 Mixed hyperlipidemia: Secondary | ICD-10-CM

## 2022-02-26 DIAGNOSIS — Z87891 Personal history of nicotine dependence: Secondary | ICD-10-CM

## 2022-02-26 DIAGNOSIS — J432 Centrilobular emphysema: Secondary | ICD-10-CM

## 2022-02-26 DIAGNOSIS — R0609 Other forms of dyspnea: Secondary | ICD-10-CM

## 2022-03-01 ENCOUNTER — Encounter: Payer: Self-pay | Admitting: Family Medicine

## 2022-03-01 ENCOUNTER — Telehealth: Payer: Self-pay | Admitting: Family Medicine

## 2022-03-01 NOTE — Telephone Encounter (Signed)
Pt aware to try and send by [employer]

## 2022-03-07 MED ORDER — TRELEGY ELLIPTA 100-62.5-25 MCG/ACT IN AEPB: 1.0000 | INHALATION_SPRAY | Freq: Every day | RESPIRATORY_TRACT | 5 refills | Status: DC

## 2022-03-07 NOTE — Patient Instructions (Addendum)
Visit Information  Following are the goals we discussed today:  Current Barriers:  Unable to independently afford treatment regimen   Pharmacist Clinical Goal(s):  patient will verbalize ability to afford treatment regimen maintain control of COPD as evidenced by IMPROVED BREATHING AND QUALITY OF LIFE  through collaboration with PharmD and provider.   Interventions: 1:1 collaboration with Janora Norlander, DO regarding development and update of comprehensive plan of care as evidenced by provider attestation and co-signature Inter-disciplinary care team collaboration (see longitudinal plan of care) Comprehensive medication review performed; medication list updated in electronic medical record  Chronic Obstructive Pulmonary Disease:  New goal. Uncontrolled-has improved on Trelegy (very expensive); current treatment: Trelegy 100;  Most recent Pulmonary Function Testing:  PFTs show severe airway obstruction CONTINUE on Trelegy per pulm Use albuterol nebs for rescue. Smoked more than 90 pack years until he quit in 2012 1 exacerbations requiring treatment in the last 6 months  Assessed patient finances. Will apply for Trelegy patient assistance; awaiting pharmacy print out   Patient Goals/Self-Care Activities patient will:  - take medications as prescribed as evidenced by patient report and record review collaborate with provider on medication access solutions   Plan: Telephone follow up appointment with care management team member scheduled for:  2 months  Signature Regina Eck, PharmD, BCPS Clinical Pharmacist, [place]  II Phone [phone]  Please call the care guide team at [phone] if you need to cancel or reschedule your appointment.   The patient verbalized understanding of instructions, educational materials, and care plan provided today and DECLINED offer to receive copy of patient instructions, educational materials,  and care plan.

## 2022-03-07 NOTE — Progress Notes (Signed)
Chronic Care Management Pharmacy Note  02/26/2022 Name:  Hector Torres MRN:  [id number] DOB:  1941-05-03  Summary:  Chronic Obstructive Pulmonary Disease:  New goal. Uncontrolled-has improved on Trelegy (very expensive); current treatment: Trelegy 100, albuterol nebs PRN;  Most recent Pulmonary Function Testing:  PFTs show severe airway obstruction CONTINUE on Trelegy per pulm Use albuterol nebs for rescue. Smoked more than 90 pack years until he quit in 2012 1 exacerbations requiring treatment in the last 6 months  Assessed patient finances. Will apply for Trelegy patient assistance; awaiting pharmacy print out   Patient Goals/Self-Care Activities patient will:  - take medications as prescribed as evidenced by patient report and record review collaborate with provider on medication access solutions   Subjective: Hector Torres is an 81 y.o. year old male who is a primary patient of Janora Norlander, DO.  The CCM team was consulted for assistance with disease management and care coordination needs.    Engaged with patient by telephone for initial visit in response to provider referral for pharmacy case management and/or care coordination services.   Consent to Services:  The patient was given information about Chronic Care Management services, agreed to services, and gave verbal consent prior to initiation of services.  Please see initial visit note for detailed documentation.   Patient Care Team: Janora Norlander, DO as PCP - General (Family Medicine) Satira Sark, MD as PCP - Cardiology (Cardiology) Cathe Mons, MD (Gastroenterology) Satira Sark, MD as Consulting Physician (Cardiology) Garald Balding, MD as Consulting Physician (Orthopedic Surgery) Festus Aloe, MD as Consulting Physician (Urology) Temas, Verdie Shire, MD as Referring Physician (Ophthalmology) Gala Romney Cristopher Estimable, MD as Consulting Physician (Gastroenterology) Lavera Guise, [facility] as [place] Management (Pharmacist)  Objective:  Lab Results  Component Value Date   CREATININE 0.96 01/28/2022   CREATININE 1.35 (H) 11/06/2021   CREATININE 1.19 08/21/2021    No results found for: "HGBA1C" Last diabetic Eye exam: No results found for: "HMDIABEYEEXA"  Last diabetic Foot exam: No results found for: "HMDIABFOOTEX"      Component Value Date/Time   CHOL 100 01/28/2022 1153   CHOL 102 12/25/2012 1251   TRIG 93 01/28/2022 1153   TRIG 82 12/25/2012 1251   HDL 40 01/28/2022 1153   HDL 36 (L) 12/25/2012 1251   CHOLHDL 2.5 01/28/2022 1153   [place] 42 01/28/2022 1153   [place] 50 12/25/2012 1251       Latest Ref Rng & Units 01/28/2022   11:53 AM 11/06/2021   11:48 AM 04/30/2021    3:11 PM  Hepatic Function  Total Protein 6.0 - 8.5 g/dL 5.7  6.0  6.1   Albumin 3.6 - 4.6 g/dL 3.6  3.3  4.1   AST 0 - 40 IU/L _0 ALT 0 - 44 IU/L _1 Alk Phosphatase 44 - 121 IU/L 60  44  60   Total Bilirubin 0.0 - 1.2 mg/dL 0.5  0.8  0.6     Lab Results  Component Value Date/Time   TSH 1.250 01/28/2022 11:53 AM   TSH 1.610 09/10/2019 11:47 AM       Latest Ref Rng & Units 01/28/2022   11:53 AM 11/06/2021   11:48 AM 08/21/2021   11:07 AM  CBC  WBC 3.4 - 10.8 x10E3/uL 6.8  9.3  6.5   Hemoglobin 13.0 - 17.7 g/dL 15.9  16.3  17.2   Hematocrit 37.5 - 51.0 % 46.3  48.7  50.1   Platelets 150 - 450 x10E3/uL 186  186  221     Lab Results  Component Value Date/Time   VD25OH 37.0 08/28/2018 01:32 PM   VD25OH 55.0 11/05/2013 11:06 AM    Clinical ASCVD: No  The ASCVD Risk score (Arnett DK, et al., 2019) failed to calculate for the following reasons:   The 2019 ASCVD risk score is only valid for ages 42 to 53    Other: (CHADS2VASc if Afib, PHQ9 if depression, MMRC or CAT for COPD, ACT, DEXA)  Social History   Tobacco Use  Smoking Status Former   Packs/day: 1.50   Years: 60.00   Total pack years: 90.00   Types: Cigarettes    Quit date: 02/27/2011   Years since quitting: 11.0  Smokeless Tobacco Never   BP Readings from Last 3 Encounters:  01/28/22 (!) 99/53  12/20/21 122/72  12/18/21 (!) 152/84   Pulse Readings from Last 3 Encounters:  01/28/22 77  12/20/21 62  12/18/21 66   Wt Readings from Last 3 Encounters:  01/28/22 192 lb 12.8 oz (87.5 kg)  12/20/21 193 lb (87.5 kg)  12/18/21 194 lb (88 kg)    Assessment: Review of patient past medical history, allergies, medications, health status, including review of consultants reports, laboratory and other test data, was performed as part of comprehensive evaluation and provision of chronic care management services.   SDOH:  (Social Determinants of Health) assessments and interventions performed:    CCM Care Plan  Allergies  Allergen Reactions   Norvasc [Amlodipine Besylate] Swelling and Other (See Comments)    LE swelling    Medications Reviewed Today     Reviewed by Lavera Guise, [facility] (Pharmacist) on 03/07/22 at 0950  Med List Status: <None>   Medication Order Taking? Sig Documenting Provider Last Dose Status Informant  albuterol (VENTOLIN HFA) 108 (90 Base) MCG/ACT inhaler [id number] No Inhale 2 puffs into the lungs every 6 (six) hours as needed for wheezing or shortness of breath. Rigoberto Noel, MD Taking Active Self  aspirin EC 81 MG tablet [id number] No Take 81 mg by mouth at bedtime. Swallow whole. [provider] Taking Active Self  carvedilol (COREG) 6.25 MG tablet [id number] No TAKE 1 TABLET TWICE DAILY WITH MEALS Gottschalk, Ashly M, DO Taking Active   cholecalciferol (VITAMIN D3) 25 MCG (1000 UNIT) tablet [id number] No Take 1,000 Units by mouth every other day. [provider] Taking Active Self  diphenhydramine-acetaminophen (TYLENOL PM) 25-500 MG TABS tablet [id number] No Take 2 tablets by mouth at bedtime as needed (sleep/pain). [provider] Taking Active Self  Fluticasone-Umeclidin-Vilant (TRELEGY ELLIPTA)  100-62.5-25 MCG/ACT AEPB [id number] No Inhale 1 puff into the lungs daily. Rigoberto Noel, MD Taking Active   hydrochlorothiazide (HYDRODIURIL) 25 MG tablet [id number] No TAKE 1 TABLET EVERY DAY Satira Sark, MD Taking Active   isosorbide mononitrate (IMDUR) 30 MG 24 hr tablet [id number] No TAKE 1 TABLET TWICE DAILY (NEED MD APPOINTMENT) Satira Sark, MD Taking Active Self  Multiple Vitamin (MULTIVITAMIN WITH MINERALS) TABS tablet [id number] No Take 1 tablet by mouth at bedtime. [provider] Taking Active Self  Multiple Vitamins-Minerals (PRESERVISION AREDS PO) [id number] No Take 1 tablet by mouth in the morning and at bedtime. [provider] Taking Active Self  rosuvastatin (CRESTOR) 20 MG tablet [id number] No TAKE 1 TABLET EVERY DAY Gottschalk, Ashly M, DO Taking Active   sodium chloride  flush (NS) 0.9 % injection 3 mL [id number]   Lorretta Harp, MD  Active   tamsulosin [facility]) 0.4 MG CAPS capsule [id number] No Take 0.4 mg by mouth daily. [provider] Taking Active             Patient Active Problem List   Diagnosis Date Noted   Renal artery stenosis ([place]) 07/31/2021   Personal history of colonic polyps    Unilateral primary osteoarthritis, left knee 02/04/2018   COPD (chronic obstructive pulmonary disease) ([place]) 08/01/2017   History of non-ST elevation myocardial infarction (NSTEMI)    History of renal stone    BPH (benign prostatic hypertrophy)    TIA (transient ischemic attack)    Prostate nodule    PVD (peripheral vascular disease) ([place])    Resistant hypertension 12/25/2012   Screening for prostate cancer 12/25/2012   Carotid stenosis, bilateral 04/03/2012   Coronary atherosclerosis of native coronary artery 04/15/2011   Mixed hyperlipidemia 04/15/2011    Immunization History  Administered Date(s) Administered   Fluad Quad(high Dose 65+) 05/31/2021, 07/30/2021   Influenza Split 06/11/2013   Influenza, High Dose Seasonal PF  06/14/2014, 06/03/2017, 05/20/2018   Influenza,inj,Quad PF,6+ Mos 05/19/2015   Influenza-Unspecified 05/31/2016, 05/27/2019, 03/30/2020   Janssen (J&J) SARS-COV-2 Vaccination 01/25/2020, 07/05/2020   Pneumococcal Conjugate-13 08/28/2018   Pneumococcal Polysaccharide-23 06/27/2008, 05/19/2015   Tdap 04/25/2006, 02/11/2018   Zoster, Live 08/10/2008    Conditions to be addressed/monitored: COPD  Care Plan : PHARMD MEDICATION MANAGEMENT  Updates made by Lavera Guise, [place] since 03/07/2022 12:00 AM     Problem: DISEASE PROGRESSION PREVENTION      Long-Range Goal: COPD   This Visit's Progress: Not on track  Note:   Current Barriers:  Unable to independently afford treatment regimen   Pharmacist Clinical Goal(s):  patient will verbalize ability to afford treatment regimen maintain control of COPD as evidenced by IMPROVED BREATHING AND QUALITY OF LIFE  through collaboration with PharmD and provider.   Interventions: 1:1 collaboration with Janora Norlander, DO regarding development and update of comprehensive plan of care as evidenced by provider attestation and co-signature Inter-disciplinary care team collaboration (see longitudinal plan of care) Comprehensive medication review performed; medication list updated in electronic medical record  Chronic Obstructive Pulmonary Disease:  New goal. Uncontrolled-has improved on Trelegy (very expensive); current treatment: Trelegy 100;  Most recent Pulmonary Function Testing:  PFTs show severe airway obstruction CONTINUE on Trelegy per pulm Use albuterol nebs for rescue. Smoked more than 90 pack years until he quit in 2012 1 exacerbations requiring treatment in the last 6 months  Assessed patient finances. Will apply for Trelegy patient assistance; awaiting pharmacy print out   Patient Goals/Self-Care Activities patient will:  - take medications as prescribed as evidenced by patient report and record review collaborate with  provider on medication access solutions      Medication Assistance: Application for trelegy  medication assistance program. in process.  Anticipated assistance start date tbd.  See plan of care for additional detail.  Plan: Telephone follow up appointment with care management team member scheduled for:  1-2 months    Regina Eck, PharmD, BCPS Clinical Pharmacist, [place]  [facility] Phone [phone]

## 2022-03-18 DIAGNOSIS — J432 Centrilobular emphysema: Secondary | ICD-10-CM

## 2022-03-18 DIAGNOSIS — E782 Mixed hyperlipidemia: Secondary | ICD-10-CM | POA: Diagnosis not present

## 2022-03-21 ENCOUNTER — Telehealth: Payer: Self-pay

## 2022-03-21 DIAGNOSIS — Z0000 Encounter for general adult medical examination without abnormal findings: Secondary | ICD-10-CM

## 2022-03-21 NOTE — Telephone Encounter (Signed)
Called patient to determine if he had return the Vivify cuff after the completion of the program or if he still had access to it. Patient stated that he returned everything. Will update Dr. Oval Linsey.   Yemariam Magar Truman Hayward, [facility] Guide, Health Coach [address], [address] [place] [address] Telephone: [phone] Email: [email or web address]

## 2022-04-17 ENCOUNTER — Other Ambulatory Visit: Payer: Self-pay | Admitting: Family Medicine

## 2022-04-17 DIAGNOSIS — E782 Mixed hyperlipidemia: Secondary | ICD-10-CM

## 2022-05-06 ENCOUNTER — Ambulatory Visit: Payer: Medicare HMO | Admitting: Pulmonary Disease

## 2022-05-07 ENCOUNTER — Other Ambulatory Visit: Payer: Self-pay | Admitting: Cardiovascular Disease

## 2022-05-07 DIAGNOSIS — I6523 Occlusion and stenosis of bilateral carotid arteries: Secondary | ICD-10-CM

## 2022-05-09 ENCOUNTER — Ambulatory Visit: Payer: Medicare HMO | Attending: Cardiovascular Disease

## 2022-05-09 DIAGNOSIS — I6523 Occlusion and stenosis of bilateral carotid arteries: Secondary | ICD-10-CM | POA: Diagnosis not present

## 2022-05-29 ENCOUNTER — Other Ambulatory Visit: Payer: Self-pay | Admitting: Family Medicine

## 2022-05-29 ENCOUNTER — Other Ambulatory Visit: Payer: Self-pay | Admitting: Cardiology

## 2022-06-03 ENCOUNTER — Other Ambulatory Visit: Payer: Self-pay | Admitting: Cardiovascular Disease

## 2022-06-03 DIAGNOSIS — I6523 Occlusion and stenosis of bilateral carotid arteries: Secondary | ICD-10-CM

## 2022-06-11 DIAGNOSIS — H353132 Nonexudative age-related macular degeneration, bilateral, intermediate dry stage: Secondary | ICD-10-CM | POA: Diagnosis not present

## 2022-06-19 ENCOUNTER — Encounter: Payer: Self-pay | Admitting: Pulmonary Disease

## 2022-06-19 ENCOUNTER — Ambulatory Visit: Payer: Medicare HMO | Admitting: Pulmonary Disease

## 2022-06-19 DIAGNOSIS — J432 Centrilobular emphysema: Secondary | ICD-10-CM

## 2022-06-19 NOTE — Patient Instructions (Signed)
  Continue on Trelegy Stay active

## 2022-06-19 NOTE — Progress Notes (Signed)
   Subjective:    Patient ID: Hector Torres, male    DOB: 1941-06-06, 81 y.o.   MRN: [id number]  HPI  81 yo for follow-up of COPD.   PMH -renal artery stenosis - neg angio CAD -PTCA to left circumflex 1995 10/2021 neurocardiogenic syncope  with PFT maneuver 'vasovagal' -left subclavian stenosis -He smoked more than 90 pack years until he quit in 2012  Chief Complaint  Patient presents with   Follow-up    Feels he is doing well since last ov. No new concerns.    3-monthfollow-up visit No complaints.  He is compliant with Trelegy.  Since he is in the donut hole, this was very expensive for him but he was able to obtain it directly from [employer] He brings in a record of his blood pressure and heart rate, most numbers are within range except for a couple about a couple of low readings.  He is on thiazide and carvedilol, he has an upcoming appointment with cardiology. He tries to stay active No interim flareups   Significant tests/ events reviewed  PFTs 10/2021 severe airway obs, ratio 39, FEV1 2.04/39% ,FVC 71%, TLC nml, DLCO 9.4/38%   CTA angiogram chest 04/2015 emphysema   CT abdomen 06/2021 minimal right lower lobe scarring  Review of Systems  neg for any significant sore throat, dysphagia, itching, sneezing, nasal congestion or excess/ purulent secretions, fever, chills, sweats, unintended wt loss, pleuritic or exertional cp, hempoptysis, orthopnea pnd or change in chronic leg swelling. Also denies presyncope, palpitations, heartburn, abdominal pain, nausea, vomiting, diarrhea or change in bowel or urinary habits, dysuria,hematuria, rash, arthralgias, visual complaints, headache, numbness weakness or ataxia.     Objective:   Physical Exam  Gen. Pleasant,elderly, in no distress ENT - no lesions, no post nasal drip Neck: No JVD, no thyromegaly, no carotid bruits Lungs: no use of accessory muscles, no dullness to percussion, decreased without rales or rhonchi  Cardiovascular:  Rhythm regular, heart sounds  normal, no murmurs or gallops, no peripheral edema Musculoskeletal: No deformities, no cyanosis or clubbing , no tremors       Assessment & Plan:

## 2022-06-19 NOTE — Assessment & Plan Note (Addendum)
Triple therapy is worked well for him and he will continue on Trelegy. He does have albuterol for rescue but does not feel like this helps him. We discussed COPD action plan and signs and symptoms of COPD exacerbation  I encouraged him to stay active, he does not want to go to a pulmonary rehab center He has received a flu shot.  I advised RSV vaccination

## 2022-07-01 NOTE — Progress Notes (Unsigned)
Cardiology Office Note  Date: 07/02/2022   ID: Aadit, Hagood May 15, 1941, MRN [id number]  PCP:  Janora Norlander, DO  Cardiologist:  Rozann Lesches, MD Electrophysiologist:  None   Chief Complaint  Patient presents with   Cardiac follow-up    History of Present Illness: Hector Torres is an 81 y.o. male last seen in May.  He is here for a routine visit.  Reports no angina or nitroglycerin use.  Stable NYHA class II dyspnea, no palpitations or syncope.  I reviewed home blood pressure measurements, overall showing reasonable control with fluctuations on the low and high end occasionally.  I reviewed his medications, he reports compliance with therapy.  His last LDL was 42 back in June.  He also had recent follow-up carotid Dopplers as noted below.  Past Medical History:  Diagnosis Date   Arthritis    BPH (benign prostatic hypertrophy)    Carotid artery disease (HCC)    Colon polyp    COPD (chronic obstructive pulmonary disease) (HCC)    Coronary atherosclerosis of native coronary artery    PTCA circumflex at [facility], nonobstructive residual disease at catheterization June and October 2016   Essential hypertension    GERD (gastroesophageal reflux disease)    History of nephrolithiasis    Mixed hyperlipidemia    Prostate nodule    PVD (peripheral vascular disease) (HCC)    TIA (transient ischemic attack)    Vitamin D deficiency     Past Surgical History:  Procedure Laterality Date   ABDOMINAL AORTOGRAM W/LOWER EXTREMITY N/A 08/27/2021   Procedure: ABDOMINAL AORTOGRAM W/LOWER EXTREMITY;  Surgeon: Lorretta Harp, MD;  Location: [place] [facility];  Service: Cardiovascular;  Laterality: N/A;   ANGIOPLASTY Left 1995   Cir. Artery   APPENDECTOMY  1947   CARDIAC CATHETERIZATION N/A 02/03/2015   Procedure: Left Heart Cath and Cors/Grafts Angiography;  Surgeon: Sherren Mocha, MD;  Location: [place] [facility];  Service: Cardiovascular;  Laterality: N/A;    CARDIAC CATHETERIZATION N/A 05/19/2015   Procedure: Left Heart Cath and Coronary Angiography;  Surgeon: Troy Sine, MD;  Location: [place] [facility];  Service: Cardiovascular;  Laterality: N/A;   COLONOSCOPY N/A 03/19/2018   Procedure: COLONOSCOPY;  Surgeon: Danie Binder, MD;  [facility];  Service: Endoscopy;  Laterality: N/A;  12:00   EYE SURGERY     Bilateral cataract   POLYPECTOMY  03/19/2018   Procedure: POLYPECTOMY;  Surgeon: Danie Binder, MD;  [facility];  Service: Endoscopy;;   TONSILLECTOMY  1968   Traumatic injury  1962   Lost tip of index, middle, and ring finger/ right hand    Current Outpatient Medications  Medication Sig Dispense Refill   albuterol (VENTOLIN HFA) 108 (90 Base) MCG/ACT inhaler Inhale 2 puffs into the lungs every 6 (six) hours as needed for wheezing or shortness of breath. 8 g 6   aspirin EC 81 MG tablet Take 81 mg by mouth at bedtime. Swallow whole.     carvedilol (COREG) 6.25 MG tablet TAKE 1 TABLET TWICE DAILY WITH MEALS 180 tablet 0   cholecalciferol (VITAMIN D3) 25 MCG (1000 UNIT) tablet Take 1,000 Units by mouth every other day.     diphenhydramine-acetaminophen (TYLENOL PM) 25-500 MG TABS tablet Take 2 tablets by mouth at bedtime as needed (sleep/pain).     Fluticasone-Umeclidin-Vilant (TRELEGY ELLIPTA) 100-62.5-25 MCG/ACT AEPB Inhale 1 puff into the lungs daily. 180 each 5   hydrochlorothiazide (HYDRODIURIL)  25 MG tablet TAKE 1 TABLET EVERY DAY 90 tablet 1   isosorbide mononitrate (IMDUR) 30 MG 24 hr tablet TAKE 1 TABLET TWICE DAILY (NEED MD APPOINTMENT) 180 tablet 1   Multiple Vitamin (MULTIVITAMIN WITH MINERALS) TABS tablet Take 1 tablet by mouth at bedtime.     Multiple Vitamins-Minerals (PRESERVISION AREDS PO) Take 1 tablet by mouth in the morning and at bedtime.     rosuvastatin (CRESTOR) 20 MG tablet TAKE 1 TABLET EVERY DAY 90 tablet 1   tamsulosin (FLOMAX) 0.4 MG CAPS capsule Take 0.4 mg by mouth daily.     Current  Facility-Administered Medications  Medication Dose Route Frequency Provider Last Rate Last Admin   sodium chloride flush (NS) 0.9 % injection 3 mL  3 mL Intravenous Q12H Lorretta Harp, MD       Allergies:  Norvasc [amlodipine besylate]   ROS: No syncope.  Physical Exam: VS:  BP 128/72   Pulse 78   Ht '5\' 10"'$  (1.778 m)   Wt 191 lb 6.4 oz (86.8 kg)   SpO2 94%   BMI 27.46 kg/m , BMI Body mass index is 27.46 kg/m.  Wt Readings from Last 3 Encounters:  07/02/22 191 lb 6.4 oz (86.8 kg)  06/19/22 188 lb 9.6 oz (85.5 kg)  01/28/22 192 lb 12.8 oz (87.5 kg)    General: Patient appears comfortable at rest. HEENT: Conjunctiva and lids normal. Neck: Supple, no elevated JVP or carotid bruits, no thyromegaly. Lungs: Clear to auscultation, nonlabored breathing at rest. Cardiac: Regular rate and rhythm, no Z5,6/3 systolic murmur. Extremities: No pitting edema.  ECG:  An ECG dated 11/07/2021 was personally reviewed today and demonstrated:  Sinus rhythm with leftward axis, poor R wave progression, nonspecific ST changes.  Recent Labwork: 01/28/2022: ALT 14; AST 18; BUN 17; Creatinine, Ser 0.96; Hemoglobin 15.9; Platelets 186; Potassium 3.7; Sodium 142; TSH 1.250     Component Value Date/Time   CHOL 100 01/28/2022 1153   CHOL 102 12/25/2012 1251   TRIG 93 01/28/2022 1153   TRIG 82 12/25/2012 1251   HDL 40 01/28/2022 1153   HDL 36 (L) 12/25/2012 1251   CHOLHDL 2.5 01/28/2022 1153   LDLCALC 42 01/28/2022 1153   LDLCALC 50 12/25/2012 1251    Other Studies Reviewed Today:  Carotid Dopplers 05/09/2022: Summary:  Right Carotid: Velocities in the right ICA are consistent with a 1-39%  stenosis.                Non-hemodynamically significant plaque <50% noted in the  CCA.   Left Carotid: Velocities in the left ICA are consistent with a 40-59%  stenosis.               Non-hemodynamically significant plaque <50% noted in the  CCA.   Vertebrals: Right vertebral artery demonstrates  antegrade flow. Left  vertebral              artery demonstrates bidirectional flow.  Subclavians: Left subclavian artery was stenotic. Normal flow hemodynamics  were              seen in the right subclavian artery.   Echocardiogram 07/10/2021:  1. Left ventricular ejection fraction, by estimation, is 55 to 60%. The  left ventricle has normal function. The left ventricle demonstrates  regional wall motion abnormalities (see scoring diagram/findings for  description). There is mild left ventricular   hypertrophy. Left ventricular diastolic parameters are indeterminate.   2. Right ventricular systolic function is normal. The right ventricular  size is normal. There is normal pulmonary artery systolic pressure. The  estimated right ventricular systolic pressure is 22.4 mmHg.   3. The mitral valve is grossly normal. Mild mitral valve regurgitation.   4. The aortic valve is tricuspid. Aortic valve regurgitation is mild.  Aortic valve sclerosis is present, with no evidence of aortic valve  stenosis. Aortic regurgitation PHT measures 756 msec.   5. The inferior vena cava is normal in size with greater than 50%  respiratory variability, suggesting right atrial pressure of 3 mmHg.   Renal artery angiogram 08/27/2021: Angiographic Data:    1: Abdominal aortogram-renal arteries were patent.  There was mild infrarenal abdominal aortic atherosclerosis.  The iliac arteries were patent as well. 2: Left renal artery-30% ostial left renal artery stenosis without a pullback gradient 3: Right renal artery-30% ostial right renal artery stenosis without a pullback gradient   IMPRESSION: Mr. Norrod has mild bilateral renal artery stenosis with resistant hypertension.  I suspect that his abdominal CTA overcall the degree of renal artery stenosis and that his renal Doppler studies were correct and suggesting that there was no renal artery stenosis.  Continue medical therapy will be recommended.  Patient will be  discharged home this morning as an outpatient and will follow up with Dr. Oval Linsey.  He left the lab in stable condition.  Assessment and Plan:  1.  CAD with remote history of angioplasty to the circumflex in the 1990s with subsequently documented nonobstructive disease and plan for medical therapy over time in the absence of accelerating angina.  Continue aspirin, Coreg, Imdur, and Crestor.  Last LDL was 42.  2.  Essential hypertension.  Following blood pressure in the right arm given history of left subclavian artery stenosis.  No changes made in current regimen.  Systolic in the 825O today.  3.  Carotid artery disease, asymptomatic, mild to moderate by recent Dopplers.  Continue aspirin and Crestor.  Medication Adjustments/Labs and Tests Ordered: Current medicines are reviewed at length with the patient today.  Concerns regarding medicines are outlined above.   Tests Ordered: No orders of the defined types were placed in this encounter.   Medication Changes: No orders of the defined types were placed in this encounter.   Disposition:  Follow up  6 months.  Signed, Satira Sark, MD, [facility] 07/02/2022 11:19 AM    [place] [facility] [place], [place], [place] [address] Phone: [phone]; Fax: [phone]

## 2022-07-02 ENCOUNTER — Encounter: Payer: Self-pay | Admitting: Cardiology

## 2022-07-02 ENCOUNTER — Ambulatory Visit: Payer: Medicare HMO | Attending: Cardiology | Admitting: Cardiology

## 2022-07-02 VITALS — BP 128/72 | HR 78 | Ht 70.0 in | Wt 191.4 lb

## 2022-07-02 DIAGNOSIS — I10 Essential (primary) hypertension: Secondary | ICD-10-CM | POA: Diagnosis not present

## 2022-07-02 DIAGNOSIS — I25119 Atherosclerotic heart disease of native coronary artery with unspecified angina pectoris: Secondary | ICD-10-CM | POA: Diagnosis not present

## 2022-07-02 DIAGNOSIS — E782 Mixed hyperlipidemia: Secondary | ICD-10-CM

## 2022-07-02 NOTE — Patient Instructions (Addendum)

## 2022-08-07 ENCOUNTER — Ambulatory Visit: Payer: Medicare HMO | Admitting: Family Medicine

## 2022-08-13 ENCOUNTER — Ambulatory Visit (INDEPENDENT_AMBULATORY_CARE_PROVIDER_SITE_OTHER): Payer: Medicare HMO | Admitting: Family Medicine

## 2022-08-13 ENCOUNTER — Encounter: Payer: Self-pay | Admitting: Family Medicine

## 2022-08-13 VITALS — BP 126/59 | HR 61 | Temp 97.7°F | Ht 70.0 in | Wt 191.4 lb

## 2022-08-13 DIAGNOSIS — I25118 Atherosclerotic heart disease of native coronary artery with other forms of angina pectoris: Secondary | ICD-10-CM

## 2022-08-13 DIAGNOSIS — I10 Essential (primary) hypertension: Secondary | ICD-10-CM | POA: Diagnosis not present

## 2022-08-13 DIAGNOSIS — R350 Frequency of micturition: Secondary | ICD-10-CM

## 2022-08-13 DIAGNOSIS — H60543 Acute eczematoid otitis externa, bilateral: Secondary | ICD-10-CM | POA: Diagnosis not present

## 2022-08-13 DIAGNOSIS — L570 Actinic keratosis: Secondary | ICD-10-CM | POA: Diagnosis not present

## 2022-08-13 DIAGNOSIS — J432 Centrilobular emphysema: Secondary | ICD-10-CM

## 2022-08-13 NOTE — Patient Instructions (Signed)
Debrox to help dried down ear wax Ok to use Cortisone cream applied with tip of finger to ear canals as needed for itching We burned the spot on your earlobe. If it doesn't go away recommend evaluation with Dermatology.  They may have to perform something called Moh's to rid you of it.  Cryoablation, Care After The following information offers guidance on how to care for yourself after your procedure. Your health care provider may also give you more specific instructions. If you have problems or questions, contact your health care provider. What can I expect after the procedure? After the procedure, it is common to have: Redness or blisters near the area treated. Mild pain and swelling. Follow these instructions at home: Treatment area care  If you have an incision, follow instructions from your health care provider about how to take care of it. Make sure you: Wash your hands with soap and water for at least 20 seconds before and after you change your bandage (dressing). If soap and water are not available, use hand sanitizer. Change your dressing as told by your health care provider. Leave stitches (sutures), skin glue, or adhesive strips in place. These skin closures may need to stay in place for 2 weeks or longer. If adhesive strip edges start to loosen and curl up, you may trim the loose edges. Do not remove adhesive strips completely unless your health care provider tells you to do that. Check your treatment area every day for signs of infection. Check for: More redness, swelling, or pain. Fluid or blood. Warmth. Pus or a bad smell. Keep the treated area clean and dry. Keep it covered with a dressing until it has healed. Clean the area with soap and water as told by your health care provider. If your dressing gets wet, change it right away. Activity  Follow instructions from your health care provider about what activities are safe for you. You may have to avoid lifting. Ask your health  care provider how much you can safely lift. If you were given a sedative during the procedure, it can affect you for several hours. Do not drive or operate machinery until your health care provider says that it is safe. General instructions Take over-the-counter and prescription medicines only as told by your health care provider. Do not use any products that contain nicotine or tobacco. These products include cigarettes, chewing tobacco, and vaping devices, such as e-cigarettes. These can delay incision healing. If you need help quitting, ask your health care provider. Do not take baths, swim, or use a hot tub until your health care provider approves. Ask your health care provider if you may take showers. You may only be allowed to take sponge baths. Keep all follow-up visits. Your health care provider may need to check that treatment worked and that there were no problems caused by the procedure. Contact a health care provider if: You have more pain. You have a fever. You have nausea or vomiting. You have any signs of infection. You do not have a bowel movement for 2 days. You cannot urinate, or you cannot control when you urinate or have a bowel movement (have incontinence). You develop impotence. Get help right away if: You have severe pain. You have trouble swallowing or breathing. You are very weak or dizzy. You have chest pain or shortness of breath. These symptoms may be an emergency. Get help right away. Call 911. Do not wait to see if the symptoms will go away. Do not  drive yourself to the hospital. This information is not intended to replace advice given to you by your health care provider. Make sure you discuss any questions you have with your health care provider. Document Revised: 01/18/2022 Document Reviewed: 01/18/2022 Elsevier Patient Education  [place].

## 2022-08-13 NOTE — Progress Notes (Signed)
Subjective: CC:HTN, HLD, CAD, Emphysema PCP: Janora Norlander, DO HQP:[id number] Hector Torres is a 81 y.o. male presenting to clinic today for:  1. HTN associated with HLD, CAD Reports compliance with all medications.  Average blood pressures run around 110s to 120s over 70s to 80s.  Occasionally does have a low blood pressure into the 90s.  He does not feel dizzy.  No chest pain or shortness of breath reported.  He has been doing really well on his Trelegy see below.  2. Pulmonary emphysema Now getting Trelegy from patient assistance program.  Has 62-monthfollow-up with Dr. AElsworth Soho his pulmonologist.  He is very pleased with how things are going.  3.  Skin lesion Patient reports a crusty skin lesion on the left ear that scratches off but then always recurs.  No spontaneous bleeding.  Has required cryoablation in the past and wants to know if we can take care of that today  4.  Ear irritation Patient reports bilateral ear irritation.  Has used some hand sanitizer in efforts to alleviate symptoms.   ROS: Per HPI  Allergies  Allergen Reactions   Norvasc [Amlodipine Besylate] Swelling and Other (See Comments)    LE swelling   Past Medical History:  Diagnosis Date   Arthritis    BPH (benign prostatic hypertrophy)    Carotid artery disease (HCC)    Colon polyp    COPD (chronic obstructive pulmonary disease) (HCC)    Coronary atherosclerosis of native coronary artery    [facility]flex [facility] nonobstructive residual disease at catheterization June and October 2016   Essential hypertension    GERD (gastroesophageal reflux disease)    History of nephrolithiasis    Mixed hyperlipidemia    Prostate nodule    PVD (peripheral vascular disease) (HCC)    TIA (transient ischemic attack)    Vitamin D deficiency     Current Outpatient Medications:    albuterol (VENTOLIN HFA) 108 (90 Base) MCG/ACT inhaler, Inhale 2 puffs into the lungs every 6 (six) hours as needed for wheezing or  shortness of breath., Disp: 8 g, Rfl: 6   aspirin EC 81 MG tablet, Take 81 mg by mouth at bedtime. Swallow whole., Disp: , Rfl:    carvedilol (COREG) 6.25 MG tablet, TAKE 1 TABLET TWICE DAILY WITH MEALS, Disp: 180 tablet, Rfl: 0   cholecalciferol (VITAMIN D3) 25 MCG (1000 UNIT) tablet, Take 1,000 Units by mouth every other day., Disp: , Rfl:    diphenhydramine-acetaminophen (TYLENOL PM) 25-500 MG TABS tablet, Take 2 tablets by mouth at bedtime as needed (sleep/pain)., Disp: , Rfl:    Fluticasone-Umeclidin-Vilant (TRELEGY ELLIPTA) 100-62.5-25 MCG/ACT AEPB, Inhale 1 puff into the lungs daily., Disp: 180 each, Rfl: 5   hydrochlorothiazide (HYDRODIURIL) 25 MG tablet, TAKE 1 TABLET EVERY DAY, Disp: 90 tablet, Rfl: 1   isosorbide mononitrate (IMDUR) 30 MG 24 hr tablet, TAKE 1 TABLET TWICE DAILY (NEED MD APPOINTMENT), Disp: 180 tablet, Rfl: 1   Multiple Vitamin (MULTIVITAMIN WITH MINERALS) TABS tablet, Take 1 tablet by mouth at bedtime., Disp: , Rfl:    Multiple Vitamins-Minerals (PRESERVISION AREDS PO), Take 1 tablet by mouth in the morning and at bedtime., Disp: , Rfl:    rosuvastatin (CRESTOR) 20 MG tablet, TAKE 1 TABLET EVERY DAY, Disp: 90 tablet, Rfl: 1   tamsulosin (FLOMAX) 0.4 MG CAPS capsule, Take 0.4 mg by mouth daily., Disp: , Rfl:   Current Facility-Administered Medications:    sodium chloride flush (NS) 0.9 % injection 3 mL,  3 mL, Intravenous, Q12H, Lorretta Harp, MD Social History   Socioeconomic History   Marital status: Widowed    Spouse name: Not on file   Number of children: Not on file   Years of education: Not on file   Highest education level: Not on file  Occupational History   Not on file  Tobacco Use   Smoking status: Former    Packs/day: 1.50    Years: 60.00    Total pack years: 90.00    Types: Cigarettes    Quit date: 02/27/2011    Years since quitting: 11.4   Smokeless tobacco: Never  Vaping Use   Vaping Use: Never used  Substance and Sexual Activity    Alcohol use: No   Drug use: No   Sexual activity: Not on file  Other Topics Concern   Not on file  Social History Narrative   Not on file   Social Determinants of Health   Financial Resource Strain: Low Risk  (12/27/2020)   Overall Financial Resource Strain (CARDIA)    Difficulty of Paying Living Expenses: Not hard at all  Food Insecurity: No Food Insecurity (12/27/2020)   Hunger Vital Sign    Worried About Running Out of Food in the Last Year: Never true    [place] in the Last Year: Never true  Transportation Needs: No Transportation Needs (12/27/2020)   PRAPARE - Hydrologist (Medical): No    Lack of Transportation (Non-Medical): No  Physical Activity: Inactive (12/27/2020)   Exercise Vital Sign    Days of Exercise per Week: 0 days    Minutes of Exercise per Session: 0 min  Stress: No Stress Concern Present (12/27/2020)   [place]    Feeling of Stress : Only a little  Social Connections: Not on file  Intimate Partner Violence: Not on file   Family History  Problem Relation Age of Onset   Gastric cancer Mother    Varicose Veins Mother    Cancer Mother        Stomach   Prostate cancer Father    Cancer Father        Prostate   Other Brother        carotid artery stenosis   Diabetes Brother    Leukemia Brother    Hyperlipidemia Brother    Hypertension Brother    Colon cancer Brother    Hyperlipidemia Brother    Peripheral vascular disease Brother    Cancer Brother        Colon and Prostate   Hypertension Sister    Hyperlipidemia Sister    Diabetes Daughter    Diabetes Son    Prostate cancer Brother     Objective: Office vital signs reviewed. BP (!) 126/59   Pulse 61   Temp 97.7 F (36.5 C)   Ht '5\' 10"'$  (1.778 m)   Wt 191 lb 6.4 oz (86.8 kg)   SpO2 93%   BMI 27.46 kg/m   Physical Examination:  General: Awake, alert, well nourished, No acute  distress HEENT: TMs intact bilaterally.  He has some scant dried cerumen noted on the external auditory canal bilaterally Cardio: regular rate and rhythm, S1S2 heard, no murmurs appreciated Pulm: Globally decreased breath sounds with normal work of breathing on room air.  No wheezing Skin: Scaly, hyperkeratotic lesion noted along the left earlobe and left helix.  These were nonbleeding and not ulcerated  Cryotherapy  Procedure:  Risks and benefits of procedure were reviewed with the patient.  Written consent obtained and scanned into the chart.  Lesion of concern was identified and located on left ear lobe and left helix of ear.  Liquid nitrogen was applied to area of concern and extending out 1 millimeters beyond the border of the lesion.  Treated area was allowed to come back to room temperature before treating it a second time.  Patient tolerated procedure well and there were no immediate complications.  Home care instructions were reviewed with the patient and a handout was provided.  Assessment/ Plan: 81 y.o. male   Essential hypertension  Dermatitis of both ear canals  Actinic keratoses  Atherosclerosis of native coronary artery of native heart with stable angina pectoris (HCC)  Centrilobular emphysema (HCC)  Urinary frequency - Plan: PSA  Blood pressure is controlled.  No changes.  We discussed orthostatic potential with Flomax.  Caution use.  May need to consider switching to Rapaflo if continues to have issues  Discussed use of topical corticosteroid in external ears if needed for irritation.  Also discussed use of Debrox for dry cerumen  Actinic keratosis was treated with cryoablation today.  We discussed that if this does not resolve issue, low threshold to refer to dermatology  Continue to follow-up with cardiology as directed.  Continue follow-up with pulmonology as directed.  He will need to follow-up with clinical pharmacy sometime in the spring to update patient  assistance form for Trelegy  Check PSA.  CC to primary urologist.  No orders of the defined types were placed in this encounter.  No orders of the defined types were placed in this encounter.    Janora Norlander, DO [place] [phone]

## 2022-08-14 LAB — PSA: Prostate Specific Ag, Serum: 1.4 ng/mL (ref 0.0–4.0)

## 2022-11-11 DIAGNOSIS — R3914 Feeling of incomplete bladder emptying: Secondary | ICD-10-CM | POA: Diagnosis not present

## 2022-11-11 DIAGNOSIS — N200 Calculus of kidney: Secondary | ICD-10-CM | POA: Diagnosis not present

## 2022-11-11 DIAGNOSIS — N401 Enlarged prostate with lower urinary tract symptoms: Secondary | ICD-10-CM | POA: Diagnosis not present

## 2022-11-11 DIAGNOSIS — R300 Dysuria: Secondary | ICD-10-CM | POA: Diagnosis not present

## 2022-12-09 ENCOUNTER — Other Ambulatory Visit: Payer: Self-pay

## 2022-12-09 ENCOUNTER — Telehealth (INDEPENDENT_AMBULATORY_CARE_PROVIDER_SITE_OTHER): Payer: Medicare HMO | Admitting: Family

## 2022-12-09 ENCOUNTER — Telehealth: Payer: Self-pay | Admitting: Family Medicine

## 2022-12-09 ENCOUNTER — Encounter: Payer: Self-pay | Admitting: Family

## 2022-12-09 ENCOUNTER — Other Ambulatory Visit: Payer: Medicare HMO

## 2022-12-09 DIAGNOSIS — R399 Unspecified symptoms and signs involving the genitourinary system: Secondary | ICD-10-CM | POA: Diagnosis not present

## 2022-12-09 DIAGNOSIS — R300 Dysuria: Secondary | ICD-10-CM | POA: Diagnosis not present

## 2022-12-09 LAB — URINALYSIS, COMPLETE
Bilirubin, UA: NEGATIVE
Glucose, UA: NEGATIVE
Ketones, UA: NEGATIVE
Nitrite, UA: NEGATIVE
Protein,UA: NEGATIVE
RBC, UA: NEGATIVE
Specific Gravity, UA: 1.015 (ref 1.005–1.030)
Urobilinogen, Ur: 0.2 mg/dL (ref 0.2–1.0)
pH, UA: 7 (ref 5.0–7.5)

## 2022-12-09 LAB — MICROSCOPIC EXAMINATION: RBC, Urine: NONE SEEN /hpf (ref 0–2)

## 2022-12-09 MED ORDER — CEPHALEXIN 500 MG PO CAPS: 500.0000 mg | ORAL_CAPSULE | Freq: Two times a day (BID) | ORAL | 0 refills | Status: DC

## 2022-12-09 NOTE — Telephone Encounter (Signed)
ok 

## 2022-12-09 NOTE — Telephone Encounter (Signed)
Patient calling back to let Lendon Colonel know that the medication his urologist called in was Doxycycline HYC 100 mg

## 2022-12-09 NOTE — Progress Notes (Signed)
Virtual Visit Consent   Hector Torres, you are scheduled for a virtual visit with a [facility] provider today. Just as with appointments in the office, your consent must be obtained to participate. Your consent will be active for this visit and any virtual visit you may have with one of our providers in the next 365 days. If you have a MyChart account, a copy of this consent can be sent to you electronically.  As this is a virtual visit, video technology does not allow for your provider to perform a traditional examination. This may limit your provider's ability to fully assess your condition. If your provider identifies any concerns that need to be evaluated in person or the need to arrange testing (such as labs, EKG, etc.), we will make arrangements to do so. Although advances in technology are sophisticated, we cannot ensure that it will always work on either your end or our end. If the connection with a video visit is poor, the visit may have to be switched to a telephone visit. With either a video or telephone visit, we are not always able to ensure that we have a secure connection.  By engaging in this virtual visit, you consent to the provision of healthcare and authorize for your insurance to be billed (if applicable) for the services provided during this visit. Depending on your insurance coverage, you may receive a charge related to this service.  I need to obtain your verbal consent now. Are you willing to proceed with your visit today? Hector Torres has provided verbal consent on 12/09/2022 for a virtual visit (video or telephone). Hector Rodney, FNP  Date: 12/09/2022 3:16 PM  Virtual Visit via Video Note   I, Hector Torres, connected with  Hector Torres  ([id number], 83-Apr-1942) on 12/09/22 at  3:10 PM EDT by a video-enabled telemedicine application and verified that I am speaking with the correct person using two identifiers.  Location: Patient: Virtual Visit Location Patient:  Other: car Provider: Virtual Visit Location Provider: Office/Clinic   I discussed the limitations of evaluation and management by telemedicine and the availability of in person appointments. The patient expressed understanding and agreed to proceed.    History of Present Illness: Hector Torres is a 82 y.o. who identifies as a male who was assigned male at birth, and is being seen today for dysuria that started a few weeks ago. He is followed by Urologists.   HPI: Dysuria  This is a new problem. The current episode started 1 to 4 weeks ago. The problem occurs intermittently. The problem has been waxing and waning. The quality of the pain is described as burning. The pain is at a severity of 2/10. The pain is mild. There has been no fever. Associated symptoms include urgency. Pertinent negatives include no flank pain, frequency, hematuria, hesitancy, nausea or vomiting. He has tried antibiotics and increased fluids for the symptoms. The treatment provided mild relief.    Problems:  Patient Active Problem List   Diagnosis Date Noted   Renal artery stenosis 07/31/2021   Personal history of colonic polyps    Unilateral primary osteoarthritis, left knee 02/04/2018   COPD (chronic obstructive pulmonary disease) 08/01/2017   History of non-ST elevation myocardial infarction (NSTEMI)    History of renal stone    BPH (benign prostatic hypertrophy)    TIA (transient ischemic attack)    Prostate nodule    PVD (peripheral vascular disease) (HCC)    Resistant hypertension 12/25/2012  Screening for prostate cancer 12/25/2012   Carotid stenosis, bilateral 04/03/2012   Coronary atherosclerosis of native coronary artery 04/15/2011   Mixed hyperlipidemia 04/15/2011    Allergies:  Allergies  Allergen Reactions   Norvasc [Amlodipine Besylate] Swelling and Other (See Comments)    LE swelling   Medications:  Current Outpatient Medications:    cephALEXin (KEFLEX) 500 MG capsule, Take 1 capsule (500  mg total) by mouth 2 (two) times daily., Disp: 14 capsule, Rfl: 0   albuterol (VENTOLIN HFA) 108 (90 Base) MCG/ACT inhaler, Inhale 2 puffs into the lungs every 6 (six) hours as needed for wheezing or shortness of breath., Disp: 8 g, Rfl: 6   aspirin EC 81 MG tablet, Take 81 mg by mouth at bedtime. Swallow whole., Disp: , Rfl:    carvedilol (COREG) 6.25 MG tablet, TAKE 1 TABLET TWICE DAILY WITH MEALS, Disp: 180 tablet, Rfl: 0   cholecalciferol (VITAMIN D3) 25 MCG (1000 UNIT) tablet, Take 1,000 Units by mouth every other day., Disp: , Rfl:    diphenhydramine-acetaminophen (TYLENOL PM) 25-500 MG TABS tablet, Take 2 tablets by mouth at bedtime as needed (sleep/pain)., Disp: , Rfl:    Fluticasone-Umeclidin-Vilant (TRELEGY ELLIPTA) 100-62.5-25 MCG/ACT AEPB, Inhale 1 puff into the lungs daily., Disp: 180 each, Rfl: 5   hydrochlorothiazide (HYDRODIURIL) 25 MG tablet, TAKE 1 TABLET EVERY DAY, Disp: 90 tablet, Rfl: 1   isosorbide mononitrate (IMDUR) 30 MG 24 hr tablet, TAKE 1 TABLET TWICE DAILY (NEED MD APPOINTMENT), Disp: 180 tablet, Rfl: 1   Multiple Vitamin (MULTIVITAMIN WITH MINERALS) TABS tablet, Take 1 tablet by mouth at bedtime., Disp: , Rfl:    Multiple Vitamins-Minerals (PRESERVISION AREDS PO), Take 1 tablet by mouth in the morning and at bedtime., Disp: , Rfl:    rosuvastatin (CRESTOR) 20 MG tablet, TAKE 1 TABLET EVERY DAY, Disp: 90 tablet, Rfl: 1   tamsulosin (FLOMAX) 0.4 MG CAPS capsule, Take 0.4 mg by mouth daily., Disp: , Rfl:   Current Facility-Administered Medications:    sodium chloride flush (NS) 0.9 % injection 3 mL, 3 mL, Intravenous, Q12H, Hector Gess, MD  Observations/Objective: Patient is well-developed, well-nourished in no acute distress.  Resting comfortably   Head is normocephalic, atraumatic.  No labored breathing.  Speech is clear and coherent with logical content.  Patient is alert and oriented at baseline.    Assessment and Plan: 1. UTI symptoms - cephALEXin  (KEFLEX) 500 MG capsule; Take 1 capsule (500 mg total) by mouth 2 (two) times daily.  Dispense: 14 capsule; Refill: 0  Force fluids AZO over the counter X2 days RTO prn Culture pending Follow up if symptoms worsen or do not improve   Follow Up Instructions: I discussed the assessment and treatment plan with the patient. The patient was provided an opportunity to ask questions and all were answered. The patient agreed with the plan and demonstrated an understanding of the instructions.  A copy of instructions were sent to the patient via MyChart unless otherwise noted below.     The patient was advised to call back or seek an in-person evaluation if the symptoms worsen or if the condition fails to improve as anticipated.  Time:  I spent 9 minutes with the patient via telehealth technology discussing the above problems/concerns.    Hector Rodney, FNP

## 2022-12-10 ENCOUNTER — Telehealth: Payer: Medicare HMO

## 2022-12-11 LAB — URINE CULTURE

## 2023-01-02 ENCOUNTER — Encounter: Payer: Self-pay | Admitting: Cardiology

## 2023-01-02 ENCOUNTER — Ambulatory Visit: Payer: Medicare HMO | Attending: Cardiology | Admitting: Cardiology

## 2023-01-02 VITALS — BP 138/80 | HR 77 | Ht 70.0 in | Wt 191.6 lb

## 2023-01-02 DIAGNOSIS — I10 Essential (primary) hypertension: Secondary | ICD-10-CM

## 2023-01-02 DIAGNOSIS — E782 Mixed hyperlipidemia: Secondary | ICD-10-CM

## 2023-01-02 DIAGNOSIS — I25119 Atherosclerotic heart disease of native coronary artery with unspecified angina pectoris: Secondary | ICD-10-CM | POA: Diagnosis not present

## 2023-01-02 NOTE — Progress Notes (Signed)
    Cardiology Office Note  Date: 01/02/2023   ID: Hector Torres, Hector Torres 1941/03/26, MRN [id number]  History of Present Illness: Hector Torres is an 82 y.o. male last seen in November 2023.  He is here for a follow-up visit.  States that he continues to do well, no active angina, NYHA class II dyspnea.  Still enjoys playing golf, doing outside chores.  No definite change in stamina.  ECG today shows sinus rhythm with PVCs.  He reports no palpitations or syncope.  I reviewed his medications which are stable from a cardiac perspective.  He has done well on Crestor 20 mg daily, last LDL 42.  Physical Exam: VS:  BP 138/80   Pulse 77   Ht 5\' 10"  (1.778 m)   Wt 191 lb 9.6 oz (86.9 kg)   SpO2 93%   BMI 27.49 kg/m , BMI Body mass index is 27.49 kg/m.  Wt Readings from Last 3 Encounters:  01/02/23 191 lb 9.6 oz (86.9 kg)  08/13/22 191 lb 6.4 oz (86.8 kg)  07/02/22 191 lb 6.4 oz (86.8 kg)    General: Patient appears comfortable at rest. HEENT: Conjunctiva and lids normal. Neck: Supple, no elevated JVP or carotid bruits. Lungs: Clear to auscultation, nonlabored breathing at rest. Cardiac: Regular rate and rhythm, no S3, 1/6 systolic murmur. Extremities: No pitting edema.  ECG:  An ECG dated 11/07/2021 was personally reviewed today and demonstrated:  Sinus rhythm with leftward axis, poor R wave progression, nonspecific ST changes.  Labwork: 01/28/2022: ALT 14; AST 18; BUN 17; Creatinine, Ser 0.96; Hemoglobin 15.9; Platelets 186; Potassium 3.7; Sodium 142; TSH 1.250     Component Value Date/Time   CHOL 100 01/28/2022 1153   CHOL 102 12/25/2012 1251   TRIG 93 01/28/2022 1153   TRIG 82 12/25/2012 1251   HDL 40 01/28/2022 1153   HDL 36 (L) 12/25/2012 1251   CHOLHDL 2.5 01/28/2022 1153   LDLCALC 42 01/28/2022 1153   LDLCALC 50 12/25/2012 1251   Other Studies Reviewed Today:   Assessment and Plan:  1.  CAD status post angioplasty of the circumflex at [facility] back in 1995 with  nonobstructive disease documented at follow-up cardiac catheterization in October 2016.  LVEF 55 to 60% by echocardiogram in November 2022.  ECG reviewed.  PVCs noted, he does not describe any palpitations or syncope.  No angina at this time on medical therapy and good functional capacity described.  Continue observation on aspirin, Coreg, Imdur, and Crestor.  2.  Essential hypertension.  He has left subclavian artery stenosis, following blood pressure in right arm.  No changes made to present regimen with systolic in the 130s.  3.  Carotid artery disease.  Carotid Dopplers in September 2023 revealed 1 to 39% RICA stenosis and 40 to 59% LICA stenosis.  He remains asymptomatic and will have follow-up carotid Dopplers in September of this year.  4.  Mixed hyperlipidemia.  LDL 42 in June 2023.  Continue Crestor 20 mg daily.  Disposition:  Follow up  6 months.  Signed, Jonelle Sidle, M.D., F.A.C.C. [facility]

## 2023-01-02 NOTE — Patient Instructions (Addendum)

## 2023-01-03 ENCOUNTER — Ambulatory Visit: Payer: Medicare HMO | Admitting: Pulmonary Disease

## 2023-01-03 ENCOUNTER — Encounter: Payer: Self-pay | Admitting: Pulmonary Disease

## 2023-01-03 VITALS — BP 153/71 | Ht 70.0 in | Wt 190.5 lb

## 2023-01-03 DIAGNOSIS — Z87891 Personal history of nicotine dependence: Secondary | ICD-10-CM

## 2023-01-03 DIAGNOSIS — R0609 Other forms of dyspnea: Secondary | ICD-10-CM | POA: Diagnosis not present

## 2023-01-03 DIAGNOSIS — I1A0 Resistant hypertension: Secondary | ICD-10-CM | POA: Diagnosis not present

## 2023-01-03 DIAGNOSIS — J432 Centrilobular emphysema: Secondary | ICD-10-CM

## 2023-01-03 MED ORDER — TRELEGY ELLIPTA 100-62.5-25 MCG/ACT IN AEPB
1.0000 | INHALATION_SPRAY | Freq: Every day | RESPIRATORY_TRACT | 5 refills | Status: DC
Start: 2023-01-03 — End: 2024-01-14

## 2023-01-03 MED ORDER — TRELEGY ELLIPTA 100-62.5-25 MCG/ACT IN AEPB: 1.0000 | INHALATION_SPRAY | Freq: Every day | RESPIRATORY_TRACT | 0 refills | Status: DC

## 2023-01-03 MED ORDER — TRELEGY ELLIPTA 100-62.5-25 MCG/ACT IN AEPB: 1.0000 | INHALATION_SPRAY | Freq: Every day | RESPIRATORY_TRACT | 5 refills | Status: DC

## 2023-01-03 NOTE — Progress Notes (Signed)
   Subjective:    Patient ID: Hector Torres, male    DOB: Nov 09, 1940, 82 y.o.   MRN: [id number]  HPI  82 yo for follow-up of COPD.   PMH -renal artery stenosis - neg angio CAD -PTCA to left circumflex 1995 10/2021 neurocardiogenic syncope  with PFT maneuver 'vasovagal' -left subclavian stenosis -He smoked more than 90 pack years until he quit in 2012  59-month follow-up visit. Trelegy has worked well for him.  He denies interim exacerbations.  He has been able to play 3 rounds of golf.  Pollen season has not been too hard on him. Reviewed cardiology follow-up visit. Trelegy is expensive and puts him in the donut hole  Chest x-ray 04/2021 was clear  Significant tests/ events reviewed   PFTs 10/2021 severe airway obs, ratio 39, FEV1 2.04/39% ,FVC 71%, TLC nml, DLCO 9.4/38%   CTA angiogram chest 04/2015 emphysema   CT abdomen 06/2021 minimal right lower lobe scarring   Review of Systems neg for any significant sore throat, dysphagia, itching, sneezing, nasal congestion or excess/ purulent secretions, fever, chills, sweats, unintended wt loss, pleuritic or exertional cp, hempoptysis, orthopnea pnd or change in chronic leg swelling. Also denies presyncope, palpitations, heartburn, abdominal pain, nausea, vomiting, diarrhea or change in bowel or urinary habits, dysuria,hematuria, rash, arthralgias, visual complaints, headache, numbness weakness or ataxia.     Objective:   Physical Exam  Gen. Pleasant, elderly, in no distress ENT - no lesions, no post nasal drip Neck: No JVD, no thyromegaly, no carotid bruits Lungs: no use of accessory muscles, no dullness to percussion, decreased without rales or rhonchi  Cardiovascular: Rhythm regular, heart sounds  normal, no murmurs or gallops, no peripheral edema Musculoskeletal: No deformities, no cyanosis or clubbing , no tremors       Assessment & Plan:

## 2023-01-03 NOTE — Assessment & Plan Note (Addendum)
With his vasovagal episodes while doing PFTs will not be repeating PFTs for him. Refills will be provided on Trelegy. Albuterol does not work well for him. We discussed signs and symptoms of COPD exacerbation and plan for the same He has turndown pulmonary rehab referral in the past

## 2023-01-03 NOTE — Assessment & Plan Note (Signed)
Blood pressure slight high today but was normal in the cardiologist office.  It fluctuates according to him and likely whitecoat hypertension

## 2023-01-03 NOTE — Patient Instructions (Signed)
X refills on trelegy

## 2023-01-21 ENCOUNTER — Other Ambulatory Visit: Payer: Self-pay | Admitting: Family Medicine

## 2023-01-21 DIAGNOSIS — E782 Mixed hyperlipidemia: Secondary | ICD-10-CM

## 2023-02-12 ENCOUNTER — Encounter: Payer: Self-pay | Admitting: Family Medicine

## 2023-02-12 ENCOUNTER — Ambulatory Visit (INDEPENDENT_AMBULATORY_CARE_PROVIDER_SITE_OTHER): Payer: Medicare HMO | Admitting: Family Medicine

## 2023-02-12 VITALS — BP 100/45 | HR 74 | Temp 96.9°F | Wt 190.8 lb

## 2023-02-12 DIAGNOSIS — Z0000 Encounter for general adult medical examination without abnormal findings: Secondary | ICD-10-CM

## 2023-02-12 DIAGNOSIS — L570 Actinic keratosis: Secondary | ICD-10-CM

## 2023-02-12 DIAGNOSIS — J432 Centrilobular emphysema: Secondary | ICD-10-CM | POA: Diagnosis not present

## 2023-02-12 DIAGNOSIS — E782 Mixed hyperlipidemia: Secondary | ICD-10-CM

## 2023-02-12 DIAGNOSIS — I10 Essential (primary) hypertension: Secondary | ICD-10-CM | POA: Diagnosis not present

## 2023-02-12 DIAGNOSIS — I739 Peripheral vascular disease, unspecified: Secondary | ICD-10-CM | POA: Diagnosis not present

## 2023-02-12 DIAGNOSIS — I25118 Atherosclerotic heart disease of native coronary artery with other forms of angina pectoris: Secondary | ICD-10-CM

## 2023-02-12 DIAGNOSIS — Z0001 Encounter for general adult medical examination with abnormal findings: Secondary | ICD-10-CM

## 2023-02-12 NOTE — Patient Instructions (Signed)
Cryoablation, Care After The following information offers guidance on how to care for yourself after your procedure. Your health care provider may also give you more specific instructions. If you have problems or questions, contact your health care provider. What can I expect after the procedure? After the procedure, it is common to have: Redness or blisters near the area treated. Mild pain and swelling. Follow these instructions at home: Treatment area care  If you have an incision, follow instructions from your health care provider about how to take care of it. Make sure you: Wash your hands with soap and water for at least 20 seconds before and after you change your bandage (dressing). If soap and water are not available, use hand sanitizer. Change your dressing as told by your health care provider. Leave stitches (sutures), skin glue, or adhesive strips in place. These skin closures may need to stay in place for 2 weeks or longer. If adhesive strip edges start to loosen and curl up, you may trim the loose edges. Do not remove adhesive strips completely unless your health care provider tells you to do that. Check your treatment area every day for signs of infection. Check for: More redness, swelling, or pain. Fluid or blood. Warmth. Pus or a bad smell. Keep the treated area clean and dry. Keep it covered with a dressing until it has healed. Clean the area with soap and water as told by your health care provider. If your dressing gets wet, change it right away. Activity  Follow instructions from your health care provider about what activities are safe for you. You may have to avoid lifting. Ask your health care provider how much you can safely lift. If you were given a sedative during the procedure, it can affect you for several hours. Do not drive or operate machinery until your health care provider says that it is safe. General instructions Take over-the-counter and prescription  medicines only as told by your health care provider. Do not use any products that contain nicotine or tobacco. These products include cigarettes, chewing tobacco, and vaping devices, such as e-cigarettes. These can delay incision healing. If you need help quitting, ask your health care provider. Do not take baths, swim, or use a hot tub until your health care provider approves. Ask your health care provider if you may take showers. You may only be allowed to take sponge baths. Keep all follow-up visits. Your health care provider may need to check that treatment worked and that there were no problems caused by the procedure. Contact a health care provider if: You have more pain. You have a fever. You have nausea or vomiting. You have any signs of infection. You do not have a bowel movement for 2 days. You cannot urinate, or you cannot control when you urinate or have a bowel movement (have incontinence). You develop impotence. Get help right away if: You have severe pain. You have trouble swallowing or breathing. You are very weak or dizzy. You have chest pain or shortness of breath. These symptoms may be an emergency. Get help right away. Call 911. Do not wait to see if the symptoms will go away. Do not drive yourself to the hospital. This information is not intended to replace advice given to you by your health care provider. Make sure you discuss any questions you have with your health care provider. Document Revised: 01/18/2022 Document Reviewed: 01/18/2022 Elsevier Patient Education  2024 Elsevier Inc.  

## 2023-02-12 NOTE — Progress Notes (Signed)
Hector Torres is a 82 y.o. male presents to office today for annual physical exam examination.    Concerns today include: 1. HTN associated with HLD and CAD Under the care of Dr Diona Browner.  Saw pulmonology recently and has persistent HTN.  In may of last year, Dr Diona Browner recommended going back on Avapro for BP control.  Blood pressures have been relatively well-controlled however.  He is seeing systolics anywhere from 99-133 with controlled diastolics.  No reports chest pain, shortness of breath or dizziness.  Needs refills on HCTZ.  2.  Scalp lesion Patient has a couple of scalp lesions that he would like me to look at.  They are crusty.  Does not report any enlargement or hyperpigmentation but thinks they probably need to be frozen off  Occupation: retired  Diet: typical [place] Last eye exam: UTD Last dental exam: UTD Last colonoscopy: UTD Refills needed today: HCTZ Immunizations needed: Immunization History  Administered Date(s) Administered   Fluad Quad(high Dose 65+) 05/31/2021, 07/30/2021, 05/19/2022   Influenza Split 06/11/2013   Influenza, High Dose Seasonal PF 06/14/2014, 06/03/2017, 05/20/2018   Influenza,inj,Quad PF,6+ Mos 05/19/2015, 05/20/2022   Influenza-Unspecified 05/31/2016, 05/27/2019, 03/30/2020   Janssen (J&J) SARS-COV-2 Vaccination 01/25/2020, 07/05/2020   Pneumococcal Conjugate-13 08/28/2018   Pneumococcal Polysaccharide-23 06/27/2008, 05/19/2015   Respiratory Syncytial Virus Vaccine,Recomb Aduvanted(Arexvy) 08/28/2022   Tdap 04/25/2006, 02/11/2018   Zoster, Live 08/10/2008     Past Medical History:  Diagnosis Date   Arthritis    BPH (benign prostatic hypertrophy)    Carotid artery disease (HCC)    Colon polyp    COPD (chronic obstructive pulmonary disease) (HCC)    Coronary atherosclerosis of native coronary artery    PTCA circumflex at [facility] 1995, nonobstructive residual disease at catheterization June and October 2016   Essential hypertension     GERD (gastroesophageal reflux disease)    History of nephrolithiasis    Mixed hyperlipidemia    Prostate nodule    PVD (peripheral vascular disease) (HCC)    TIA (transient ischemic attack)    Vitamin D deficiency    Social History   Socioeconomic History   Marital status: Widowed    Spouse name: Not on file   Number of children: Not on file   Years of education: Not on file   Highest education level: Not on file  Occupational History   Not on file  Tobacco Use   Smoking status: Former    Packs/day: 1.50    Years: 60.00    Additional pack years: 0.00    Total pack years: 90.00    Types: Cigarettes    Quit date: 02/27/2011    Years since quitting: 11.9   Smokeless tobacco: Never  Vaping Use   Vaping Use: Never used  Substance and Sexual Activity   Alcohol use: No   Drug use: No   Sexual activity: Not on file  Other Topics Concern   Not on file  Social History Narrative   Not on file   Social Determinants of Health   Financial Resource Strain: Low Risk  (12/27/2020)   Overall Financial Resource Strain (CARDIA)    Difficulty of Paying Living Expenses: Not hard at all  Food Insecurity: No Food Insecurity (12/27/2020)   Hunger Vital Sign    Worried About Running Out of Food in the Last Year: Never true    Ran Out of Food in the Last Year: Never true  Transportation Needs: No Transportation Needs (12/27/2020)   PRAPARE - Transportation  Lack of Transportation (Medical): No    Lack of Transportation (Non-Medical): No  Physical Activity: Inactive (12/27/2020)   Exercise Vital Sign    Days of Exercise per Week: 0 days    Minutes of Exercise per Session: 0 min  Stress: No Stress Concern Present (12/27/2020)   [employer] of Occupational Health - Occupational Stress Questionnaire    Feeling of Stress : Only a little  Social Connections: Not on file  Intimate Partner Violence: Not on file   Past Surgical History:  Procedure Laterality Date   ABDOMINAL  AORTOGRAM W/LOWER EXTREMITY N/A 08/27/2021   Procedure: ABDOMINAL AORTOGRAM W/LOWER EXTREMITY;  Surgeon: Runell Gess, MD;  Location: [facility] INVASIVE CV LAB;  Service: Cardiovascular;  Laterality: N/A;   ANGIOPLASTY Left 1995   Cir. Artery   APPENDECTOMY  1947   CARDIAC CATHETERIZATION N/A 02/03/2015   Procedure: Left Heart Cath and Cors/Grafts Angiography;  Surgeon: Tonny Bollman, MD;  Location: [facility] INVASIVE CV LAB;  Service: Cardiovascular;  Laterality: N/A;   CARDIAC CATHETERIZATION N/A 05/19/2015   Procedure: Left Heart Cath and Coronary Angiography;  Surgeon: Lennette Bihari, MD;  Location: [facility] INVASIVE CV LAB;  Service: Cardiovascular;  Laterality: N/A;   COLONOSCOPY N/A 03/19/2018   Procedure: COLONOSCOPY;  Surgeon: West Bali, MD;  Location: [facility] ENDO SUITE;  Service: Endoscopy;  Laterality: N/A;  12:00   EYE SURGERY     Bilateral cataract   POLYPECTOMY  03/19/2018   Procedure: POLYPECTOMY;  Surgeon: West Bali, MD;  Location: [facility] ENDO SUITE;  Service: Endoscopy;;   TONSILLECTOMY  1968   Traumatic injury  1962   Lost tip of index, middle, and ring finger/ right hand   Family History  Problem Relation Age of Onset   Gastric cancer Mother    Varicose Veins Mother    Cancer Mother        Stomach   Prostate cancer Father    Cancer Father        Prostate   Other Brother        carotid artery stenosis   Diabetes Brother    Leukemia Brother    Hyperlipidemia Brother    Hypertension Brother    Colon cancer Brother    Hyperlipidemia Brother    Peripheral vascular disease Brother    Cancer Brother        Colon and Prostate   Hypertension Sister    Hyperlipidemia Sister    Diabetes Daughter    Diabetes Son    Prostate cancer Brother     Current Outpatient Medications:    albuterol (VENTOLIN HFA) 108 (90 Base) MCG/ACT inhaler, Inhale 2 puffs into the lungs every 6 (six) hours as needed for wheezing or shortness of breath., Disp: 8 g, Rfl: 6   aspirin EC 81 MG tablet, Take 81  mg by mouth at bedtime. Swallow whole., Disp: , Rfl:    carvedilol (COREG) 6.25 MG tablet, TAKE 1 TABLET TWICE DAILY WITH MEALS, Disp: 180 tablet, Rfl: 0   cholecalciferol (VITAMIN D3) 25 MCG (1000 UNIT) tablet, Take 1,000 Units by mouth every other day., Disp: , Rfl:    diphenhydramine-acetaminophen (TYLENOL PM) 25-500 MG TABS tablet, Take 2 tablets by mouth at bedtime as needed (sleep/pain)., Disp: , Rfl:    Fluticasone-Umeclidin-Vilant (TRELEGY ELLIPTA) 100-62.5-25 MCG/ACT AEPB, Inhale 1 puff into the lungs daily., Disp: 180 each, Rfl: 5   hydrochlorothiazide (HYDRODIURIL) 25 MG tablet, TAKE 1 TABLET EVERY DAY, Disp: 90 tablet, Rfl: 1   isosorbide mononitrate (  IMDUR) 30 MG 24 hr tablet, TAKE 1 TABLET TWICE DAILY (NEED MD APPOINTMENT), Disp: 180 tablet, Rfl: 1   Multiple Vitamin (MULTIVITAMIN WITH MINERALS) TABS tablet, Take 1 tablet by mouth at bedtime., Disp: , Rfl:    Multiple Vitamins-Minerals (PRESERVISION AREDS PO), Take 1 tablet by mouth in the morning and at bedtime., Disp: , Rfl:    rosuvastatin (CRESTOR) 20 MG tablet, TAKE 1 TABLET EVERY DAY, Disp: 90 tablet, Rfl: 3   tamsulosin (FLOMAX) 0.4 MG CAPS capsule, Take 0.4 mg by mouth daily., Disp: , Rfl:   Current Facility-Administered Medications:    sodium chloride flush (NS) 0.9 % injection 3 mL, 3 mL, Intravenous, Q12H, Runell Gess, MD  Allergies  Allergen Reactions   Norvasc [Amlodipine Besylate] Swelling and Other (See Comments)    LE swelling     ROS: Review of Systems Pertinent items noted in HPI and remainder of comprehensive ROS otherwise negative.    Physical exam BP (!) 100/45   Pulse 74   Temp (!) 96.9 F (36.1 C) (Oral)   Wt 190 lb 12.8 oz (86.5 kg)   SpO2 97%   BMI 27.38 kg/m  General appearance: alert, cooperative, appears stated age, and no distress Head: Normocephalic, without obvious abnormality, atraumatic Eyes: negative findings: lids and lashes normal, conjunctivae and sclerae normal, corneas  clear, and pupils equal, round, reactive to light and accomodation Ears:  right TM obscured by cerumen.  Left TM with normal light reflex Nose: Nares normal. Septum midline. Mucosa normal. No drainage or sinus tenderness. Throat: lips, mucosa, and tongue normal; teeth and gums normal Neck: no adenopathy, no carotid bruit, no JVD, supple, symmetrical, trachea midline, and thyroid not enlarged, symmetric, no tenderness/mass/nodules Back: symmetric, no curvature. ROM normal. No CVA tenderness. Lungs:  Globally decreased breath sounds but no wheezes, rhonchi or rales.  Normal work of breathing on room air Chest wall: no tenderness Heart: regular rate and rhythm, S1, S2 normal, no murmur, click, rub or gallop Abdomen: soft, non-tender; bowel sounds normal; no masses,  no organomegaly Extremities: extremities normal, atraumatic, no cyanosis or edema Pulses: 2+ and symmetric Skin:  Several areas of hyperpigmentation, seborrheic keratoses.  He has a total of 8 actinic keratoses noted at the apex of the head.  4 along the left parietal extending from the anterior scalp to the posterior in a linear fashion, 4 on the right parietal side of the scalp, 1 anteriorly and then a cluster of 3 posteriorly. Lymph nodes: Cervical, supraclavicular, and axillary nodes normal. Neurologic: Grossly normal    02/12/2023   10:30 AM 08/13/2022   10:39 AM 01/28/2022   11:26 AM 07/30/2021    1:22 PM 04/30/2021    3:07 PM  Depression screen PHQ 2/9  Decreased Interest 0 0 0 0 0  Down, Depressed, Hopeless 0 0 0 0 0  PHQ - 2 Score 0 0 0 0 0  Altered sleeping 0   2 3  Tired, decreased energy 3   3 3   Change in appetite 0   0 0  Feeling bad or failure about yourself  0   0 0  Trouble concentrating 0   0 0  Moving slowly or fidgety/restless 0   0 0  Suicidal thoughts 0   0 0  PHQ-9 Score 3   5 6   Difficult doing work/chores    Somewhat difficult Not difficult at all    Cryotherapy Procedure:  Risks and benefits of  procedure were reviewed with the patient.  Written consent obtained and scanned into the chart.  Lesion of concern was identified and located on scalp x8 (4 on each side of top of head).  Liquid nitrogen was applied to area of concern and extending out 1 millimeters beyond the border of the lesion.  Treated area was allowed to come back to room temperature before treating it a second time.  Patient tolerated procedure well and there were no immediate complications.  Home care instructions were reviewed with the patient and a handout was provided.   Assessment/ Plan: Cherlyn Labella here for annual physical exam.   Annual physical exam  Essential hypertension  Mixed hyperlipidemia - Plan: CMP14+EGFR, Lipid Panel, TSH  Atherosclerosis of native coronary artery of native heart with stable angina pectoris (HCC) - Plan: CMP14+EGFR, Lipid Panel, TSH  Centrilobular emphysema (HCC) - Plan: CMP14+EGFR, CBC  PVD (peripheral vascular disease) (HCC)  Actinic keratoses  Blood pressure is controlled and borderline on the low side.  I certainly do not think he needs any additional medications at this time.  Check fasting labs.  Continue statin for treatment of cholesterol  Will CC his labs to cardiology.  Continue to follow their recommendations to reduce cardiac event risk  Check CBC.  Breathing is stable with Trelegy.  Samples provided today  Again blood pressure control, daily baby aspirin etc. to improve blood flow  Actinic keratoses treated with cryoablation x 8.  He tolerated the procedure without difficulty.  Home care instructions reviewed and reasons for reevaluation discussed.  He may follow-up in 6 months if needed, sooner if concerns arise  Counseled on healthy lifestyle choices, including diet (rich in fruits, vegetables and lean meats and low in salt and simple carbohydrates) and exercise (at least 30 minutes of moderate physical activity daily).     Latrese Carolan M. Nadine Counts,  DO

## 2023-02-13 LAB — CMP14+EGFR
ALT: 11 IU/L (ref 0–44)
AST: 20 IU/L (ref 0–40)
Albumin: 3.9 g/dL (ref 3.7–4.7)
Alkaline Phosphatase: 56 IU/L (ref 44–121)
BUN/Creatinine Ratio: 13 (ref 10–24)
BUN: 16 mg/dL (ref 8–27)
Bilirubin Total: 0.6 mg/dL (ref 0.0–1.2)
CO2: 24 mmol/L (ref 20–29)
Calcium: 9.1 mg/dL (ref 8.6–10.2)
Chloride: 100 mmol/L (ref 96–106)
Creatinine, Ser: 1.25 mg/dL (ref 0.76–1.27)
Globulin, Total: 2.2 g/dL (ref 1.5–4.5)
Glucose: 90 mg/dL (ref 70–99)
Potassium: 3.9 mmol/L (ref 3.5–5.2)
Sodium: 140 mmol/L (ref 134–144)
Total Protein: 6.1 g/dL (ref 6.0–8.5)
eGFR: 57 mL/min/{1.73_m2} — ABNORMAL LOW (ref >59)

## 2023-02-13 LAB — CBC
Hematocrit: 50.1 % (ref 37.5–51.0)
Hemoglobin: 16.6 g/dL (ref 13.0–17.7)
MCH: 33.8 pg — ABNORMAL HIGH (ref 26.6–33.0)
MCHC: 33.1 g/dL (ref 31.5–35.7)
MCV: 102 fL — ABNORMAL HIGH (ref 79–97)
Platelets: 185 10*3/uL (ref 150–450)
RBC: 4.91 x10E6/uL (ref 4.14–5.80)
RDW: 12.8 % (ref 11.6–15.4)
WBC: 8.6 10*3/uL (ref 3.4–10.8)

## 2023-02-13 LAB — TSH: TSH: 1.37 u[IU]/mL (ref 0.450–4.500)

## 2023-02-13 LAB — LIPID PANEL
Chol/HDL Ratio: 2.7 ratio (ref 0.0–5.0)
Cholesterol, Total: 112 mg/dL (ref 100–199)
HDL: 41 mg/dL (ref >39)
LDL Chol Calc (NIH): 54 mg/dL (ref 0–99)
Triglycerides: 84 mg/dL (ref 0–149)
VLDL Cholesterol Cal: 17 mg/dL (ref 5–40)

## 2023-03-16 ENCOUNTER — Other Ambulatory Visit: Payer: Self-pay | Admitting: Cardiology

## 2023-05-05 ENCOUNTER — Ambulatory Visit: Payer: Medicare HMO | Attending: Cardiovascular Disease

## 2023-05-05 DIAGNOSIS — I6523 Occlusion and stenosis of bilateral carotid arteries: Secondary | ICD-10-CM

## 2023-05-23 ENCOUNTER — Other Ambulatory Visit: Payer: Self-pay | Admitting: Family Medicine

## 2023-06-16 DIAGNOSIS — H353190 Nonexudative age-related macular degeneration, unspecified eye, stage unspecified: Secondary | ICD-10-CM | POA: Diagnosis not present

## 2023-06-23 ENCOUNTER — Ambulatory Visit: Payer: Medicare HMO | Attending: Cardiology | Admitting: Cardiology

## 2023-06-23 ENCOUNTER — Encounter: Payer: Self-pay | Admitting: Cardiology

## 2023-06-23 VITALS — BP 138/72 | HR 84 | Ht 70.0 in | Wt 200.6 lb

## 2023-06-23 DIAGNOSIS — I6523 Occlusion and stenosis of bilateral carotid arteries: Secondary | ICD-10-CM

## 2023-06-23 DIAGNOSIS — I10 Essential (primary) hypertension: Secondary | ICD-10-CM | POA: Diagnosis not present

## 2023-06-23 DIAGNOSIS — E782 Mixed hyperlipidemia: Secondary | ICD-10-CM

## 2023-06-23 DIAGNOSIS — I25119 Atherosclerotic heart disease of native coronary artery with unspecified angina pectoris: Secondary | ICD-10-CM

## 2023-06-23 NOTE — Patient Instructions (Addendum)
Medication Instructions:  Your physician recommends that you continue on your current medications as directed. Please refer to the Current Medication list given to you today.  Labwork: none  Testing/Procedures: Your physician has requested that you have a carotid duplex in September 2025. This test is an ultrasound of the carotid arteries in your neck. It looks at blood flow through these arteries that supply the brain with blood. Allow one hour for this exam. There are no restrictions or special instructions.  Follow-Up: Your physician recommends that you schedule a follow-up appointment in: 6 months  Any Other Special Instructions Will Be Listed Below (If Applicable).  If you need a refill on your cardiac medications before your next appointment, please call your pharmacy.

## 2023-06-23 NOTE — Progress Notes (Signed)
Cardiology Office Note  Date: 06/23/2023   ID: Enrigue, Hashimi 18-Apr-1941, MRN [id number]  History of Present Illness: Hector Torres is an 82 y.o. male last seen in May.  He is here for a routine visit.  He does not report any active angina on current medical therapy, stable NYHA class II dyspnea.  No palpitations.  States that he checks blood pressure at home.  He has known left subclavian stenosis, I asked him to make sure he checks blood pressure in his right arm.  I rechecked it today in the right arm at 138/72.  I reviewed his medications.  Current cardiac regimen includes aspirin, Coreg, HCTZ, Imdur, and Crestor.  He had lab work in June with LDL 54 at that time.  We discussed his carotid Dopplers from September.  Physical Exam: VS:  BP 138/72 (BP Location: Right Arm)   Pulse 84   Ht 5\' 10"  (1.778 m)   Wt 200 lb 9.6 oz (91 kg)   SpO2 95%   BMI 28.78 kg/m , BMI Body mass index is 28.78 kg/m.  Wt Readings from Last 3 Encounters:  06/23/23 200 lb 9.6 oz (91 kg)  02/12/23 190 lb 12.8 oz (86.5 kg)  01/03/23 190 lb 8 oz (86.4 kg)    General: Patient appears comfortable at rest. HEENT: Conjunctiva and lids normal. Neck: Supple, no elevated JVP or carotid bruits. Lungs: Clear to auscultation, nonlabored breathing at rest. Cardiac: Regular rate and rhythm, no S3, 1/6 systolic murmur. Extremities: No pitting edema.  ECG:  An ECG dated 01/02/2023 was personally reviewed today and demonstrated:  Sinus rhythm with PVCs.  Labwork: 02/12/2023: ALT 11; AST 20; BUN 16; Creatinine, Ser 1.25; Hemoglobin 16.6; Platelets 185; Potassium 3.9; Sodium 140; TSH 1.370     Component Value Date/Time   CHOL 112 02/12/2023 1128   CHOL 102 12/25/2012 1251   TRIG 84 02/12/2023 1128   TRIG 82 12/25/2012 1251   HDL 41 02/12/2023 1128   HDL 36 (L) 12/25/2012 1251   CHOLHDL 2.7 02/12/2023 1128   LDLCALC 54 02/12/2023 1128   LDLCALC 50 12/25/2012 1251   Other Studies Reviewed  Today:  Carotid Dopplers 05/05/2023: Summary:  Right Carotid: Velocities in the right ICA are consistent with a 1-39%  stenosis.                Non-hemodynamically significant plaque <50% noted in the  CCA. The                 ECA appears <50% stenosed.   Left Carotid: Velocities in the left ICA are consistent with a 60-79%  stenosis.               The ECA appears <50% stenosed.   Vertebrals:  Right vertebral artery demonstrates antegrade flow. Left  vertebral              artery demonstrates bidirectional flow.  Subclavians: Left subclavian artery was stenotic. Normal flow hemodynamics  were              seen in bilateral subclavian arteries.   Assessment and Plan:  1.  CAD status post angioplasty of the circumflex at [facility] back in 1995 with nonobstructive disease documented at follow-up cardiac catheterization in October 2016.  LVEF 55 to 60% by echocardiogram in November 2022.  He does not describe any angina.  Plan to continue observation on medical therapy which now includes aspirin, Imdur, and Crestor.   2.  Primary hypertension.  He has left subclavian artery stenosis, following blood pressure in right arm.  Recheck today at 138/72.  For now continue with present regimen including Coreg and HCTZ.   3.  Carotid artery disease.  Carotid Dopplers in September of this year revealed 1 to 39% RICA stenosis and 60 to 79% LICA stenosis.  Continue aspirin and Crestor.  Recheck carotid Dopplers next September.   4.  Mixed hyperlipidemia.  LDL 54 in June.  Continue Crestor.  Disposition:  Follow up  6 months.  Signed, Jonelle Sidle, M.D., F.A.C.C. [facility]

## 2023-07-10 ENCOUNTER — Ambulatory Visit (HOSPITAL_BASED_OUTPATIENT_CLINIC_OR_DEPARTMENT_OTHER): Payer: Medicare HMO | Admitting: Pulmonary Disease

## 2023-08-08 ENCOUNTER — Other Ambulatory Visit: Payer: Self-pay | Admitting: Cardiology

## 2023-08-18 ENCOUNTER — Encounter: Payer: Self-pay | Admitting: Family Medicine

## 2023-08-18 ENCOUNTER — Ambulatory Visit (INDEPENDENT_AMBULATORY_CARE_PROVIDER_SITE_OTHER): Payer: Medicare HMO | Admitting: Family Medicine

## 2023-08-18 VITALS — BP 140/75 | HR 82 | Temp 98.5°F | Ht 70.0 in | Wt 197.0 lb

## 2023-08-18 DIAGNOSIS — L570 Actinic keratosis: Secondary | ICD-10-CM

## 2023-08-18 DIAGNOSIS — I10 Essential (primary) hypertension: Secondary | ICD-10-CM | POA: Diagnosis not present

## 2023-08-18 DIAGNOSIS — N400 Enlarged prostate without lower urinary tract symptoms: Secondary | ICD-10-CM | POA: Diagnosis not present

## 2023-08-18 DIAGNOSIS — M25552 Pain in left hip: Secondary | ICD-10-CM | POA: Diagnosis not present

## 2023-08-18 MED ORDER — FLUOROURACIL 5 % EX CREA: TOPICAL_CREAM | CUTANEOUS | 1 refills | Status: DC

## 2023-08-18 NOTE — Progress Notes (Addendum)
Subjective: CC: Follow-up hypertension, actinic keratosis PCP: Hector Ip, DO ZOX:[id number] Torres Hector is a 82 y.o. male presenting to clinic today for:  1.  Hypertension Patient is compliant with meds.  No chest pain, shortness of breath or dizziness  2.  Actinic keratoses Treated with cryoablation on scalp.  Here for reevaluation and consideration for 5-FU.  He notes that he has found a couple of more lesions along the ears and forearms that he would like to be looked out as well.  Reports no spontaneous bleeding of these lesions  3.  Left hip pain Reports that he is been having some increasing left-sided hip pain.  This has been a chronic issue for him but seems to be a bit more prominent now.  Has difficulty getting up from a seated position.  Points to the left buttock as the area of pain.  Was previously seen in [place] by a specialist for his knee.  He notes pain is refractory to Tylenol.  Cannot take NSAIDs secondary to cardiac history   ROS: Per HPI  Allergies  Allergen Reactions   Norvasc [Amlodipine Besylate] Swelling and Other (See Comments)    LE swelling   Past Medical History:  Diagnosis Date   Arthritis    BPH (benign prostatic hypertrophy)    Carotid artery disease (HCC)    Colon polyp    COPD (chronic obstructive pulmonary disease) (HCC)    Coronary atherosclerosis of native coronary artery    PTCA circumflex at [facility], nonobstructive residual disease at catheterization June and October 2016   Essential hypertension    GERD (gastroesophageal reflux disease)    History of nephrolithiasis    Mixed hyperlipidemia    Prostate nodule    PVD (peripheral vascular disease) (HCC)    TIA (transient ischemic attack)    Vitamin D deficiency     Current Outpatient Medications:    albuterol (VENTOLIN HFA) 108 (90 Base) MCG/ACT inhaler, Inhale 2 puffs into the lungs every 6 (six) hours as needed for wheezing or shortness of breath., Disp: 8 g, Rfl: 6   aspirin EC  81 MG tablet, Take 81 mg by mouth at bedtime. Swallow whole., Disp: , Rfl:    carvedilol (COREG) 6.25 MG tablet, TAKE 1 TABLET TWICE DAILY WITH MEALS, Disp: 180 tablet, Rfl: 3   cholecalciferol (VITAMIN D3) 25 MCG (1000 UNIT) tablet, Take 1,000 Units by mouth every other day., Disp: , Rfl:    diphenhydramine-acetaminophen (TYLENOL PM) 25-500 MG TABS tablet, Take 2 tablets by mouth at bedtime as needed (sleep/pain)., Disp: , Rfl:    Fluticasone-Umeclidin-Vilant (TRELEGY ELLIPTA) 100-62.5-25 MCG/ACT AEPB, Inhale 1 puff into the lungs daily., Disp: 180 each, Rfl: 5   hydrochlorothiazide (HYDRODIURIL) 25 MG tablet, TAKE 1 TABLET EVERY DAY, Disp: 90 tablet, Rfl: 3   isosorbide mononitrate (IMDUR) 30 MG 24 hr tablet, TAKE 1 TABLET TWICE DAILY (NEED MD APPOINTMENT), Disp: 180 tablet, Rfl: 1   Multiple Vitamin (MULTIVITAMIN WITH MINERALS) TABS tablet, Take 1 tablet by mouth at bedtime., Disp: , Rfl:    Multiple Vitamins-Minerals (PRESERVISION AREDS PO), Take 1 tablet by mouth in the morning and at bedtime., Disp: , Rfl:    rosuvastatin (CRESTOR) 20 MG tablet, TAKE 1 TABLET EVERY DAY, Disp: 90 tablet, Rfl: 3   tamsulosin (FLOMAX) 0.4 MG CAPS capsule, Take 0.4 mg by mouth daily., Disp: , Rfl:   Current Facility-Administered Medications:    sodium chloride flush (NS) 0.9 % injection 3 mL, 3 mL, Intravenous, Q12H,  Hector Gess, MD Social History   Socioeconomic History   Marital status: Widowed    Spouse name: Not on file   Number of children: Not on file   Years of education: Not on file   Highest education level: Associate degree: occupational, Scientist, product/process development, or vocational program  Occupational History   Not on file  Tobacco Use   Smoking status: Former    Current packs/day: 0.00    Average packs/day: 1.5 packs/day for 60.0 years (90.0 ttl pk-yrs)    Types: Cigarettes    Start date: 02/27/1951    Quit date: 02/27/2011    Years since quitting: 12.4   Smokeless tobacco: Never  Vaping Use    Vaping status: Never Used  Substance and Sexual Activity   Alcohol use: No   Drug use: No   Sexual activity: Not on file  Other Topics Concern   Not on file  Social History Narrative   Not on file   Social Drivers of Health   Financial Resource Strain: Low Risk  (08/14/2023)   Overall Financial Resource Strain (CARDIA)    Difficulty of Paying Living Expenses: Not very hard  Food Insecurity: No Food Insecurity (08/14/2023)   Hunger Vital Sign    Worried About Running Out of Food in the Last Year: Never true    Ran Out of Food in the Last Year: Never true  Transportation Needs: No Transportation Needs (08/14/2023)   PRAPARE - Administrator, Civil Service (Medical): No    Lack of Transportation (Non-Medical): No  Physical Activity: Unknown (08/14/2023)   Exercise Vital Sign    Days of Exercise per Week: 0 days    Minutes of Exercise per Session: Not on file  Stress: No Stress Concern Present (08/14/2023)   [employer] of Occupational Health - Occupational Stress Questionnaire    Feeling of Stress : Not at all  Social Connections: Moderately Integrated (08/14/2023)   Social Connection and Isolation Panel [NHANES]    Frequency of Communication with Friends and Family: More than three times a week    Frequency of Social Gatherings with Friends and Family: Once a week    Attends Religious Services: More than 4 times per year    Active Member of [employer] or Organizations: Yes    Attends Banker Meetings: More than 4 times per year    Marital Status: Widowed  Catering manager Violence: Not on file   Family History  Problem Relation Age of Onset   Gastric cancer Mother    Varicose Veins Mother    Cancer Mother        Stomach   Prostate cancer Father    Cancer Father        Prostate   Other Brother        carotid artery stenosis   Diabetes Brother    Leukemia Brother    Hyperlipidemia Brother    Hypertension Brother    Colon cancer Brother     Hyperlipidemia Brother    Peripheral vascular disease Brother    Cancer Brother        Colon and Prostate   Hypertension Sister    Hyperlipidemia Sister    Diabetes Daughter    Diabetes Son    Prostate cancer Brother     Objective: Office vital signs reviewed. BP (!) 140/75   Pulse 82   Temp 98.5 F (36.9 C)   Ht 5\' 10"  (1.778 Hector)   Wt 197 lb (89.4 kg)  SpO2 96%   BMI 28.27 kg/Hector   Physical Examination:  General: Awake, alert, well nourished, No acute distress HEENT sclera white, MMM Cardio: regular rate and rhythm, S1S2 heard, no murmurs appreciated Pulm: clear to auscultation bilaterally, no wheezes, rhonchi or rales; normal work of breathing on room air MSK: Independent but antalgic gait.  Requires upper extremity pushoff efforts to get up from a seated position. Skin: Multiple areas of hyperpigmentation, solar lentigo and actinic keratoses.  Specifically he had a lesion along the left dorsal forearm, right extensor surface of the elbow, left anterior lateral knee and 2 along the helix of the left ear.  He had several lesions across his scalp  Cryotherapy Procedure:  Risks and benefits of procedure were reviewed with the patient.  Written consent obtained and scanned into the chart.  Lesion of concern was identified and located on: - left dorsal forearm x1 - right extensor elbow x1 - left helix of ear x2 - left anteriolateral knee x1  Liquid nitrogen was applied to area of concern and extending out 1 millimeters beyond the border of the lesion.  Treated area was allowed to come back to room temperature before treating it a second time.  Patient tolerated procedure well and there were no immediate complications.  Home care instructions were reviewed with the patient and a handout was provided.  Assessment/ Plan: 82 y.o. male   Actinic keratoses - Plan: fluorouracil (EFUDEX) 5 % cream  Essential hypertension  Left hip pain - Plan: Ambulatory referral to Orthopedic  Surgery  Benign prostatic hyperplasia, unspecified whether lower urinary tract symptoms present - Plan: PSA  Lesions of concern on forearms, ear and knee treated with cryoablation.  He will apply 5-FU to the scalp twice daily for 2 to 4 weeks.  Will plan to reassess in the next few months for need to refer to dermatology  Blood pressure controlled for age.  No changes  Referral to orthopedics for left hip.  PSA collected today and we will CC to urologist who he has an appointment with later on this month   Hector Torres Hector Chiyo Fay, DO [facility] [phone]

## 2023-08-19 LAB — PSA: Prostate Specific Ag, Serum: 1.2 ng/mL (ref 0.0–4.0)

## 2023-08-21 ENCOUNTER — Ambulatory Visit: Payer: Medicare HMO | Admitting: Orthopaedic Surgery

## 2023-08-21 ENCOUNTER — Encounter: Payer: Self-pay | Admitting: Orthopaedic Surgery

## 2023-08-21 ENCOUNTER — Other Ambulatory Visit (INDEPENDENT_AMBULATORY_CARE_PROVIDER_SITE_OTHER): Payer: Self-pay

## 2023-08-21 ENCOUNTER — Other Ambulatory Visit: Payer: Self-pay

## 2023-08-21 VITALS — Ht 70.0 in | Wt 197.0 lb

## 2023-08-21 DIAGNOSIS — M25552 Pain in left hip: Secondary | ICD-10-CM

## 2023-08-21 DIAGNOSIS — M25562 Pain in left knee: Secondary | ICD-10-CM

## 2023-08-21 DIAGNOSIS — G8929 Other chronic pain: Secondary | ICD-10-CM

## 2023-08-21 DIAGNOSIS — M1612 Unilateral primary osteoarthritis, left hip: Secondary | ICD-10-CM

## 2023-08-25 DIAGNOSIS — M1612 Unilateral primary osteoarthritis, left hip: Secondary | ICD-10-CM | POA: Insufficient documentation

## 2023-08-25 NOTE — Progress Notes (Signed)
 Office Visit Note   Patient: Hector Torres           Date of Birth: 12/25/1940           MRN: [id number] Visit Date: 08/21/2023              Requested by: Jolinda Norene CHRISTELLA, DO [address] [place],  [place] [address] PCP: Jolinda Norene CHRISTELLA, DO   Assessment & Plan: Visit Diagnoses:  1. Pain in left hip   2. Chronic pain of left knee   3. Unilateral primary osteoarthritis, left hip     Plan: Will need to get cardiac clearance from Dr. Debera with his history of angioplasty 1995.  Will set him up to see Dr. Medford Poli to discuss left total hip arthroplasty in [place].  Follow-Up Instructions: No follow-ups on file.   Orders:  Orders Placed This Encounter  Procedures   XR HIP UNILAT W OR W/O PELVIS 2-3 VIEWS LEFT   XR KNEE 3 VIEW LEFT   Ambulatory referral to Orthopedic Surgery   No orders of the defined types were placed in this encounter.     Procedures: No procedures performed   Clinical Data: No additional findings.   Subjective: Chief Complaint  Patient presents with   Left Knee - Pain   Left Hip - Pain    HPI 83 year old male with severe left groin pain and left hip limp.  He was referred for evaluation of left total hip arthroplasty.  He said history of coronary artery disease angioplasty circumflex at [facility] with follow-up catheterization 2016.  Ejection fraction 55 to 60% by echo 2022.  Has hypertension controlled.  Some left subclavian artery stenosis and BP should be checked in the right arm.  Carotid disease with some narrowing all followed by Dr. Jayson Debera at [facility] in [place].  Patient has knee arthritis had cortisone injection as well as Hyalgan injections in his knee in past years by Dr. Anderson.  Last injection 2009.  Currently patient is having severe groin pain difficulty sleeping at night ambulating with a limp.  Does have some knee arthritis but left hip is much more painful and he is requesting scheduling for a  total hip arthroplasty.  Review of Systems history of non STEMI.  History of kidney stones.  COPD other systems noncontributory to HPI.   Objective: Vital Signs: Ht 5' 10 (1.778 m)   Wt 197 lb (89.4 kg)   BMI 28.27 kg/m   Physical Exam Constitutional:      Appearance: He is well-developed.  HENT:     Head: Normocephalic and atraumatic.     Right Ear: External ear normal.     Left Ear: External ear normal.  Eyes:     Pupils: Pupils are equal, round, and reactive to light.  Neck:     Thyroid : No thyromegaly.     Trachea: No tracheal deviation.  Cardiovascular:     Rate and Rhythm: Normal rate.  Pulmonary:     Effort: Pulmonary effort is normal.     Breath sounds: No wheezing.  Abdominal:     General: Bowel sounds are normal.     Palpations: Abdomen is soft.  Musculoskeletal:     Cervical back: Neck supple.  Skin:    General: Skin is warm and dry.     Capillary Refill: Capillary refill takes less than 2 seconds.  Neurological:     Mental Status: He is alert and oriented to person, place,  and time.  Psychiatric:        Behavior: Behavior normal.        Thought Content: Thought content normal.        Judgment: Judgment normal.     Ortho Exam patient has severe pain with left hip internal rotation.  Amatory with the left hip limp.  Mild swelling of the knee some medial and lateral joint line tenderness and crepitus with right and left knee range of motion.  Specialty Comments:  No specialty comments available.  Imaging: No results found.   PMFS History: Patient Active Problem List   Diagnosis Date Noted   Unilateral primary osteoarthritis, left hip 08/25/2023   Renal artery stenosis (HCC) 07/31/2021   History of colonic polyps    Unilateral primary osteoarthritis, left knee 02/04/2018   COPD (chronic obstructive pulmonary disease) (HCC) 08/01/2017   History of non-ST elevation myocardial infarction (NSTEMI)    History of renal stone    Benign prostatic  hyperplasia    Prostate nodule    PVD (peripheral vascular disease) (HCC)    Resistant hypertension 12/25/2012   Carotid stenosis, bilateral 04/03/2012   Coronary atherosclerosis of native coronary artery 04/15/2011   Mixed hyperlipidemia 04/15/2011   Past Medical History:  Diagnosis Date   Arthritis    BPH (benign prostatic hypertrophy)    Carotid artery disease (HCC)    Colon polyp    COPD (chronic obstructive pulmonary disease) (HCC)    Coronary atherosclerosis of native coronary artery    PTCA circumflex at [facility], nonobstructive residual disease at catheterization June and October 2016   Essential hypertension    GERD (gastroesophageal reflux disease)    History of nephrolithiasis    Mixed hyperlipidemia    Prostate nodule    PVD (peripheral vascular disease) (HCC)    TIA (transient ischemic attack)    Vitamin D  deficiency     Family History  Problem Relation Age of Onset   Gastric cancer Mother    Varicose Veins Mother    Cancer Mother        Stomach   Prostate cancer Father    Cancer Father        Prostate   Other Brother        carotid artery stenosis   Diabetes Brother    Leukemia Brother    Hyperlipidemia Brother    Hypertension Brother    Colon cancer Brother    Hyperlipidemia Brother    Peripheral vascular disease Brother    Cancer Brother        Colon and Prostate   Hypertension Sister    Hyperlipidemia Sister    Diabetes Daughter    Diabetes Son    Prostate cancer Brother     Past Surgical History:  Procedure Laterality Date   ABDOMINAL AORTOGRAM W/LOWER EXTREMITY N/A 08/27/2021   Procedure: ABDOMINAL AORTOGRAM W/LOWER EXTREMITY;  Surgeon: Court Dorn PARAS, MD;  Location: [facility] INVASIVE CV LAB;  Service: Cardiovascular;  Laterality: N/A;   ANGIOPLASTY Left 1995   Cir. Artery   APPENDECTOMY  1947   CARDIAC CATHETERIZATION N/A 02/03/2015   Procedure: Left Heart Cath and Cors/Grafts Angiography;  Surgeon: Ozell Fell, MD;  Location: [facility] INVASIVE  CV LAB;  Service: Cardiovascular;  Laterality: N/A;   CARDIAC CATHETERIZATION N/A 05/19/2015   Procedure: Left Heart Cath and Coronary Angiography;  Surgeon: Debby DELENA Sor, MD;  Location: [facility] INVASIVE CV LAB;  Service: Cardiovascular;  Laterality: N/A;   COLONOSCOPY N/A 03/19/2018   Procedure: COLONOSCOPY;  Surgeon: Harvey Margo CROME, MD;  Location: [facility] ENDO SUITE;  Service: Endoscopy;  Laterality: N/A;  12:00   EYE SURGERY     Bilateral cataract   POLYPECTOMY  03/19/2018   Procedure: POLYPECTOMY;  Surgeon: Harvey Margo CROME, MD;  Location: [facility] ENDO SUITE;  Service: Endoscopy;;   TONSILLECTOMY  1968   Traumatic injury  1962   Lost tip of index, middle, and ring finger/ right hand   Social History   Occupational History   Not on file  Tobacco Use   Smoking status: Former    Current packs/day: 0.00    Average packs/day: 1.5 packs/day for 60.0 years (90.0 ttl pk-yrs)    Types: Cigarettes    Start date: 02/27/1951    Quit date: 02/27/2011    Years since quitting: 12.4   Smokeless tobacco: Never  Vaping Use   Vaping status: Never Used  Substance and Sexual Activity   Alcohol use: No   Drug use: No   Sexual activity: Not on file

## 2023-09-11 ENCOUNTER — Encounter: Payer: Medicare HMO | Admitting: Orthopaedic Surgery

## 2023-09-18 ENCOUNTER — Encounter (HOSPITAL_BASED_OUTPATIENT_CLINIC_OR_DEPARTMENT_OTHER): Payer: Self-pay | Admitting: Pulmonary Disease

## 2023-09-18 ENCOUNTER — Ambulatory Visit (HOSPITAL_BASED_OUTPATIENT_CLINIC_OR_DEPARTMENT_OTHER): Payer: Medicare HMO | Admitting: Pulmonary Disease

## 2023-09-18 VITALS — BP 138/76 | HR 78 | Ht 70.0 in | Wt 201.2 lb

## 2023-09-18 DIAGNOSIS — M169 Osteoarthritis of hip, unspecified: Secondary | ICD-10-CM | POA: Diagnosis not present

## 2023-09-18 DIAGNOSIS — J439 Emphysema, unspecified: Secondary | ICD-10-CM

## 2023-09-18 DIAGNOSIS — J4489 Other specified chronic obstructive pulmonary disease: Secondary | ICD-10-CM

## 2023-09-18 NOTE — Patient Instructions (Signed)
X sample of Trelegy  You will be cleared for hip surgery with due risk

## 2023-09-18 NOTE — Progress Notes (Signed)
Subjective:    Patient ID: Hector Torres, male    DOB: 12-17-1940, 83 y.o.   MRN: [id number]  HPI  83 yo for follow-up of COPD. 10/2021 neurocardiogenic syncope  with PFT maneuver 'vasovagal'   PMH -renal artery stenosis - neg angio CAD -PTCA to left circumflex 1995 -left subclavian stenosis -He smoked more than 90 pack years until he quit in 2012   Discussed the use of AI scribe software for clinical note transcription with the patient, who gave verbal consent to proceed.  History of Present Illness   The patient, with a history of COPD, presents for a follow-up visit. He reports no changes in his breathing and continues to manage his COPD with Trelegy. He has not had any more episodes of passing out since his last visit. The patient mentions a significant increase in his copay for Trelegy, but plans to continue the medication as it has been effective in managing his COPD symptoms.  In addition to his COPD, the patient is dealing with hip pain. He saw an orthopedic doctor who recommended a total hip replacement. The patient is currently contemplating this option, but is concerned about the recovery process as he lacks a support system for recuperation. He also mentions a carbuncle on the bottom of his foot, which is causing difficulty with walking.  The patient is up to date on his vaccines, including COVID, RSV, and flu. He has not been using albuterol much as he did not find it helpful.       Significant tests/ events reviewed   PFTs 10/2021 severe airway obs, ratio 39, FEV1 2.04/39% ,FVC 71%, TLC nml, DLCO 9.4/38%   CTA angiogram chest 04/2015 emphysema   CT abdomen 06/2021 minimal right lower lobe scarring  Review of Systems neg for any significant sore throat, dysphagia, itching, sneezing, nasal congestion or excess/ purulent secretions, fever, chills, sweats, unintended wt loss, pleuritic or exertional cp, hempoptysis, orthopnea pnd or change in chronic leg swelling. Also  denies presyncope, palpitations, heartburn, abdominal pain, nausea, vomiting, diarrhea or change in bowel or urinary habits, dysuria,hematuria, rash, arthralgias, visual complaints, headache, numbness weakness or ataxia.     Objective:   Physical Exam  Gen. Pleasant, well-nourished, in no distress ENT - no thrush, no pallor/icterus,no post nasal drip Neck: No JVD, no thyromegaly, no carotid bruits Lungs: no use of accessory muscles, no dullness to percussion, clear without rales or rhonchi  Cardiovascular: Rhythm regular, heart sounds  normal, no murmurs or gallops, no peripheral edema Musculoskeletal: No deformities, no cyanosis or clubbing        Assessment & Plan:    Assessment and Plan    Chronic Obstructive Pulmonary Disease (COPD) Well-managed COPD with no recent exacerbations. Continues on Trelegy despite increased copay. Plans to apply for manufacturer's assistance program. Discussed alternatives Anoro and Breo, previously tried but not covered. Prefers Trelegy if affordable. - Continue Trelegy - Assist with manufacturer's assistance program application if needed    Hip Osteoarthritis Severe hip osteoarthritis requiring total hip replacement. Patient concerned about recovery support. Discussed spinal anesthesia to minimize risks and expedite recovery. Suggested rehab facility post-surgery for support. - Recommend spinal anesthesia for hip replacement - Discuss potential for rehab facility post-surgery -he will be cleared with due risk - at moderate risk for post op pulmonary complications  General Health Maintenance Up to date on COVID-19, RSV, and influenza vaccinations. - No additional vaccinations needed  Follow-up - Provide pre-operative clearance for hip replacement surgery if requested -  Follow-up as needed for COPD management and Trelegy issues.

## 2023-11-10 DIAGNOSIS — N200 Calculus of kidney: Secondary | ICD-10-CM | POA: Diagnosis not present

## 2023-11-10 DIAGNOSIS — R3912 Poor urinary stream: Secondary | ICD-10-CM | POA: Diagnosis not present

## 2023-11-10 DIAGNOSIS — N401 Enlarged prostate with lower urinary tract symptoms: Secondary | ICD-10-CM | POA: Diagnosis not present

## 2023-12-24 ENCOUNTER — Other Ambulatory Visit: Payer: Self-pay | Admitting: Family Medicine

## 2023-12-24 DIAGNOSIS — E782 Mixed hyperlipidemia: Secondary | ICD-10-CM

## 2024-01-13 ENCOUNTER — Other Ambulatory Visit (HOSPITAL_BASED_OUTPATIENT_CLINIC_OR_DEPARTMENT_OTHER): Payer: Self-pay | Admitting: Pulmonary Disease

## 2024-01-13 DIAGNOSIS — R0609 Other forms of dyspnea: Secondary | ICD-10-CM

## 2024-01-13 DIAGNOSIS — Z87891 Personal history of nicotine dependence: Secondary | ICD-10-CM

## 2024-01-13 NOTE — Telephone Encounter (Signed)
 Copied from CRM [id number]. Topic: Clinical - Medication Refill >> Jan 13, 2024  4:06 PM Corean Deutscher wrote: Medication: Fluticasone-Umeclidin-Vilant (TRELEGY ELLIPTA ) 100-62.5-25 MCG/ACT AEPB  Has the patient contacted their pharmacy? No (Agent: If no, request that the patient contact the pharmacy for the refill. If patient does not wish to contact the pharmacy document the reason why and proceed with request.) (Agent: If yes, when and what did the pharmacy advise?)  This is the patient's preferred pharmacy:   [facility] - [place], [place] - [address] [place] [address] Phone: [phone] Fax: [phone]  Is this the correct pharmacy for this prescription? Yes If no, delete pharmacy and type the correct one.   Has the prescription been filled recently? No  Is the patient out of the medication? No  Has the patient been seen for an appointment in the last year OR does the patient have an upcoming appointment? Yes  Can we respond through MyChart? Yes  Agent: Please be advised that Rx refills may take up to 3 business days. We ask that you follow-up with your pharmacy.

## 2024-01-14 ENCOUNTER — Other Ambulatory Visit (HOSPITAL_BASED_OUTPATIENT_CLINIC_OR_DEPARTMENT_OTHER): Payer: Self-pay

## 2024-01-14 DIAGNOSIS — R0609 Other forms of dyspnea: Secondary | ICD-10-CM

## 2024-01-14 DIAGNOSIS — Z87891 Personal history of nicotine dependence: Secondary | ICD-10-CM

## 2024-01-14 MED ORDER — TRELEGY ELLIPTA 100-62.5-25 MCG/ACT IN AEPB: 1.0000 | INHALATION_SPRAY | Freq: Every day | RESPIRATORY_TRACT | 5 refills | Status: DC

## 2024-03-10 ENCOUNTER — Other Ambulatory Visit: Payer: Self-pay | Admitting: Cardiology

## 2024-03-23 ENCOUNTER — Encounter: Payer: Self-pay | Admitting: Family Medicine

## 2024-03-23 ENCOUNTER — Ambulatory Visit: Payer: Medicare HMO | Admitting: Family Medicine

## 2024-03-23 VITALS — BP 107/59 | HR 74 | Temp 97.8°F | Ht 70.0 in | Wt 197.5 lb

## 2024-03-23 DIAGNOSIS — I739 Peripheral vascular disease, unspecified: Secondary | ICD-10-CM | POA: Diagnosis not present

## 2024-03-23 DIAGNOSIS — I10 Essential (primary) hypertension: Secondary | ICD-10-CM

## 2024-03-23 DIAGNOSIS — Z0001 Encounter for general adult medical examination with abnormal findings: Secondary | ICD-10-CM | POA: Diagnosis not present

## 2024-03-23 DIAGNOSIS — J432 Centrilobular emphysema: Secondary | ICD-10-CM | POA: Diagnosis not present

## 2024-03-23 DIAGNOSIS — Z0000 Encounter for general adult medical examination without abnormal findings: Secondary | ICD-10-CM

## 2024-03-23 DIAGNOSIS — I701 Atherosclerosis of renal artery: Secondary | ICD-10-CM | POA: Diagnosis not present

## 2024-03-23 DIAGNOSIS — I25118 Atherosclerotic heart disease of native coronary artery with other forms of angina pectoris: Secondary | ICD-10-CM

## 2024-03-23 DIAGNOSIS — N402 Nodular prostate without lower urinary tract symptoms: Secondary | ICD-10-CM

## 2024-03-23 DIAGNOSIS — D692 Other nonthrombocytopenic purpura: Secondary | ICD-10-CM

## 2024-03-23 DIAGNOSIS — E782 Mixed hyperlipidemia: Secondary | ICD-10-CM | POA: Diagnosis not present

## 2024-03-23 DIAGNOSIS — L570 Actinic keratosis: Secondary | ICD-10-CM

## 2024-03-23 LAB — LIPID PANEL

## 2024-03-23 MED ORDER — ROSUVASTATIN CALCIUM 20 MG PO TABS: 20.0000 mg | ORAL_TABLET | Freq: Every day | ORAL | 1 refills | Status: DC

## 2024-03-23 MED ORDER — CARVEDILOL 6.25 MG PO TABS: 6.2500 mg | ORAL_TABLET | Freq: Two times a day (BID) | ORAL | 3 refills | Status: AC

## 2024-03-23 MED ORDER — TRELEGY ELLIPTA 100-62.5-25 MCG/ACT IN AEPB: 1.0000 | INHALATION_SPRAY | Freq: Every day | RESPIRATORY_TRACT | 4 refills | Status: AC

## 2024-03-23 NOTE — Progress Notes (Signed)
 Hector Torres is a 83 y.o. male presents to office today for annual physical exam examination.    Concerns today include: 1.  Needs a written Rx for his emphysema meds.  He is going to try and pursue some patient assistance for this.  Breathing is been stable.  Will be seeing his eye doctor soon.  Wears glasses.  2.  Skin lesions He reports that most of the skin lesions that we treated with cryoablation previously have resolved.  He completed the Efudex  as directed but still has a couple of lesions along his scalp that he would like me to take a look at potentially retreat today.  No spontaneous bleeding  Occupation: Retired, Substance use: None Health Maintenance Due  Topic Date Due   Zoster Vaccines- Shingrix (1 of 2) 02/08/1960   Medicare Annual Wellness (AWV)  02/12/2019   COVID-19 Vaccine (4 - 2024-25 season) 10/20/2023   INFLUENZA VACCINE  03/19/2024   Refills needed today: None except above  Immunization History  Administered Date(s) Administered   Fluad Quad(high Dose 65+) 05/31/2021, 07/30/2021, 05/19/2022   Influenza Split 06/11/2013   Influenza, High Dose Seasonal PF 06/14/2014, 06/03/2017, 05/20/2018   Influenza,inj,Quad PF,6+ Mos 05/19/2015, 05/20/2022   Influenza-Unspecified 05/31/2016, 05/27/2019, 03/30/2020, 04/22/2023   Janssen (J&J) SARS-COV-2 Vaccination 01/25/2020, 07/05/2020   Pfizer(Comirnaty)Fall Seasonal Vaccine 12 years and older 04/22/2023   Pneumococcal Conjugate-13 08/28/2018   Pneumococcal Polysaccharide-23 06/27/2008, 05/19/2015   Respiratory Syncytial Virus Vaccine,Recomb Aduvanted(Arexvy) 08/28/2022   Tdap 04/25/2006, 02/11/2018   Zoster, Live 08/10/2008   Past Medical History:  Diagnosis Date   Arthritis    BPH (benign prostatic hypertrophy)    Carotid artery disease (HCC)    Colon polyp    COPD (chronic obstructive pulmonary disease) (HCC)    Coronary atherosclerosis of native coronary artery    PTCA circumflex at [facility],  nonobstructive residual disease at catheterization June and October 2016   Essential hypertension    GERD (gastroesophageal reflux disease)    History of nephrolithiasis    Mixed hyperlipidemia    Prostate nodule    PVD (peripheral vascular disease) (HCC)    TIA (transient ischemic attack)    Vitamin D  deficiency    Social History   Socioeconomic History   Marital status: Widowed    Spouse name: Not on file   Number of children: Not on file   Years of education: Not on file   Highest education level: Associate degree: occupational, Scientist, product/process development, or vocational program  Occupational History   Not on file  Tobacco Use   Smoking status: Former    Current packs/day: 0.00    Average packs/day: 1.5 packs/day for 60.0 years (90.0 ttl pk-yrs)    Types: Cigarettes    Start date: 02/27/1951    Quit date: 02/27/2011    Years since quitting: 13.0   Smokeless tobacco: Never  Vaping Use   Vaping status: Never Used  Substance and Sexual Activity   Alcohol use: No   Drug use: No   Sexual activity: Not on file  Other Topics Concern   Not on file  Social History Narrative   Not on file   Social Drivers of Health   Financial Resource Strain: Low Risk  (03/22/2024)   Overall Financial Resource Strain (CARDIA)    Difficulty of Paying Living Expenses: Not very hard  Food Insecurity: No Food Insecurity (03/22/2024)   Hunger Vital Sign    Worried About Running Out of Food in the Last Year: Never true  Ran Out of Food in the Last Year: Never true  Transportation Needs: No Transportation Needs (03/22/2024)   PRAPARE - Administrator, Civil Service (Medical): No    Lack of Transportation (Non-Medical): No  Physical Activity: Inactive (03/22/2024)   Exercise Vital Sign    Days of Exercise per Week: 0 days    Minutes of Exercise per Session: Not on file  Stress: No Stress Concern Present (03/22/2024)   [employer] of Occupational Health - Occupational Stress Questionnaire    Feeling  of Stress: Not at all  Social Connections: Moderately Integrated (03/22/2024)   Social Connection and Isolation Panel    Frequency of Communication with Friends and Family: Twice a week    Frequency of Social Gatherings with Friends and Family: Twice a week    Attends Religious Services: More than 4 times per year    Active Member of [employer] or Organizations: Yes    Attends Banker Meetings: More than 4 times per year    Marital Status: Widowed  Intimate Partner Violence: Not on file   Past Surgical History:  Procedure Laterality Date   ABDOMINAL AORTOGRAM W/LOWER EXTREMITY N/A 08/27/2021   Procedure: ABDOMINAL AORTOGRAM W/LOWER EXTREMITY;  Surgeon: Court Dorn PARAS, MD;  Location: [facility] INVASIVE CV LAB;  Service: Cardiovascular;  Laterality: N/A;   ANGIOPLASTY Left 1995   Cir. Artery   APPENDECTOMY  1947   CARDIAC CATHETERIZATION N/A 02/03/2015   Procedure: Left Heart Cath and Cors/Grafts Angiography;  Surgeon: Ozell Fell, MD;  Location: [facility] INVASIVE CV LAB;  Service: Cardiovascular;  Laterality: N/A;   CARDIAC CATHETERIZATION N/A 05/19/2015   Procedure: Left Heart Cath and Coronary Angiography;  Surgeon: Debby DELENA Sor, MD;  Location: [facility] INVASIVE CV LAB;  Service: Cardiovascular;  Laterality: N/A;   COLONOSCOPY N/A 03/19/2018   Procedure: COLONOSCOPY;  Surgeon: Harvey Margo CROME, MD;  Location: [facility] ENDO SUITE;  Service: Endoscopy;  Laterality: N/A;  12:00   EYE SURGERY     Bilateral cataract   POLYPECTOMY  03/19/2018   Procedure: POLYPECTOMY;  Surgeon: Harvey Margo CROME, MD;  Location: [facility] ENDO SUITE;  Service: Endoscopy;;   TONSILLECTOMY  1968   Traumatic injury  1962   Lost tip of index, middle, and ring finger/ right hand   Family History  Problem Relation Age of Onset   Gastric cancer Mother    Varicose Veins Mother    Cancer Mother        Stomach   Prostate cancer Father    Cancer Father        Prostate   Other Brother        carotid artery stenosis   Diabetes Brother     Leukemia Brother    Hyperlipidemia Brother    Hypertension Brother    Colon cancer Brother    Hyperlipidemia Brother    Peripheral vascular disease Brother    Cancer Brother        Colon and Prostate   Hypertension Sister    Hyperlipidemia Sister    Diabetes Daughter    Diabetes Son    Prostate cancer Brother     Current Outpatient Medications:    albuterol  (VENTOLIN  HFA) 108 (90 Base) MCG/ACT inhaler, Inhale 2 puffs into the lungs every 6 (six) hours as needed for wheezing or shortness of breath. (Patient not taking: Reported on 09/18/2023), Disp: 8 g, Rfl: 6   aspirin  EC 81 MG tablet, Take 81 mg by mouth at bedtime. Swallow whole., Disp: ,  Rfl:    carvedilol  (COREG ) 6.25 MG tablet, TAKE 1 TABLET TWICE DAILY WITH MEALS, Disp: 180 tablet, Rfl: 3   cholecalciferol (VITAMIN D3) 25 MCG (1000 UNIT) tablet, Take 1,000 Units by mouth every other day., Disp: , Rfl:    diphenhydramine-acetaminophen  (TYLENOL  PM) 25-500 MG TABS tablet, Take 2 tablets by mouth at bedtime as needed (sleep/pain)., Disp: , Rfl:    fluorouracil  (EFUDEX ) 5 % cream, Apply to lesions on scalp twice daily for 2 to 4 weeks (apply until inflammatory response reaches erosion stage, then stop); complete healing may not be evident for 1 to 2 months following treatment, Disp: 40 g, Rfl: 1   Fluticasone-Umeclidin-Vilant (TRELEGY ELLIPTA ) 100-62.5-25 MCG/ACT AEPB, Inhale 1 puff into the lungs daily., Disp: 180 each, Rfl: 5   hydrochlorothiazide  (HYDRODIURIL ) 25 MG tablet, Take 1 tablet (25 mg total) by mouth daily., Disp: 90 tablet, Rfl: 1   isosorbide  mononitrate (IMDUR ) 30 MG 24 hr tablet, TAKE 1 TABLET TWICE DAILY (NEED MD APPOINTMENT), Disp: 180 tablet, Rfl: 1   Multiple Vitamin (MULTIVITAMIN WITH MINERALS) TABS tablet, Take 1 tablet by mouth at bedtime., Disp: , Rfl:    Multiple Vitamins-Minerals (PRESERVISION AREDS PO), Take 1 tablet by mouth in the morning and at bedtime., Disp: , Rfl:    rosuvastatin  (CRESTOR ) 20 MG  tablet, TAKE 1 TABLET EVERY DAY, Disp: 90 tablet, Rfl: 1   tamsulosin  (FLOMAX ) 0.4 MG CAPS capsule, Take 0.4 mg by mouth daily., Disp: , Rfl:   Current Facility-Administered Medications:    sodium chloride  flush (NS) 0.9 % injection 3 mL, 3 mL, Intravenous, Q12H, Court Dorn PARAS, MD  Allergies  Allergen Reactions   Norvasc  [Amlodipine  Besylate] Swelling and Other (See Comments)    LE swelling     ROS: Review of Systems Pertinent items noted in HPI and remainder of comprehensive ROS otherwise negative.    Physical exam BP (!) 107/59   Pulse 74   Temp 97.8 F (36.6 C)   Ht 5' 10 (1.778 m)   Wt 197 lb 8 oz (89.6 kg)   SpO2 98%   BMI 28.34 kg/m  General appearance: alert, cooperative, appears stated age, and no distress Head: Normocephalic, without obvious abnormality, atraumatic Eyes: negative findings: lids and lashes normal, conjunctivae and sclerae normal, corneas clear, and pupils equal, round, reactive to light and accomodation Ears: normal TM's and external ear canals both ears Nose: Nares normal. Septum midline. Mucosa normal. No drainage or sinus tenderness. Throat: lips, mucosa, and tongue normal; teeth and gums normal Neck: no adenopathy, no carotid bruit, supple, symmetrical, trachea midline, and thyroid  not enlarged, symmetric, no tenderness/mass/nodules Back: symmetric, no curvature. ROM normal. No CVA tenderness. Lungs: Globally decreased breath sounds but normal work of breathing on room air with no wheezes, rhonchi or rales Chest wall: no tenderness Heart: regular rate and rhythm, S1, S2 normal, no murmur, click, rub or gallop Abdomen: soft, non-tender; bowel sounds normal; no masses,  no organomegaly Extremities: extremities normal, atraumatic, no cyanosis or edema Pulses: 2+ and symmetric Skin: Hyperkeratotic lesions x 3 noted on the crown of the scalp primarily on the right side.  These are flesh-colored.  No ulceration or appreciable vascularity.  He has  senile purpura noted on bilateral upper extremities with a healing skin tear noted along the right forearm and right anterior knee Lymph nodes: Cervical, supraclavicular, and axillary nodes normal. Neurologic: Grossly normal      03/23/2024   10:37 AM 02/12/2023   10:30 AM 08/13/2022   10:39  AM  Depression screen PHQ 2/9  Decreased Interest 0 0 0  Down, Depressed, Hopeless 0 0 0  PHQ - 2 Score 0 0 0  Altered sleeping 3 0   Tired, decreased energy 0 3   Change in appetite 0 0   Feeling bad or failure about yourself  0 0   Trouble concentrating 0 0   Moving slowly or fidgety/restless 0 0   Suicidal thoughts 0 0   PHQ-9 Score 3 3   Difficult doing work/chores Not difficult at all        03/23/2024   10:38 AM 02/12/2023   10:31 AM 08/13/2022   10:39 AM 01/28/2022   11:26 AM  GAD 7 : Generalized Anxiety Score  Nervous, Anxious, on Edge 0 0 0 0  Control/stop worrying 0 0 0 0  Worry too much - different things 0 0 0 0  Trouble relaxing 0 0 0 0  Restless 0 0 0 0  Easily annoyed or irritable 0 0 0 0  Afraid - awful might happen 0 0 0 0  Total GAD 7 Score 0 0 0 0  Anxiety Difficulty Not difficult at all Not difficult at all Not difficult at all Not difficult at all   Cryotherapy Procedure:  Risks and benefits of procedure were reviewed with the patient.  Written consent obtained and scanned into the chart.  Lesion of concern was identified and located on crown/ scalp x3.  Liquid nitrogen was applied to area of concern and extending out 1 millimeters beyond the border of the lesion.  Treated area was allowed to come back to room temperature before treating it a second time.  Patient tolerated procedure well and there were no immediate complications.  Home care instructions were reviewed with the patient and a handout was provided.   Assessment/ Plan: Hector Torres here for annual physical exam.   Annual physical exam  Essential hypertension - Plan: CMP14+EGFR  Atherosclerosis of  native coronary artery of native heart with stable angina pectoris (HCC) - Plan: CMP14+EGFR, TSH, Lipid Panel  Mixed hyperlipidemia - Plan: rosuvastatin  (CRESTOR ) 20 MG tablet  PVD (peripheral vascular disease) (HCC) - Plan: CMP14+EGFR, CBC  Renal artery stenosis (HCC) - Plan: CMP14+EGFR  Centrilobular emphysema (HCC) - Plan: CMP14+EGFR, CBC, Fluticasone-Umeclidin-Vilant (TRELEGY ELLIPTA ) 100-62.5-25 MCG/ACT AEPB  Prostate nodule - Plan: CMP14+EGFR, PSA  Actinic keratoses  Purpura senilis (HCC)  Blood pressure well-controlled.  No changes needed.  Not having any orthostasis but should he start developing any of this we can start maybe backing down on his hydrochlorothiazide .  Fasting labs collected today.  He will continue statin as directed for known atherosclerosis of the heart  Check renal function given renal artery stenosis  CBC given emphysema and hand written Rx was provided to the patient today.  Continue to follow-up with pulmonology as directed  PSA repeated today  Actinic keratoses were treated with cryoablation x 2 here in office.  If persistent, will plan for referral to dermatology  Senile purpura likely secondary to chronic use of aspirin .  Counseled on healthy lifestyle choices, including diet (rich in fruits, vegetables and lean meats and low in salt and simple carbohydrates) and exercise (at least 30 minutes of moderate physical activity daily).  Patient to follow up 6 -12months  Paulyne Mooty M. Jolinda, DO

## 2024-03-23 NOTE — Patient Instructions (Signed)
 Seborrheic Keratosis A seborrheic keratosis is a common, noncancerous (benign) skin growth. These growths are velvety, waxy, or rough spots that appear on the skin. They are often tan, brown, or black. The skin growths can be flat or raised and may be scaly. What are the causes? The cause of this condition is not known. What increases the risk? You are more likely to develop this condition if you: Have a family history of seborrheic keratosis. Are 84 years old or older. Are pregnant. Have had estrogen replacement therapy. What are the signs or symptoms? Symptoms of this condition include growths on the face, chest, shoulders, back, or other areas. These growths: Are usually painless, but may become irritated and itchy. Can be tan, yellow, brown, black, or other colors. Are slightly raised or have a flat surface. Are sometimes rough or wart-like in texture. Are often velvety or waxy on the surface. Are round or oval-shaped. Often occur in groups, but may occur as a single growth. How is this diagnosed? This condition is diagnosed with a medical history and physical exam. A sample of the growth may be tested (skin biopsy). You may also need to see a skin specialist (dermatologist). How is this treated? Treatment is not usually needed for this condition unless the growths are irritated or bleed often. You may also choose to have the growths removed if you do not like their appearance. Growth removal may include a procedure in which: Liquid nitrogen is applied to "freeze" off the growth (cryosurgery). This is the most common procedure. The growth is burned off with electricity (electrocautery). The growth is removed by scraping (curettage). Follow these instructions at home: Watch your growth or growths for any changes. Do not scratch or pick at the growth or growths. This can cause them to become irritated or infected. Contact a health care provider if: You suddenly have many new  growths. Your growth bleeds, itches, or hurts. Your growth suddenly becomes larger or changes color. Summary A seborrheic keratosis is a common, noncancerous skin growth. Treatment is not usually needed for this condition unless the growths are irritated or bleed often. Watch your growth or growths for any changes. Contact a health care provider if you suddenly have many new growths or your growth suddenly becomes larger or changes color. This information is not intended to replace advice given to you by your health care provider. Make sure you discuss any questions you have with your health care provider. Document Revised: 10/19/2021 Document Reviewed: 10/19/2021 Elsevier Patient Education  2024 [employer].

## 2024-03-24 ENCOUNTER — Ambulatory Visit: Payer: Self-pay | Admitting: Family Medicine

## 2024-03-24 LAB — CMP14+EGFR
ALT: 15 IU/L (ref 0–44)
AST: 21 IU/L (ref 0–40)
Albumin: 3.7 g/dL (ref 3.7–4.7)
Alkaline Phosphatase: 64 IU/L (ref 44–121)
BUN/Creatinine Ratio: 16 (ref 10–24)
BUN: 17 mg/dL (ref 8–27)
Bilirubin Total: 0.4 mg/dL (ref 0.0–1.2)
CO2: 27 mmol/L (ref 20–29)
Calcium: 9.2 mg/dL (ref 8.6–10.2)
Chloride: 100 mmol/L (ref 96–106)
Creatinine, Ser: 1.08 mg/dL (ref 0.76–1.27)
Globulin, Total: 2.3 g/dL (ref 1.5–4.5)
Glucose: 93 mg/dL (ref 70–99)
Potassium: 4.2 mmol/L (ref 3.5–5.2)
Sodium: 142 mmol/L (ref 134–144)
Total Protein: 6 g/dL (ref 6.0–8.5)
eGFR: 68 mL/min/1.73 (ref >59)

## 2024-03-24 LAB — CBC
Hematocrit: 48.3 % (ref 37.5–51.0)
Hemoglobin: 16.2 g/dL (ref 13.0–17.7)
MCH: 33.9 pg — ABNORMAL HIGH (ref 26.6–33.0)
MCHC: 33.5 g/dL (ref 31.5–35.7)
MCV: 101 fL — ABNORMAL HIGH (ref 79–97)
Platelets: 199 x10E3/uL (ref 150–450)
RBC: 4.78 x10E6/uL (ref 4.14–5.80)
RDW: 12.7 % (ref 11.6–15.4)
WBC: 8.4 x10E3/uL (ref 3.4–10.8)

## 2024-03-24 LAB — LIPID PANEL
Cholesterol, Total: 117 mg/dL (ref 100–199)
HDL: 37 mg/dL — AB (ref >39)
LDL CALC COMMENT:: 3.2 ratio (ref 0.0–5.0)
LDL Chol Calc (NIH): 57 mg/dL (ref 0–99)
Triglycerides: 127 mg/dL (ref 0–149)
VLDL Cholesterol Cal: 23 mg/dL (ref 5–40)

## 2024-03-24 LAB — PSA: Prostate Specific Ag, Serum: 1.5 ng/mL (ref 0.0–4.0)

## 2024-03-24 LAB — TSH: TSH: 2.67 u[IU]/mL (ref 0.450–4.500)

## 2024-04-14 DIAGNOSIS — H524 Presbyopia: Secondary | ICD-10-CM | POA: Diagnosis not present

## 2024-04-27 ENCOUNTER — Ambulatory Visit: Payer: Medicare HMO | Attending: Cardiology

## 2024-04-27 ENCOUNTER — Ambulatory Visit: Payer: Self-pay | Admitting: Cardiology

## 2024-04-27 DIAGNOSIS — I6523 Occlusion and stenosis of bilateral carotid arteries: Secondary | ICD-10-CM

## 2024-05-17 ENCOUNTER — Other Ambulatory Visit: Payer: Self-pay | Admitting: Family Medicine

## 2024-05-17 DIAGNOSIS — E782 Mixed hyperlipidemia: Secondary | ICD-10-CM

## 2024-06-14 ENCOUNTER — Ambulatory Visit: Attending: Cardiology | Admitting: Cardiology

## 2024-06-14 ENCOUNTER — Encounter: Payer: Self-pay | Admitting: Cardiology

## 2024-06-14 VITALS — BP 116/58 | HR 78 | Ht 70.0 in | Wt 202.0 lb

## 2024-06-14 DIAGNOSIS — E782 Mixed hyperlipidemia: Secondary | ICD-10-CM

## 2024-06-14 DIAGNOSIS — I25119 Atherosclerotic heart disease of native coronary artery with unspecified angina pectoris: Secondary | ICD-10-CM

## 2024-06-14 DIAGNOSIS — I10 Essential (primary) hypertension: Secondary | ICD-10-CM | POA: Diagnosis not present

## 2024-06-14 DIAGNOSIS — I6523 Occlusion and stenosis of bilateral carotid arteries: Secondary | ICD-10-CM

## 2024-06-14 NOTE — Patient Instructions (Addendum)

## 2024-06-14 NOTE — Progress Notes (Signed)
    Cardiology Office Note  Date: 06/14/2024   ID: Hector Torres, Hector Torres June 27, 1941, MRN [id number]  History of Present Illness: Hector Torres is an 83 y.o. male last seen in November 2024.  He is here for a follow-up visit.  Reports no angina or increasing dyspnea beyond NYHA class II.  He does exercises for 15 to 20 minutes in the evenings (leg lifts and sit ups).  I reviewed his medications which are stable from a cardiac perspective.  He remains on Crestor  20 mg daily with LDL 57 in August.  I reviewed his ECG today which shows sinus rhythm with frequent unifocal PVCs, inferior axis.  Reports no sense of palpitations, no sudden dizziness or syncope.  Physical Exam: VS:  BP (!) 116/58 (BP Location: Left Arm, Cuff Size: Normal)   Pulse 78   Ht 5' 10 (1.778 m)   Wt 202 lb (91.6 kg)   BMI 28.98 kg/m , BMI Body mass index is 28.98 kg/m.  Wt Readings from Last 3 Encounters:  06/14/24 202 lb (91.6 kg)  03/23/24 197 lb 8 oz (89.6 kg)  09/18/23 201 lb 3.2 oz (91.3 kg)    General: Patient appears comfortable at rest. HEENT: Conjunctiva and lids normal. Neck: Supple, no elevated JVP or carotid bruits. Lungs: Clear to auscultation, nonlabored breathing at rest. Cardiac: Regular rate and rhythm, no S3, 1/6 systolic murmur.  ECG:  An ECG dated 01/02/2023 was personally reviewed today and demonstrated:  Sinus rhythm with PVCs.  Labwork: 03/23/2024: ALT 15; AST 21; BUN 17; Creatinine, Ser 1.08; Hemoglobin 16.2; Platelets 199; Potassium 4.2; Sodium 142; TSH 2.670     Component Value Date/Time   CHOL 117 03/23/2024 1059   CHOL 102 12/25/2012 1251   TRIG 127 03/23/2024 1059   TRIG 82 12/25/2012 1251   HDL 37 (L) 03/23/2024 1059   HDL 36 (L) 12/25/2012 1251   CHOLHDL 3.2 03/23/2024 1059   LDLCALC 57 03/23/2024 1059   LDLCALC 50 12/25/2012 1251   Other Studies Reviewed Today:  No interval cardiac testing for review today.  Assessment and Plan:  1.  CAD status post angioplasty of  the circumflex at [facility] back in [facility] with nonobstructive disease documented at follow-up cardiac catheterization in October 2016.  LVEF 55 to 60% by echocardiogram in November 2022.  Remains symptomatically stable, no angina or increasing dyspnea on exertion with typical activities.  ECG reviewed.  Continue aspirin  81 mg daily, Imdur  30 mg twice daily, and Crestor  20 mg daily.   2.  Primary hypertension.  He has left subclavian artery stenosis, following blood pressure in right arm.  No change in current regimen which includes Coreg  6.25 mg twice daily.   3.  Asymptomatic carotid artery disease.  Carotid Dopplers in September showed stable 40 to 59% RICA stenosis and 60 to 79% LICA stenosis.  Continue aspirin  and statin.   4.  Mixed hyperlipidemia.  LDL 57 in August.  Continue Crestor  20 mg daily.  Disposition:  Follow up 1 year.  Signed, Jayson JUDITHANN Sierras, M.D., F.A.C.C. [facility]

## 2024-06-21 ENCOUNTER — Encounter: Payer: Self-pay | Admitting: Radiology

## 2024-06-21 DIAGNOSIS — H353190 Nonexudative age-related macular degeneration, unspecified eye, stage unspecified: Secondary | ICD-10-CM | POA: Diagnosis not present

## 2024-07-26 ENCOUNTER — Other Ambulatory Visit: Payer: Self-pay | Admitting: Cardiology

## 2024-09-02 ENCOUNTER — Encounter (HOSPITAL_BASED_OUTPATIENT_CLINIC_OR_DEPARTMENT_OTHER): Payer: Self-pay | Admitting: Pulmonary Disease

## 2024-09-02 ENCOUNTER — Ambulatory Visit (HOSPITAL_BASED_OUTPATIENT_CLINIC_OR_DEPARTMENT_OTHER): Admitting: Pulmonary Disease

## 2024-09-02 ENCOUNTER — Ambulatory Visit (HOSPITAL_BASED_OUTPATIENT_CLINIC_OR_DEPARTMENT_OTHER)

## 2024-09-02 VITALS — BP 131/72 | HR 64 | Ht 70.0 in | Wt 198.7 lb

## 2024-09-02 DIAGNOSIS — J439 Emphysema, unspecified: Secondary | ICD-10-CM | POA: Diagnosis not present

## 2024-09-02 DIAGNOSIS — J449 Chronic obstructive pulmonary disease, unspecified: Secondary | ICD-10-CM

## 2024-09-02 DIAGNOSIS — Z87891 Personal history of nicotine dependence: Secondary | ICD-10-CM

## 2024-09-02 DIAGNOSIS — J4489 Other specified chronic obstructive pulmonary disease: Secondary | ICD-10-CM

## 2024-09-02 NOTE — Patient Instructions (Signed)
X CXR today 

## 2024-09-02 NOTE — Progress Notes (Signed)
" ° °  Subjective:    Patient ID: Hector Torres, male    DOB: 06-21-1941, 84 y.o.   MRN: [id number]   84 yo for follow-up of COPD. 10/2021 neurocardiogenic syncope  with PFT maneuver 'vasovagal'   PMH -renal artery stenosis - neg angio CAD -PTCA to left circumflex 1995 -left subclavian stenosis -He smoked more than 90 pack years until he quit in 2012   Discussed the use of AI scribe software for clinical note transcription with the patient, who gave verbal consent to proceed.  History of Present Illness Hector Torres is an 84 year old male with COPD who presents for a one year follow-up.  His breathing is stable with no recent COPD exacerbations or respiratory infections. He no longer uses albuterol  as it was not helpful and does not have a rescue inhaler or nebulizer. He has not had a chest x-ray in over five years.  His medication costs have decreased and he currently has enough COPD medication for the next three to four months.  He has chronic hip pain but is able to walk and manage his activities.  He started daily cardio exercises in August, including leg lifts and sit-ups, to maintain conditioning. He received his flu shot this season.   CXR 2022 wnl  Significant tests/ events reviewed   PFTs 10/2021 severe airway obs, ratio 39, FEV1 2.04/39% ,FVC 71%, TLC nml, DLCO 9.4/38%   CTA angiogram chest 04/2015 emphysema   CT abdomen 06/2021 minimal right lower lobe scarring  Review of Systems  neg for any significant sore throat, dysphagia, itching, sneezing, nasal congestion or excess/ purulent secretions, fever, chills, sweats, unintended wt loss, pleuritic or exertional cp, hempoptysis, orthopnea pnd or change in chronic leg swelling. Also denies presyncope, palpitations, heartburn, abdominal pain, nausea, vomiting, diarrhea or change in bowel or urinary habits, dysuria,hematuria, rash, arthralgias, visual complaints, headache, numbness weakness or ataxia.       Objective:   Physical Exam  Gen. Pleasant, well-nourished, elderly, in no distress ENT - no thrush, no pallor/icterus,no post nasal drip Neck: No JVD, no thyromegaly, no carotid bruits Lungs: no use of accessory muscles, no dullness to percussion,decreased BL without rales or rhonchi  Cardiovascular: Rhythm regular, heart sounds  normal, no murmurs or gallops, no peripheral edema Musculoskeletal: No deformities, no cyanosis or clubbing        Assessment & Plan:   Assessment and Plan Assessment & Plan Chronic obstructive pulmonary disease Severe COPD. No recent exacerbations or chest colds. No use of albuterol  inhaler or nebulizer. No recent chest x-ray in the past five years. - Continue Trelegy. - Ordered chest x-ray today. - Advised to avoid public places during high flu season. - Instructed to call if experiencing yellow-green phlegm or severe cold symptoms.     "

## 2024-09-09 ENCOUNTER — Ambulatory Visit: Payer: Self-pay | Admitting: Pulmonary Disease

## 2025-03-25 ENCOUNTER — Encounter: Payer: Self-pay | Admitting: Family Medicine
